# Patient Record
Sex: Female | Born: 1945 | Race: White | Hispanic: No | Smoking: Former smoker
Health system: Southern US, Community
[De-identification: ages and names within clinical notes are randomized; demographics above are authoritative.]

## PROBLEM LIST (undated history)

## (undated) DIAGNOSIS — J45909 Unspecified asthma, uncomplicated: Secondary | ICD-10-CM

## (undated) DIAGNOSIS — I5189 Other ill-defined heart diseases: Secondary | ICD-10-CM

## (undated) DIAGNOSIS — M810 Age-related osteoporosis without current pathological fracture: Secondary | ICD-10-CM

## (undated) DIAGNOSIS — J9601 Acute respiratory failure with hypoxia: Secondary | ICD-10-CM

## (undated) DIAGNOSIS — J449 Chronic obstructive pulmonary disease, unspecified: Secondary | ICD-10-CM

## (undated) HISTORY — PX: VAGINAL HYSTERECTOMY: SUR661

## (undated) HISTORY — DX: Chronic obstructive pulmonary disease, unspecified: J44.9

---

## 2015-06-24 ENCOUNTER — Ambulatory Visit (INDEPENDENT_AMBULATORY_CARE_PROVIDER_SITE_OTHER): Payer: Medicare Other | Admitting: Pulmonary Disease

## 2015-06-24 ENCOUNTER — Encounter: Payer: Self-pay | Admitting: Pulmonary Disease

## 2015-06-24 VITALS — BP 142/70 | HR 90 | Temp 99.5°F | Ht 63.0 in | Wt 140.6 lb

## 2015-06-24 DIAGNOSIS — Z720 Tobacco use: Secondary | ICD-10-CM

## 2015-06-24 DIAGNOSIS — J449 Chronic obstructive pulmonary disease, unspecified: Secondary | ICD-10-CM | POA: Insufficient documentation

## 2015-06-24 DIAGNOSIS — F17210 Nicotine dependence, cigarettes, uncomplicated: Secondary | ICD-10-CM

## 2015-06-24 DIAGNOSIS — R0600 Dyspnea, unspecified: Secondary | ICD-10-CM

## 2015-06-24 MED ORDER — UMECLIDINIUM BROMIDE 62.5 MCG/INH IN AEPB: 1.0000 | INHALATION_SPRAY | Freq: Every day | RESPIRATORY_TRACT | Status: DC

## 2015-06-24 MED ORDER — UMECLIDINIUM BROMIDE 62.5 MCG/INH IN AEPB: 1.0000 | INHALATION_SPRAY | Freq: Every day | RESPIRATORY_TRACT | Status: AC

## 2015-06-24 NOTE — Patient Instructions (Signed)
[name]-- it was great meeting you today...  Today we reviewed the available records, including your recent CXR report from 06/09/15...  We also checked a pulmonary function test and an ambulatory oximetry test...    They show rather severe obstructive lung disease (a combination of chronic bronchitis and emphysema)  As we discussed> clearly THE MOST IMPORTANT THING IS TO QUIT SMOKING COMPLETELY!!!    Try the OTC nicotine replacement options-- gum, lozenges, patches, etc...    Let me know if you need to try the prescription Chantix medication...  Based on your data> I recommend:    Continue the SYMBICORT160- 2 sprays twice daily...    Add the new INCRUSE one inhalation daily...    Continue the DUONEB medication via Nebulizer 2-3 times daily...  For the AM cough & congestion>    I rec that you start OTC MUCINEX  tabs- take 2 tabs twice daily w/ fluids...  Stay as active as possible, the exercise is GOOD!  Call for any questions...  Let's plan a follow up visit in 2-72mo, sooner if needed for acute problems.Marland KitchenMarland Kitchen

## 2015-06-30 ENCOUNTER — Encounter: Payer: Self-pay | Admitting: Pulmonary Disease

## 2015-06-30 NOTE — Progress Notes (Signed)
Subjective:     Patient ID: Leslie Porter, female   DOB: 03/23/1946, 70 y.o.   MRN: [id number]  HPI ~  June 24, 2015:  Initial pulmonary consult by SN>    70 y/o WF [facility] for a pulmonary evaluation due to COPD>      Dennie Bibleat has been a smoker- starting at age 70, smoked for 55+years now, up to 1+ppd, and most recently down to 1/2ppd for the past few yrs (this adds up to a 60+ pack-year smoking history);  She denies prev history of lung problems- no asthma as a child, rare bout of bronchitis requiring antibiotics in adulthood but no pneumonia, no TB or known exposure, and no known toxic exposures occupationally or otherwise (she still does part-time merchandizing/ delivery);  Dennie Bibleat has noticed SOB for several years along w/ a morning cough, beige sputum, and DOE after min activity and some ADLs (eg-stairs);  She was diagnosed w/ COPD and placed on NEBS and inhalers (currently Symbicort & AlbutHFA).Marland Kitchen.Marland Kitchen.      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   EXAM shows Afeb, VSS, O2sat=92% on RA at rest;  HEENT- neg, mallampati2;  Chest- decr BS bilat w/o w/r/r heard;  Heart- RR w/o m/r/g;  Abd- soft, nontender, neg;  Ext- neg w/o c/c/e;  Neuro- intact...   CXR 06/09/15 at [facility]>  Norm heart size, hyperinflation/ clear lungs/ no nodules, adenopathy, etc/ stable anterior wedging of mid-thoracic vertebral bodies...   Spirometry 06/24/15>  FVC=1.90 (70%), FEV1=0.76 (36%), %1sec=40, mid-flows reduced at 18% predicted;  This is shows a severe obstructive ventilatory defect c/w GOLD Stage 3 COPD  Ambulatory Oximetry 06/24/15>  O2sat=96% on RA at rest  w/ pulse=78/min;  She ambulated in office a total of 3 laps w/ lowest O2sat=89% w/ pulse=102/min...  IMP/PLAN >>    COPD-- suspect emphysema> chronic bronchitis, could eval further w/ FullPFTs & check lung volumes and DLCO, consider CT Chest as well (would serve for further eval for centrilobular emphysema as well as screening for high risk smoker);  In the meanwhile we need to add a long acting anticholinergic (INCRUSE one inhalation daily) to her ICS/LABA (Symbicort160-2spBid) and NEB w/ Duoneb Tid...    Cigarette smoker-- Dennie Bibleat understands that the MOST IMPORTANT thing to do is QUIT SMOKING COMPLETELY; I offered Chantix, encouraged Nicotine replacement options, etc...    Osteoporosis w/ wedging of mult Tspine vertebral bodies and kyphosis-- I encouraged her to get a baseline BMD & proceed on w/ treatment for this progressive 7 sometimes very painful, condition...     Other medical issues per DrBadger & his medical team-- she needs to be sure she is up-to-date on all needed vaccinations (including Pneumovax23 & Prevnar-13)...    Past Medical History  Diagnosis Date  . COPD (chronic obstructive pulmonary disease) [facility](HCC)     Past Surgical History  Procedure Laterality Date  . Vaginal hysterectomy      Outpatient Encounter Prescriptions as of 06/24/2015  Medication Sig  . albuterol (PROVENTIL HFA;VENTOLIN HFA) 108 (90 Base) MCG/ACT inhaler Inhale 1-2 puffs into the lungs every 6 (six) hours as needed for wheezing or shortness of breath.  .Marland Kitchen  budesonide-formoterol (SYMBICORT) 160-4.5 MCG/ACT inhaler Inhale 2 puffs into the lungs 2 (two) times daily.  . Calcium Carbonate (CALCIUM 600 PO) Take 1 tablet by mouth daily.  Marland Kitchen ipratropium-albuterol (DUONEB) 0.5-2.5 (3) MG/3ML SOLN Take 3 mLs by nebulization 2 (two) times daily.  . Multiple Vitamin (MULTIVITAMIN) capsule Take 1 capsule by mouth daily.  . vitamin E 400 UNIT capsule Take 400 Units by mouth daily.    ALLERGIES>  No known drug  allergies...   Immunization History  Administered Date(s) Administered  . Influenza,inj,Quad PF,36+ Mos 02/03/2015  . Zoster 02/27/2014    Family History  Problem Relation Age of Onset  . Heart attack Father   . Heart disease Mother     Social History   Social History  . Marital Status: Divorced    Spouse Name: N/A  . Number of Children: N/A  . Years of Education: N/A   Occupational History  . merchandising     [employer]   Social History Main Topics  . Smoking status: Current Every Day Smoker -- 1.00 packs/day for 50 years    Types: Cigarettes  . Smokeless tobacco: Never Used  . Alcohol Use: 0.0 oz/week    0 Standard drinks or equivalent per week     Comment: Occasional  . Drug Use: No  . Sexual Activity: Not on file   Other Topics Concern  . Not on file   Social History Narrative    Current Medications, Allergies, Past Medical History, Past Surgical History, Family History, and Social History were reviewed in [employer] record.   Review of Systems             All symptoms NEG except where BOLDED >>  Constitutional:  F/C/S, fatigue, anorexia, unexpected weight change. HEENT:  HA, visual changes, hearing loss, earache, nasal symptoms, sore throat, mouth sores, hoarseness. Resp:  cough, sputum, hemoptysis; SOB, tightness, wheezing. Cardio:  CP, palpit, DOE, orthopnea, edema. GI:  N/V/D/C, blood in stool; reflux, abd pain, distention, gas. GU:  dysuria, freq, urgency, hematuria, flank pain, voiding difficulty. MS:  joint pain, swelling, tenderness, decr ROM; neck pain, back pain, etc. Neuro:  HA, tremors, seizures, dizziness, syncope, weakness, numbness, gait abn. Skin:  suspicious lesions or skin rash. Heme:  adenopathy, bruising, bleeding. Psyche:  confusion, agitation, sleep disturbance, hallucinations, anxiety, depression suicidal.   Objective:   Physical Exam       Vital Signs:  Reviewed...  General:  WD, WN, 70  y/o WF in NAD; alert & oriented; pleasant & cooperative... HEENT:  [place]/AT; Conjunctiva- pink, Sclera- nonicteric, EOM-wnl, PERRLA, EACs-clear, TMs-wnl; NOSE-clear; THROAT-clear & wnl. Neck:  Supple w/ fair ROM; no JVD; normal carotid impulses w/o bruits; no thyromegaly or nodules palpated; no lymphadenopathy. Chest:  Decr BS bilat & can't augment the BS voluntarily; no wheezing/ rales/ or rhonchi today... Heart:  Regular Rhythm; norm S1 & S2 without murmurs, rubs, or gallops detected. Abdomen:  Soft & nontender- no guarding or rebound; normal bowel sounds; no organomegaly or masses palpated. Ext:  Normal ROM; without deformities, +arthritic changes; no varicose veins, +venous insuffic, no edema;  Pulses intact w/o bruits. Neuro:  CNs II-XII intact; motor testing normal; sensory testing normal; gait normal & balance OK. Derm:  No lesions noted; no rash etc. Lymph:  No cervical, supraclavicular, axillary, or inguinal adenopathy palpated.   Assessment:      IMP/PLAN >>    COPD-- suspect emphysema> chronic bronchitis, could eval further w/ FullPFTs & check lung volumes and  DLCO, consider CT Chest as well (would serve for further eval for centrilobular emphysema as well as screening for high risk smoker);  In the meanwhile we need to add a long acting anticholinergic (INCRUSE one inhalation daily) to her ICS/LABA (Symbicort160-2spBid) and NEB w/ Duoneb Tid...    Cigarette smoker-- [name] understands that the MOST IMPORTANT thing to do is QUIT SMOKING COMPLETELY; I offered Chantix, encouraged Nicotine replacement options, etc...    Osteoporosis w/ wedging of mult Tspine vertebral bodies and kyphosis-- I encouraged her to get a baseline BMD & proceed on w/ treatment for this progressive 7 sometimes very painful, condition...     Other medical issues per DrBadger & his medical team-- she needs to be sure she is up-to-date on all needed vaccinations (including Pneumovax23 & Prevnar-13)...     Plan:      Patient's Medications  New Prescriptions   UMECLIDINIUM BROMIDE (INCRUSE ELLIPTA) 62.5 MCG/INH AEPB    Inhale 1 puff into the lungs daily.  Previous Medications   ALBUTEROL (PROVENTIL HFA;VENTOLIN HFA) 108 (90 BASE) MCG/ACT INHALER    Inhale 1-2 puffs into the lungs every 6 (six) hours as needed for wheezing or shortness of breath.   BUDESONIDE-FORMOTEROL (SYMBICORT) 160-4.5 MCG/ACT INHALER    Inhale 2 puffs into the lungs 2 (two) times daily.   CALCIUM CARBONATE (CALCIUM 600 PO)    Take 1 tablet by mouth daily.   IPRATROPIUM-ALBUTEROL (DUONEB) 0.5-2.5 (3) MG/3ML SOLN    Take 3 mLs by nebulization 2 (two) times daily.   MULTIPLE VITAMIN (MULTIVITAMIN) CAPSULE    Take 1 capsule by mouth daily.   VITAMIN E 400 UNIT CAPSULE    Take 400 Units by mouth daily.  Modified Medications   No medications on file  Discontinued Medications   No medications on file

## 2015-07-23 ENCOUNTER — Telehealth: Payer: Self-pay | Admitting: Pulmonary Disease

## 2015-07-23 MED ORDER — PREDNISONE 20 MG PO TABS: ORAL_TABLET | ORAL | Status: DC

## 2015-07-23 MED ORDER — LEVOFLOXACIN 500 MG PO TABS: 500.0000 mg | ORAL_TABLET | Freq: Every day | ORAL | Status: DC

## 2015-07-23 NOTE — Telephone Encounter (Signed)
Last ov with SN on 06/24/15  Patient Instructions       Dennie Bibleat-- it was great meeting you today...  Today we reviewed the available records, including your recent CXR report from 06/09/15...  We also checked a pulmonary function test and an ambulatory oximetry test...    They show rather severe obstructive lung disease (a combination of chronic bronchitis and emphysema)  As we discussed> clearly THE MOST IMPORTANT THING IS TO QUIT SMOKING COMPLETELY!!!    Try the OTC nicotine replacement options-- gum, lozenges, patches, etc...    Let me know if you need to try the prescription Chantix medication...  Based on your data> I recommend:    Continue the SYMBICORT160- 2 sprays twice daily...    Add the new INCRUSE one inhalation daily...    Continue the DUONEB medication via Nebulizer 2-3 times daily...  For the AM cough & congestion>    I rec that you start OTC MUCINEX 600mg  tabs- take 2 tabs twice daily w/ fluids...  Stay as active as possible, the exercise is GOOD!  Call for any questions...  Let's plan a follow up visit in 2-2456mo, sooner if needed for acute problems...   Called and spoke with pt. Pt states that she saw SN on 06/24/15. Since that ov her symptoms have worsened. She c/o increased SOB, wheezing, chest congestion, sinus drainage, and a prod cough with clear mucus for the last two days. She denies any fever, chest tightness, sinus congestion, nausea or vomiting. Pt is requesting a message be sent to SN for his recommendations. I explained to her the message will be sent and once we receive his recs we will return her call. She voiced understanding and had no further questions.   SN please advise   Current outpatient prescriptions:  .  albuterol (PROVENTIL HFA;VENTOLIN HFA) 108 (90 Base) MCG/ACT inhaler, Inhale 1-2 puffs into the lungs every 6 (six) hours as needed for wheezing or shortness of breath., Disp: , Rfl:  .  budesonide-formoterol (SYMBICORT) 160-4.5 MCG/ACT inhaler,  Inhale 2 puffs into the lungs 2 (two) times daily., Disp: , Rfl:  .  Calcium Carbonate (CALCIUM 600 PO), Take 1 tablet by mouth daily., Disp: , Rfl:  .  ipratropium-albuterol (DUONEB) 0.5-2.5 (3) MG/3ML SOLN, Take 3 mLs by nebulization 2 (two) times daily., Disp: , Rfl:  .  Multiple Vitamin (MULTIVITAMIN) capsule, Take 1 capsule by mouth daily., Disp: , Rfl:  .  umeclidinium bromide (INCRUSE ELLIPTA) 62.5 MCG/INH AEPB, Inhale 1 puff into the lungs daily., Disp: 1 each, Rfl: 11 .  vitamin E 400 UNIT capsule, Take 400 Units by mouth daily., Disp: , Rfl:    Not on File

## 2015-07-23 NOTE — Telephone Encounter (Signed)
Per SN: Levaquin 500mg  #7 QD, prednisone 20mg  #20, 1po BID x4 days, 1po QD x4 days, then 1/2 QD till gone.  How's she doing on the smoking?  Called spoke with patient and discussed SN's recs as stated above.  Pt voiced her understanding and [facility] [place]  Pt stated that she is still working on decreasing smoking and that she is down to 0.25ppd.    Rx's sent to pharmacy SN notified of pt's attempts at smoking cessation Nothing further needed; will sign off.

## 2015-07-25 ENCOUNTER — Emergency Department (HOSPITAL_COMMUNITY): Payer: Medicare Other

## 2015-07-25 ENCOUNTER — Encounter (HOSPITAL_COMMUNITY): Payer: Self-pay | Admitting: Emergency Medicine

## 2015-07-25 ENCOUNTER — Emergency Department (HOSPITAL_COMMUNITY)
Admission: EM | Admit: 2015-07-25 | Discharge: 2015-07-25 | Disposition: A | Discharge status: Home / Self Care | Payer: Medicare Other | Attending: Emergency Medicine | Admitting: Emergency Medicine

## 2015-07-25 DIAGNOSIS — R42 Dizziness and giddiness: Secondary | ICD-10-CM | POA: Diagnosis not present

## 2015-07-25 DIAGNOSIS — X58XXXA Exposure to other specified factors, initial encounter: Secondary | ICD-10-CM | POA: Insufficient documentation

## 2015-07-25 DIAGNOSIS — T50995A Adverse effect of other drugs, medicaments and biological substances, initial encounter: Secondary | ICD-10-CM | POA: Diagnosis not present

## 2015-07-25 DIAGNOSIS — J441 Chronic obstructive pulmonary disease with (acute) exacerbation: Secondary | ICD-10-CM | POA: Diagnosis not present

## 2015-07-25 DIAGNOSIS — R739 Hyperglycemia, unspecified: Secondary | ICD-10-CM | POA: Insufficient documentation

## 2015-07-25 DIAGNOSIS — Y9389 Activity, other specified: Secondary | ICD-10-CM | POA: Diagnosis not present

## 2015-07-25 DIAGNOSIS — Y998 Other external cause status: Secondary | ICD-10-CM | POA: Diagnosis not present

## 2015-07-25 DIAGNOSIS — Z792 Long term (current) use of antibiotics: Secondary | ICD-10-CM | POA: Diagnosis not present

## 2015-07-25 DIAGNOSIS — Z7951 Long term (current) use of inhaled steroids: Secondary | ICD-10-CM | POA: Insufficient documentation

## 2015-07-25 DIAGNOSIS — M810 Age-related osteoporosis without current pathological fracture: Secondary | ICD-10-CM | POA: Insufficient documentation

## 2015-07-25 DIAGNOSIS — Z79899 Other long term (current) drug therapy: Secondary | ICD-10-CM | POA: Insufficient documentation

## 2015-07-25 DIAGNOSIS — Y9289 Other specified places as the place of occurrence of the external cause: Secondary | ICD-10-CM | POA: Insufficient documentation

## 2015-07-25 DIAGNOSIS — F17210 Nicotine dependence, cigarettes, uncomplicated: Secondary | ICD-10-CM | POA: Insufficient documentation

## 2015-07-25 DIAGNOSIS — T7840XA Allergy, unspecified, initial encounter: Secondary | ICD-10-CM

## 2015-07-25 HISTORY — DX: Age-related osteoporosis without current pathological fracture: M81.0

## 2015-07-25 HISTORY — DX: Unspecified asthma, uncomplicated: J45.909

## 2015-07-25 LAB — CBC WITH DIFFERENTIAL/PLATELET
BASOS ABS: 0 10*3/uL (ref 0.0–0.1)
Basophils Relative: 0 %
EOS PCT: 0 %
Eosinophils Absolute: 0 10*3/uL (ref 0.0–0.7)
HCT: 42.7 % (ref 36.0–46.0)
Hemoglobin: 14.6 g/dL (ref 12.0–15.0)
LYMPHS PCT: 5 %
Lymphs Abs: 0.5 10*3/uL — ABNORMAL LOW (ref 0.7–4.0)
MCH: 33.6 pg (ref 26.0–34.0)
MCHC: 34.2 g/dL (ref 30.0–36.0)
MCV: 98.2 fL (ref 78.0–100.0)
MONO ABS: 0.3 10*3/uL (ref 0.1–1.0)
Monocytes Relative: 3 %
NEUTROS ABS: 8.6 10*3/uL — AB (ref 1.7–7.7)
NEUTROS PCT: 92 %
PLATELETS: 328 10*3/uL (ref 150–400)
RBC: 4.35 MIL/uL (ref 3.87–5.11)
RDW: 12.5 % (ref 11.5–15.5)
WBC: 9.4 10*3/uL (ref 4.0–10.5)

## 2015-07-25 LAB — BASIC METABOLIC PANEL
Anion gap: 11 (ref 5–15)
BUN: 18 mg/dL (ref 6–20)
CHLORIDE: 105 mmol/L (ref 101–111)
CO2: 28 mmol/L (ref 22–32)
CREATININE: 0.67 mg/dL (ref 0.44–1.00)
Calcium: 9.3 mg/dL (ref 8.9–10.3)
GFR calc non Af Amer: >60 mL/min (ref >60)
Glucose, Bld: 166 mg/dL — ABNORMAL HIGH (ref 65–99)
Potassium: 3.9 mmol/L (ref 3.5–5.1)
SODIUM: 144 mmol/L (ref 135–145)

## 2015-07-25 LAB — I-STAT TROPONIN, ED: Troponin i, poc: 0 ng/mL (ref 0.00–0.08)

## 2015-07-25 MED ORDER — EPINEPHRINE 0.3 MG/0.3ML IJ SOAJ: 0.3000 mg | Freq: Once | INTRAMUSCULAR | Status: DC

## 2015-07-25 NOTE — ED Provider Notes (Signed)
CSN: [id number]     Arrival date & time 07/25/15  1359 History   First MD Initiated Contact with Patient 07/25/15 1458     Chief Complaint  Patient presents with  . Allergic Reaction     (Consider location/radiation/quality/duration/timing/severity/associated sxs/prior Treatment) HPI Patient developed feeling of throat closing lightheadedness and tingling in both hands and tingling around her mouth at 9 AM today. She had been complaining of shortness of breath for the past 5 days, typical of her COPD was prescribed prednisone and Levaquin 4 days ago. From urgent care EMS was called. She was treated with a DuoNeb, EpiPen and Benadryl prior to coming here today. Presently her throat feels improved she denies any shortness of breath. She is no longer "dizzy" she does have some tingling in both hands though it is improving steadily with time. Past Medical History  Diagnosis Date  . COPD (chronic obstructive pulmonary disease) (HCC)   . Asthma   . Osteoporosis    Past Surgical History  Procedure Laterality Date  . Vaginal hysterectomy     Family History  Problem Relation Age of Onset  . Heart attack Father   . Heart disease Mother    Social History  Substance Use Topics  . Smoking status: Current Every Day Smoker -- 1.00 packs/day for 50 years    Types: Cigarettes  . Smokeless tobacco: Never Used  . Alcohol Use: 0.0 oz/week    0 Standard drinks or equivalent per week     Comment: Occasional   OB History    No data available     Review of Systems  Constitutional: Negative.   HENT: Negative.        Feeling of throat closing  Respiratory: Positive for shortness of breath.   Cardiovascular: Negative.   Gastrointestinal: Negative.   Musculoskeletal: Negative.   Skin: Negative.   Neurological: Positive for light-headedness.       Tingling around mouth and and hands  Psychiatric/Behavioral: Negative.   All other systems reviewed and are negative.     Allergies   Levofloxacin  Home Medications   Prior to Admission medications   Medication Sig Start Date End Date Taking? Authorizing Provider  albuterol (PROVENTIL HFA;VENTOLIN HFA) 108 (90 Base) MCG/ACT inhaler Inhale 1-2 puffs into the lungs every 6 (six) hours as needed for wheezing or shortness of breath.    Historical Provider, MD  budesonide-formoterol (SYMBICORT) 160-4.5 MCG/ACT inhaler Inhale 2 puffs into the lungs 2 (two) times daily.    Historical Provider, MD  Calcium Carbonate (CALCIUM 600 PO) Take 1 tablet by mouth daily.    Historical Provider, MD  ipratropium-albuterol (DUONEB) 0.5-2.5 (3) MG/3ML SOLN Take 3 mLs by nebulization 2 (two) times daily.    Historical Provider, MD  levofloxacin (LEVAQUIN) 500 MG tablet Take 1 tablet (500 mg total) by mouth daily. 07/23/15   Michele McalpineScott M Nadel, MD  Multiple Vitamin (MULTIVITAMIN) capsule Take 1 capsule by mouth daily.    Historical Provider, MD  predniSONE (DELTASONE) 20 MG tablet Take 1 tablet by mouth twice daily x4 days, then 1 tab once daily x4 days, then 1/2 tab daily until gone. 07/23/15   Michele McalpineScott M Nadel, MD  umeclidinium bromide (INCRUSE ELLIPTA) 62.5 MCG/INH AEPB Inhale 1 puff into the lungs daily. 06/24/15   Michele McalpineScott M Nadel, MD  vitamin E 400 UNIT capsule Take 400 Units by mouth daily.    Historical Provider, MD   BP 141/64 mmHg  Pulse 97  Temp(Src) 98 F (36.7 C) (Oral)  Resp 15  Ht  (1.575 m)  Wt 140 lb (63.504 kg)  BMI 25.60 kg/m2  SpO2 93% Physical Exam  Constitutional: She appears well-developed and well-nourished. No distress.  HENT:  Head: Normocephalic and atraumatic.  Mouth/Throat: Oropharynx is clear and moist.  Eyes: Conjunctivae are normal. Pupils are equal, round, and reactive to light.  Neck: Neck supple. No tracheal deviation present. No thyromegaly present.  Cardiovascular: Normal rate and regular rhythm.   No murmur heard. Pulmonary/Chest: Effort normal. No respiratory distress.  Minimal occasional end expiratory  wheeze  Abdominal: Soft. Bowel sounds are normal. She exhibits no distension. There is no tenderness.  Musculoskeletal: Normal range of motion. She exhibits no edema or tenderness.  Neurological: She is alert. Coordination normal.  Skin: Skin is warm and dry. No rash noted.  Psychiatric: She has a normal mood and affect.  Nursing note and vitals reviewed.   ED Course  Procedures (including critical care time) Labs Review Labs Reviewed - No data to display  Imaging Review No results found. I have personally reviewed and evaluated these images and lab results as part of my medical decision-making.   EKG Interpretation   Date/Time:  Saturday July 25 2015 15:33:29 EDT Ventricular Rate:  97 PR Interval:  147 QRS Duration: 85 QT Interval:  357 QTC Calculation: 453 R Axis:   88 Text Interpretation:  Sinus rhythm Borderline right axis deviation No old  tracing to compare Confirmed by Ethelda Chick  MD, Arietta Eisenstein [phone]) on 07/25/2015  4:25:26 PM     Chest x-ray viewed by me Results for orders placed or performed during the hospital encounter of 07/25/15  Basic metabolic panel  Result Value Ref Range   Sodium 144 135 - 145 mmol/L   Potassium 3.9 3.5 - 5.1 mmol/L   Chloride 105 101 - 111 mmol/L   CO2 28 22 - 32 mmol/L   Glucose, Bld 166 (H) 65 - 99 mg/dL   BUN 18 6 - 20 mg/dL   Creatinine, Ser 9.52 0.44 - 1.00 mg/dL   Calcium 9.3 8.9 - 84.1 mg/dL   GFR calc non Af Amer >60 >60 mL/min   GFR calc Af Amer >60 >60 mL/min   Anion gap 11 5 - 15  CBC with Differential/Platelet  Result Value Ref Range   WBC 9.4 4.0 - 10.5 K/uL   RBC 4.35 3.87 - 5.11 MIL/uL   Hemoglobin 14.6 12.0 - 15.0 g/dL   HCT 32.4 40.1 - 02.7 %   MCV 98.2 78.0 - 100.0 fL   MCH 33.6 26.0 - 34.0 pg   MCHC 34.2 30.0 - 36.0 g/dL   RDW 25.3 66.4 - 40.3 %   Platelets 328 150 - 400 K/uL   Neutrophils Relative % 92 %   Neutro Abs 8.6 (H) 1.7 - 7.7 K/uL   Lymphocytes Relative 5 %   Lymphs Abs 0.5 (L) 0.7 - 4.0 K/uL    Monocytes Relative 3 %   Monocytes Absolute 0.3 0.1 - 1.0 K/uL   Eosinophils Relative 0 %   Eosinophils Absolute 0.0 0.0 - 0.7 K/uL   Basophils Relative 0 %   Basophils Absolute 0.0 0.0 - 0.1 K/uL  I-stat troponin, ED  Result Value Ref Range   Troponin i, poc 0.00 0.00 - 0.08 ng/mL   Comment 3           Dg Chest 2 View  07/25/2015  CLINICAL DATA:  70 year old female with acute shortness of breath today. EXAM: CHEST  2 VIEW  COMPARISON:  None. FINDINGS: The cardiomediastinal silhouette is unremarkable. COPD/emphysema identified. There is no evidence of focal airspace disease, pulmonary edema, suspicious pulmonary nodule/mass, pleural effusion, or pneumothorax. 30% anterior wedge compression fractures of T6 and T7 are age indeterminate but probably remote. IMPRESSION: COPD/emphysema without evidence acute cardiopulmonary disease. Anterior wedge compression fractures of T6 and T7-likely remote but correlate with any pain. Electronically Signed   By: Harmon Pier M.D.   On: 07/25/2015 15:30    4:45 PM patient is able to ambulate in the ED without dyspnea or dizziness. She feels much improved and ready for discharge. Her pulse oximetry is 94% on room air.. Counseled patient for 5 minutes on smoking cessation MDM  Strongly doubt acute thoracic compression fracture. Patient has no back pain. Patient with suspected allergic reaction likely candidate is Levaquin which she started today. She does not need an antibiotic normal chest x-ray  continue prednisone. Prescription EpiPen Final diagnoses:  None   Diagnosis #1allergic reaction to medication #2 hyperglycemia #3 tobacco abuse     Doug Sou, MD 07/25/15 1656

## 2015-07-25 NOTE — ED Notes (Signed)
Pt denies SOB, states her breathing feels normal at this time.

## 2015-07-25 NOTE — ED Notes (Signed)
Bed: WA13 Expected date:  Expected time:  Means of arrival:  Comments: Allergic reaction 

## 2015-07-25 NOTE — Discharge Instructions (Signed)
Allergies Stop Levaquin (levofloxicin) and tell all of your doctors he had an allergic reaction to this medication. If your symptoms recur administer the EpiPen and call 911. Your blood sugar today was 166 which is mildly elevated. Elevated blood sugar may be secondary to taking prednisone. Ask your doctor to check a test known as hemoglobin A1c to check you for diabetes .Ask your primary care doctor to help you to stop smoking. An allergy is an abnormal reaction to a substance by the body's defense system (immune system). Allergies can develop at any age. WHAT CAUSES ALLERGIES? An allergic reaction happens when the immune system mistakenly reacts to a normally harmless substance, called an allergen, as if it were harmful. The immune system releases antibodies to fight the substance. Antibodies eventually release a chemical called histamine into the bloodstream. The release of histamine is meant to protect the body from infection, but it also causes discomfort. An allergic reaction can be triggered by:  Eating an allergen.  Inhaling an allergen.  Touching an allergen. WHAT TYPES OF ALLERGIES ARE THERE? There are many types of allergies. Common types include:  Seasonal allergies. People with this type of allergy are usually allergic to substances that are only present during certain seasons, such as molds and pollens.  Food allergies.  Drug allergies.  Insect allergies.  Animal dander allergies. WHAT ARE SYMPTOMS OF ALLERGIES? Possible allergy symptoms include:  Swelling of the lips, face, tongue, mouth, or throat.  Sneezing, coughing, or wheezing.  Nasal congestion.  Tingling in the mouth.  Rash.  Itching.  Itchy, red, swollen areas of skin (hives).  Watery eyes.  Vomiting.  Diarrhea.  Dizziness.  Lightheadedness.  Fainting.  Trouble breathing or swallowing.  Chest tightness.  Rapid heartbeat. HOW ARE ALLERGIES DIAGNOSED? Allergies are diagnosed with a  medical and family history and one or more of the following:  Skin tests.  Blood tests.  A food diary. A food diary is a record of all the foods and drinks you have in a day and of all the symptoms you experience.  The results of an elimination diet. An elimination diet involves eliminating foods from your diet and then adding them back in one by one to find out if a certain food causes an allergic reaction. HOW ARE ALLERGIES TREATED? There is no cure for allergies, but allergic reactions can be treated with medicine. Severe reactions usually need to be treated at a hospital. HOW CAN REACTIONS BE PREVENTED? The best way to prevent an allergic reaction is by avoiding the substance you are allergic to. Allergy shots and medicines can also help prevent reactions in some cases. People with severe allergic reactions may be able to prevent a life-threatening reaction called anaphylaxis with a medicine given right after exposure to the allergen.   This information is not intended to replace advice given to you by your health care provider. Make sure you discuss any questions you have with your health care provider.   Document Released: 07/05/2002 Document Revised: 05/02/2014 Document Reviewed: 01/21/2014 Elsevier Interactive Patient Education [employer]2016 [employer] Inc.

## 2015-07-25 NOTE — ED Notes (Signed)
Pt in no obvious distress, resting comfortably.

## 2015-07-25 NOTE — ED Notes (Signed)
Presented to her dr office c/o dizziness and feeling like her throat was closing up. Pt started predisone on Thursday and started taking levofloxacin today for respiratory issues. Hx of COPD. Per EMS pt received epi pen, duoneb, and 25mg  benadryl prior to their arrival. EMS vital 144/82 HR 102 RR 18 95% on o2 2lpm. Pt does not wear O2 at home. Pt o2 sats noted to be 85% on room air.

## 2015-07-25 NOTE — ED Notes (Signed)
She ambulates without difficulty and tells Dr. Shela CommonsJ that she is not having any particular difficulty breathing and essentially feels her norm.

## 2015-08-27 ENCOUNTER — Ambulatory Visit: Payer: Medicare Other | Admitting: Pulmonary Disease

## 2015-12-31 ENCOUNTER — Emergency Department (HOSPITAL_COMMUNITY): Payer: Medicare Other

## 2015-12-31 ENCOUNTER — Inpatient Hospital Stay (HOSPITAL_COMMUNITY)
Admission: EM | Admit: 2015-12-31 | Discharge: 2016-01-15 | DRG: 207 | Disposition: A | Discharge status: Home / Self Care | Payer: Medicare Other | Attending: Internal Medicine | Admitting: Internal Medicine

## 2015-12-31 ENCOUNTER — Encounter (HOSPITAL_COMMUNITY): Payer: Self-pay | Admitting: Emergency Medicine

## 2015-12-31 DIAGNOSIS — Z0189 Encounter for other specified special examinations: Secondary | ICD-10-CM

## 2015-12-31 DIAGNOSIS — J440 Chronic obstructive pulmonary disease with acute lower respiratory infection: Secondary | ICD-10-CM | POA: Diagnosis present

## 2015-12-31 DIAGNOSIS — J449 Chronic obstructive pulmonary disease, unspecified: Secondary | ICD-10-CM | POA: Diagnosis not present

## 2015-12-31 DIAGNOSIS — F419 Anxiety disorder, unspecified: Secondary | ICD-10-CM | POA: Diagnosis present

## 2015-12-31 DIAGNOSIS — R739 Hyperglycemia, unspecified: Secondary | ICD-10-CM | POA: Diagnosis not present

## 2015-12-31 DIAGNOSIS — E876 Hypokalemia: Secondary | ICD-10-CM | POA: Diagnosis not present

## 2015-12-31 DIAGNOSIS — R1312 Dysphagia, oropharyngeal phase: Secondary | ICD-10-CM

## 2015-12-31 DIAGNOSIS — J9601 Acute respiratory failure with hypoxia: Secondary | ICD-10-CM | POA: Diagnosis not present

## 2015-12-31 DIAGNOSIS — Z888 Allergy status to other drugs, medicaments and biological substances status: Secondary | ICD-10-CM

## 2015-12-31 DIAGNOSIS — T884XXA Failed or difficult intubation, initial encounter: Secondary | ICD-10-CM

## 2015-12-31 DIAGNOSIS — R000 Tachycardia, unspecified: Secondary | ICD-10-CM | POA: Diagnosis present

## 2015-12-31 DIAGNOSIS — Z515 Encounter for palliative care: Secondary | ICD-10-CM | POA: Diagnosis present

## 2015-12-31 DIAGNOSIS — F17210 Nicotine dependence, cigarettes, uncomplicated: Secondary | ICD-10-CM | POA: Diagnosis present

## 2015-12-31 DIAGNOSIS — I10 Essential (primary) hypertension: Secondary | ICD-10-CM | POA: Diagnosis not present

## 2015-12-31 DIAGNOSIS — J9622 Acute and chronic respiratory failure with hypercapnia: Principal | ICD-10-CM | POA: Diagnosis present

## 2015-12-31 DIAGNOSIS — G9341 Metabolic encephalopathy: Secondary | ICD-10-CM | POA: Diagnosis present

## 2015-12-31 DIAGNOSIS — R609 Edema, unspecified: Secondary | ICD-10-CM | POA: Diagnosis not present

## 2015-12-31 DIAGNOSIS — E878 Other disorders of electrolyte and fluid balance, not elsewhere classified: Secondary | ICD-10-CM | POA: Diagnosis present

## 2015-12-31 DIAGNOSIS — J9621 Acute and chronic respiratory failure with hypoxia: Secondary | ICD-10-CM | POA: Diagnosis present

## 2015-12-31 DIAGNOSIS — D7589 Other specified diseases of blood and blood-forming organs: Secondary | ICD-10-CM | POA: Diagnosis not present

## 2015-12-31 DIAGNOSIS — Z720 Tobacco use: Secondary | ICD-10-CM | POA: Diagnosis not present

## 2015-12-31 DIAGNOSIS — R451 Restlessness and agitation: Secondary | ICD-10-CM | POA: Diagnosis not present

## 2015-12-31 DIAGNOSIS — Y92239 Unspecified place in hospital as the place of occurrence of the external cause: Secondary | ICD-10-CM | POA: Diagnosis present

## 2015-12-31 DIAGNOSIS — M810 Age-related osteoporosis without current pathological fracture: Secondary | ICD-10-CM | POA: Diagnosis present

## 2015-12-31 DIAGNOSIS — J189 Pneumonia, unspecified organism: Secondary | ICD-10-CM | POA: Diagnosis present

## 2015-12-31 DIAGNOSIS — J441 Chronic obstructive pulmonary disease with (acute) exacerbation: Secondary | ICD-10-CM | POA: Diagnosis not present

## 2015-12-31 DIAGNOSIS — Z7189 Other specified counseling: Secondary | ICD-10-CM

## 2015-12-31 DIAGNOSIS — E871 Hypo-osmolality and hyponatremia: Secondary | ICD-10-CM | POA: Diagnosis present

## 2015-12-31 DIAGNOSIS — T8089XA Other complications following infusion, transfusion and therapeutic injection, initial encounter: Secondary | ICD-10-CM | POA: Diagnosis not present

## 2015-12-31 DIAGNOSIS — I959 Hypotension, unspecified: Secondary | ICD-10-CM | POA: Diagnosis not present

## 2015-12-31 DIAGNOSIS — Z7952 Long term (current) use of systemic steroids: Secondary | ICD-10-CM

## 2015-12-31 DIAGNOSIS — J9602 Acute respiratory failure with hypercapnia: Secondary | ICD-10-CM | POA: Diagnosis not present

## 2015-12-31 DIAGNOSIS — E860 Dehydration: Secondary | ICD-10-CM | POA: Diagnosis present

## 2015-12-31 DIAGNOSIS — Z7951 Long term (current) use of inhaled steroids: Secondary | ICD-10-CM

## 2015-12-31 DIAGNOSIS — E872 Acidosis: Secondary | ICD-10-CM | POA: Diagnosis present

## 2015-12-31 DIAGNOSIS — J9600 Acute respiratory failure, unspecified whether with hypoxia or hypercapnia: Secondary | ICD-10-CM

## 2015-12-31 DIAGNOSIS — Z66 Do not resuscitate: Secondary | ICD-10-CM | POA: Diagnosis present

## 2015-12-31 DIAGNOSIS — J9690 Respiratory failure, unspecified, unspecified whether with hypoxia or hypercapnia: Secondary | ICD-10-CM

## 2015-12-31 DIAGNOSIS — Z9981 Dependence on supplemental oxygen: Secondary | ICD-10-CM | POA: Diagnosis not present

## 2015-12-31 DIAGNOSIS — Y848 Other medical procedures as the cause of abnormal reaction of the patient, or of later complication, without mention of misadventure at the time of the procedure: Secondary | ICD-10-CM | POA: Diagnosis present

## 2015-12-31 DIAGNOSIS — J9691 Respiratory failure, unspecified with hypoxia: Secondary | ICD-10-CM

## 2015-12-31 DIAGNOSIS — Z79899 Other long term (current) drug therapy: Secondary | ICD-10-CM

## 2015-12-31 DIAGNOSIS — Z4659 Encounter for fitting and adjustment of other gastrointestinal appliance and device: Secondary | ICD-10-CM

## 2015-12-31 DIAGNOSIS — R0602 Shortness of breath: Secondary | ICD-10-CM | POA: Diagnosis present

## 2015-12-31 DIAGNOSIS — Z7983 Long term (current) use of bisphosphonates: Secondary | ICD-10-CM

## 2015-12-31 DIAGNOSIS — Z978 Presence of other specified devices: Secondary | ICD-10-CM

## 2015-12-31 DIAGNOSIS — Z8249 Family history of ischemic heart disease and other diseases of the circulatory system: Secondary | ICD-10-CM

## 2015-12-31 HISTORY — DX: Acute respiratory failure with hypoxia: J96.01

## 2015-12-31 LAB — BLOOD GAS, ARTERIAL
Acid-Base Excess: 4.3 mmol/L — ABNORMAL HIGH (ref 0.0–2.0)
BICARBONATE: 26.8 mmol/L (ref 20.0–28.0)
Drawn by: 23534
O2 CONTENT: 10 L/min
O2 SAT: 85.3 %
PO2 ART: 55.3 mmHg — AB (ref 83.0–108.0)
pCO2 arterial: 53.8 mmHg — ABNORMAL HIGH (ref 32.0–48.0)
pH, Arterial: 7.357 (ref 7.350–7.450)

## 2015-12-31 LAB — CBC WITH DIFFERENTIAL/PLATELET
BASOS PCT: 0 %
Basophils Absolute: 0 10*3/uL (ref 0.0–0.1)
EOS ABS: 0 10*3/uL (ref 0.0–0.7)
EOS PCT: 0 %
HCT: 44.7 % (ref 36.0–46.0)
Hemoglobin: 15 g/dL (ref 12.0–15.0)
LYMPHS ABS: 1.2 10*3/uL (ref 0.7–4.0)
Lymphocytes Relative: 7 %
MCH: 33.7 pg (ref 26.0–34.0)
MCHC: 33.6 g/dL (ref 30.0–36.0)
MCV: 100.4 fL — ABNORMAL HIGH (ref 78.0–100.0)
Monocytes Absolute: 1.2 10*3/uL — ABNORMAL HIGH (ref 0.1–1.0)
Monocytes Relative: 6 %
Neutro Abs: 15.9 10*3/uL — ABNORMAL HIGH (ref 1.7–7.7)
Neutrophils Relative %: 87 %
PLATELETS: 310 10*3/uL (ref 150–400)
RBC: 4.45 MIL/uL (ref 3.87–5.11)
RDW: 12.4 % (ref 11.5–15.5)
WBC: 18.3 10*3/uL — AB (ref 4.0–10.5)

## 2015-12-31 LAB — BASIC METABOLIC PANEL
Anion gap: 10 (ref 5–15)
BUN: 12 mg/dL (ref 6–20)
CALCIUM: 8.7 mg/dL — AB (ref 8.9–10.3)
CHLORIDE: 97 mmol/L — AB (ref 101–111)
CO2: 27 mmol/L (ref 22–32)
CREATININE: 0.46 mg/dL (ref 0.44–1.00)
GFR calc Af Amer: >60 mL/min (ref >60)
Glucose, Bld: 133 mg/dL — ABNORMAL HIGH (ref 65–99)
Potassium: 4 mmol/L (ref 3.5–5.1)
SODIUM: 134 mmol/L — AB (ref 135–145)

## 2015-12-31 MED ORDER — ADULT MULTIVITAMIN W/MINERALS CH
1.0000 | ORAL_TABLET | Freq: Every day | ORAL | Status: DC
  Administered 2016-01-02: 1 via ORAL
  Filled 2015-12-31: qty 1

## 2015-12-31 MED ORDER — IOPAMIDOL (ISOVUE-370) INJECTION 76%
100.0000 mL | Freq: Once | INTRAVENOUS | Status: AC | PRN
  Administered 2015-12-31: 100 mL via INTRAVENOUS

## 2015-12-31 MED ORDER — ONDANSETRON HCL 4 MG/2ML IJ SOLN: 4.0000 mg | Freq: Four times a day (QID) | INTRAMUSCULAR | Status: DC | PRN

## 2015-12-31 MED ORDER — UMECLIDINIUM BROMIDE 62.5 MCG/INH IN AEPB
1.0000 | INHALATION_SPRAY | Freq: Every day | RESPIRATORY_TRACT | Status: DC
  Filled 2015-12-31: qty 7

## 2015-12-31 MED ORDER — ACETAMINOPHEN 325 MG PO TABS
650.0000 mg | ORAL_TABLET | Freq: Four times a day (QID) | ORAL | Status: DC | PRN
  Filled 2015-12-31: qty 2

## 2015-12-31 MED ORDER — DEXTROSE 5 % IV SOLN
1.0000 g | INTRAVENOUS | Status: AC
  Administered 2015-12-31 – 2016-01-06 (×7): 1 g via INTRAVENOUS
  Filled 2015-12-31 (×9): qty 10

## 2015-12-31 MED ORDER — AZITHROMYCIN 500 MG IV SOLR
INTRAVENOUS | Status: AC
  Filled 2015-12-31: qty 500

## 2015-12-31 MED ORDER — LORAZEPAM 2 MG/ML IJ SOLN
1.0000 mg | INTRAMUSCULAR | Status: DC | PRN
  Administered 2015-12-31 – 2016-01-01 (×3): 1 mg via INTRAVENOUS
  Filled 2015-12-31 (×2): qty 1

## 2015-12-31 MED ORDER — SODIUM CHLORIDE 0.9 % IV SOLN
INTRAVENOUS | Status: DC
  Administered 2015-12-31: 22:00:00 via INTRAVENOUS

## 2015-12-31 MED ORDER — LORAZEPAM 2 MG/ML IJ SOLN
INTRAMUSCULAR | Status: AC
  Filled 2015-12-31: qty 1

## 2015-12-31 MED ORDER — GUAIFENESIN ER 600 MG PO TB12: 600.0000 mg | ORAL_TABLET | Freq: Two times a day (BID) | ORAL | Status: DC | PRN

## 2015-12-31 MED ORDER — IPRATROPIUM-ALBUTEROL 0.5-2.5 (3) MG/3ML IN SOLN
3.0000 mL | RESPIRATORY_TRACT | Status: DC | PRN
  Administered 2016-01-01 – 2016-01-03 (×2): 3 mL via RESPIRATORY_TRACT
  Filled 2015-12-31 (×2): qty 3

## 2015-12-31 MED ORDER — AZITHROMYCIN 500 MG IV SOLR
500.0000 mg | INTRAVENOUS | Status: AC
  Administered 2015-12-31 – 2016-01-07 (×7): 500 mg via INTRAVENOUS
  Filled 2015-12-31 (×10): qty 500

## 2015-12-31 MED ORDER — NICOTINE 21 MG/24HR TD PT24
21.0000 mg | MEDICATED_PATCH | Freq: Every day | TRANSDERMAL | Status: DC
  Administered 2015-12-31 – 2016-01-15 (×16): 21 mg via TRANSDERMAL
  Filled 2015-12-31 (×16): qty 1

## 2015-12-31 MED ORDER — HYDROCODONE-ACETAMINOPHEN 5-325 MG PO TABS: 1.0000 | ORAL_TABLET | ORAL | Status: DC | PRN

## 2015-12-31 MED ORDER — IPRATROPIUM-ALBUTEROL 0.5-2.5 (3) MG/3ML IN SOLN
3.0000 mL | Freq: Once | RESPIRATORY_TRACT | Status: AC
  Administered 2015-12-31: 3 mL via RESPIRATORY_TRACT
  Filled 2015-12-31: qty 3

## 2015-12-31 MED ORDER — MOMETASONE FURO-FORMOTEROL FUM 200-5 MCG/ACT IN AERO
2.0000 | INHALATION_SPRAY | Freq: Two times a day (BID) | RESPIRATORY_TRACT | Status: DC
  Filled 2015-12-31: qty 8.8

## 2015-12-31 MED ORDER — DEXTROSE 5 % IV SOLN
INTRAVENOUS | Status: AC
  Filled 2015-12-31: qty 10

## 2015-12-31 MED ORDER — ENOXAPARIN SODIUM 40 MG/0.4ML ~~LOC~~ SOLN
40.0000 mg | SUBCUTANEOUS | Status: DC
  Administered 2015-12-31 – 2016-01-14 (×15): 40 mg via SUBCUTANEOUS
  Filled 2015-12-31 (×15): qty 0.4

## 2015-12-31 MED ORDER — CALCIUM CARBONATE 1250 (500 CA) MG PO TABS
1250.0000 mg | ORAL_TABLET | Freq: Every day | ORAL | Status: DC
  Administered 2016-01-02: 1250 mg via ORAL
  Filled 2015-12-31 (×3): qty 1

## 2015-12-31 MED ORDER — METHYLPREDNISOLONE SODIUM SUCC 125 MG IJ SOLR
60.0000 mg | Freq: Four times a day (QID) | INTRAMUSCULAR | Status: DC
  Administered 2015-12-31 – 2016-01-01 (×2): 60 mg via INTRAVENOUS
  Filled 2015-12-31 (×2): qty 2

## 2015-12-31 NOTE — H&P (Signed)
History and Physical    Leslie Porter WGN:[id number] DOB: 19-Dec-1945 DOA: 12/31/2015  PCP: [name], MD   Patient coming from: Home  Chief Complaint: Respiratory distress   HPI: Leslie Porter is a 70 y.o. female with medical history significant for COPD, tobacco abuse, and osteoporosis who presents to the emergency department for evaluation of worsening respiratory distress. Patient reports pain in her usual state of health until she developed increased dyspnea with cough approximately one week ago. Her symptoms continued to worsen despite her increasingly frequent use of home nebulizer. She saw her PCP on 12/30/2015 for evaluation of these complaints and was given an IM dose of Rocephin, started on a prednisone taper, and started on 3 L per minute of supplemental oxygen. EMS was called out yesterday but she refused transport to the ED at that time. She saw her PCP again today and received a second IM dose of Rocephin. EMS was again called about this evening for worsening respiratory distress and found the patient to be tripoding on their arrival. She was treated with 125 mg of Solu-Medrol and a DuoNeb en route to the ED. She denies recent fevers or chills, but notes worsening cough, and describes a sensation of needing to cough up some phlegm, but being unable to. She denies any chest pain, palpitations, lower extremity edema, or orthopnea.  ED Course: Upon arrival to the ED, patient is found to be afebrile, saturating 87% on 7 L to lose supplemental oxygen, tachypneic in the high 20s, tachycardic in the mid 30s, and hypertensive to 170s/70s. EKG revealed a sinus tachycardia with rate 138, right axis deviation, and RSR'in V1. Chest x-ray was notable for a increased prominence at the right hilum concerning for possible mass or lymphadenopathy. Chemistry panel featured a mild hypochloremia and hyponatremia. CBC is most notable for a leukocytosis to 18,300 and ABG features a pH 7.36, pCO2 54,  and pO2 55. CTA PE study was performed and negative for PE, but notable for consolidation involving the right middle lobe with no definite obstructing mass identified. CT findings are consistent with underlying emphysema. Patient was treated with DuoNeb in the emergency department and placed on BiPAP. She remained hemodynamically stable but continues to be in significant respiratory distress and will be admitted to the stepdown unit for ongoing evaluation and management of COPD with acute exacerbation, acute respiratory failure, and possible CAP.  Review of Systems:  All other systems reviewed and apart from HPI, are negative.  Past Medical History:  Diagnosis Date  . Asthma   . COPD (chronic obstructive pulmonary disease) (HCC)   . Osteoporosis     Past Surgical History:  Procedure Laterality Date  . VAGINAL HYSTERECTOMY       reports that she has been smoking Cigarettes.  She has a 50.00 pack-year smoking history. She has never used smokeless tobacco. She reports that she drinks alcohol. She reports that she does not use drugs.  Allergies  Allergen Reactions  . Levofloxacin Shortness Of Breath    Family History  Problem Relation Age of Onset  . Heart attack Father   . Heart disease Mother      Prior to Admission medications   Medication Sig Start Date End Date Taking? Authorizing Provider  albuterol (PROVENTIL HFA;VENTOLIN HFA) 108 (90 Base) MCG/ACT inhaler Inhale 1-2 puffs into the lungs every 6 (six) hours as needed for wheezing or shortness of breath.   Yes Historical Provider, MD  alendronate (FOSAMAX) 70 MG tablet Take 70 mg by mouth  every Sunday.  12/29/15  Yes Historical Provider, MD  budesonide-formoterol (SYMBICORT) 160-4.5 MCG/ACT inhaler Inhale 2 puffs into the lungs 2 (two) times daily.   Yes Historical Provider, MD  Calcium Carbonate (CALCIUM 600 PO) Take 1 tablet by mouth daily.   Yes Historical Provider, MD  cefTRIAXone (ROCEPHIN) 10 g injection Inject into the  muscle once.  12/31/15 12/31/15 Yes Historical Provider, MD  ipratropium-albuterol (DUONEB) 0.5-2.5 (3) MG/3ML SOLN Take 3 mLs by nebulization 2 (two) times daily.   Yes Historical Provider, MD  Multiple Vitamin (MULTIVITAMIN) capsule Take 1 capsule by mouth daily.   Yes Historical Provider, MD  predniSONE (DELTASONE) 20 MG tablet Take 1 tablet by mouth twice daily x4 days, then 1 tab once daily x4 days, then 1/2 tab daily until gone. Patient taking differently: Take 40 mg by mouth daily. 5 day course starting on 12/30/2015 07/23/15  Yes Michele Mcalpine, MD  umeclidinium bromide (INCRUSE ELLIPTA) 62.5 MCG/INH AEPB Inhale 1 puff into the lungs daily. 06/24/15  Yes Michele Mcalpine, MD  vitamin E 400 UNIT capsule Take 400 Units by mouth daily.   Yes Historical Provider, MD  EPINEPHrine (EPIPEN 2-PAK) 0.3 mg/0.3 mL IJ SOAJ injection Inject 0.3 mLs (0.3 mg total) into the muscle once. 07/25/15   Doug Sou, MD    Physical Exam: Vitals:   12/31/15 2008 12/31/15 2017 12/31/15 2030 12/31/15 2100  BP: 122/67  119/64 133/63  Pulse: (!) 137  (!) 123 (!) 128  Resp: (!) 28  (!) 29 26  Temp:      TempSrc:      SpO2: 94%  98% 98%  Weight:  65.8 kg (145 lb)    Height:  5\' 3"  (1.6 m)        Constitutional: In acute respiratory distress with tachypnea and accessory muscle recruitment; on BiPAP Eyes: PERTLA, lids and conjunctivae normal ENMT: Mucous membranes are moist. Posterior pharynx clear of any exudate or lesions.   Neck: normal, supple, no masses, no thyromegaly Respiratory: Diminished bilaterally with b/l expiratory wheezes and central rhonchi. Increased WOB. No pallor or cyanosis.   Cardiovascular: Rate ~120 and regular. No extremity edema. No carotid bruits.   Abdomen: No distension, no tenderness, no masses palpated. Bowel sounds normal.  Musculoskeletal: no clubbing / cyanosis. No joint deformity upper and lower extremities. Normal muscle tone.  Skin: no significant rashes, lesions, ulcers. Warm, dry,  well-perfused. Neurologic: CN 2-12 grossly intact. Sensation intact, DTR normal. Strength 5/5 in all 4 limbs.  Psychiatric: Normal judgment and insight. Alert and oriented x 3. Normal mood and affect.     Labs on Admission: I have personally reviewed following labs and imaging studies  CBC:  Recent Labs Lab 12/31/15 1711  WBC 18.3*  NEUTROABS 15.9*  HGB 15.0  HCT 44.7  MCV 100.4*  PLT 310   Basic Metabolic Panel:  Recent Labs Lab 12/31/15 1711  NA 134*  K 4.0  CL 97*  CO2 27  GLUCOSE 133*  BUN 12  CREATININE 0.46  CALCIUM 8.7*   GFR: Estimated Creatinine Clearance: 58.9 mL/min (by C-G formula based on SCr of 0.8 mg/dL). Liver Function Tests: No results for input(s): AST, ALT, ALKPHOS, BILITOT, PROT, ALBUMIN in the last 168 hours. No results for input(s): LIPASE, AMYLASE in the last 168 hours. No results for input(s): AMMONIA in the last 168 hours. Coagulation Profile: No results for input(s): INR, PROTIME in the last 168 hours. Cardiac Enzymes: No results for input(s): CKTOTAL, CKMB, CKMBINDEX, TROPONINI in  the last 168 hours. BNP (last 3 results) No results for input(s): PROBNP in the last 8760 hours. HbA1C: No results for input(s): HGBA1C in the last 72 hours. CBG: No results for input(s): GLUCAP in the last 168 hours. Lipid Profile: No results for input(s): CHOL, HDL, LDLCALC, TRIG, CHOLHDL, LDLDIRECT in the last 72 hours. Thyroid Function Tests: No results for input(s): TSH, T4TOTAL, FREET4, T3FREE, THYROIDAB in the last 72 hours. Anemia Panel: No results for input(s): VITAMINB12, FOLATE, FERRITIN, TIBC, IRON, RETICCTPCT in the last 72 hours. Urine analysis: No results found for: COLORURINE, APPEARANCEUR, LABSPEC, PHURINE, GLUCOSEU, HGBUR, BILIRUBINUR, KETONESUR, PROTEINUR, UROBILINOGEN, NITRITE, LEUKOCYTESUR Sepsis Labs: @LABRCNTIP (procalcitonin:4,lacticidven:4) )No results found for this or any previous visit (from the past 240 hour(s)).    Radiological Exams on Admission: Ct Angio Chest Pe W Or Wo Contrast  Result Date: 12/31/2015 CLINICAL DATA:  70 year old female with dyspnea and tachycardia. Mass seen on chest radiograph. EXAM: CT ANGIOGRAPHY CHEST WITH CONTRAST TECHNIQUE: Multidetector CT imaging of the chest was performed using the standard protocol during bolus administration of intravenous contrast. Multiplanar CT image reconstructions and MIPs were obtained to evaluate the vascular anatomy. CONTRAST:  100 cc Isovue 370 COMPARISON:  Chest radiograph dated 12/31/2015 FINDINGS: Evaluation of this exam is limited due to respiratory motion artifact. There is emphysematous changes of the lungs. There is near complete consolidative changes of the medial segment of the right middle lobe, likely atelectasis versus infiltrate. The remainder of the lungs are clear. There is no pleural effusion or pneumothorax. The central airways are patent. There is atherosclerotic calcification of the thoracic aorta. There is no aneurysmal dilatation or evidence of dissection. The origins of the great vessels of the aortic arch appear patent. Evaluation of the pulmonary arteries is limited due to respiratory motion artifact and suboptimal opacification of the peripheral branches. No definite central pulmonary artery embolus identified. There is no cardiomegaly or pericardial effusion. There is coronary vascular calcification. A 10 mm hypodense structure in the right hilar region (series 6, image 71) may represent hilar tissue or a top-normal lymph node. Mildly enlarged lymph node noted anterior to the carina which demonstrates fatty hilum. The esophagus is grossly unremarkable. No thyroid nodule identified. There is no axillary adenopathy. The chest wall soft tissues appear unremarkable. The there is osteopenia with degenerative changes of the spine. Old-appearing T6 an T7 compression deformity with anterior wedging resulting in thoracic kyphosis. 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 No acute fracture identified. The visualized upper abdomen is unremarkable. Review of the MIP images confirms the above findings. IMPRESSION: No CT evidence of central pulmonary artery embolus. Segmental consolidative changes of the right middle lobe. No definite obstructing mass identified. Clinical correlation and follow-up recommended. Emphysema. Electronically Signed   By: Elgie CollardArash  Radparvar M.D.   On: 12/31/2015 20:11   Dg Chest Portable 1 View  Result Date: 12/31/2015 CLINICAL DATA:  Severe SOB today. Hx of asthma, COPD. Everyday smoker x1ppd x50 years. EXAM: PORTABLE CHEST 1 VIEW COMPARISON:  07/25/2015 FINDINGS: Atherosclerotic calcification of the aortic arch. Abnormal fullness of the right hilum. Suspected underlying emphysema. Interstitial accentuation in the lung bases. Mild biapical pleuroparenchymal scarring. Vague nodularity at the left lung base may relate to the adjacent rib the hand. Airway thickening is present, suggesting bronchitis or reactive airways disease. IMPRESSION: 1. Abnormal and increased prominence the right hilum. Hilar mass or adenopathy not excluded. CT chest recommended, with contrast if feasible. 2. Suspected emphysema. 3. Airway thickening is present, suggesting bronchitis or reactive airways disease.  Interstitial accentuation at the lung bases. Electronically Signed   By: Gaylyn Rong M.D.   On: 12/31/2015 18:40    EKG: Independently reviewed. Sinus tachycardia (rate 138), RAD, RSR' in V1  Assessment/Plan  1. Acute respiratory failure with hypoxia and hypercarbia  - Saturating only 87% initially despite 7 Lpm of supplemental oxygen  - CTA negative for PE, but notable for possible RML PNA vs atelectasis  - Treating acute COPD exacerbation and possible CAP as below    2. COPD with acute exacerbation  - Hx of emphysema and >60 pack-yr smoking hx  - Had not required supplemental O2 at home until the current illness and was started on 3 Lpm on 12/29/15    - Presents with severe dyspnea at rest, increased cough, but no production to cough (though pt feels she needs to cough something up) - Currently requiring BiPAP, will admit to stepdown unit and wean from BiPAP as able  - Continue systemic steroid with Solu-Medrol 60 mg IV q6h  - Continue DuoNeb q2h prn  - Continue her home inhalers, Symbicort and Incruse Ellipta  - Abx as below  - Smoking-cessation as below  - Continuous pulse oximetry with titration of FiO2 to maintain sat 89-95%   3. ?CAP  - PNA versus atelectasis noted in RML on CTA  - There is no fever; WBC elevated, though she has been on prednisone  - Given her critical illness, will cover with Rocephin and azithromycin  - Check sputum gram stain and culture; check urine for strep pneumo or legionella antigens   4. Current smoker  - Counseled toward cessation  - Nicotine patch provided - RN asked to provide smoking-cessation information prior to discharge   5. Hyponatremia, hypochloremia  - Mild, likely d/t dehydration in setting of acute respiratory failure and possible CAP - Providing a gentle IV hydration with NS overnight - Repeat chem panel in am     DVT prophylaxis: sq Lovenox Code Status: Full  Family Communication: Discussed with patient Disposition Plan: Admit to stepdown Consults called: None  Admission status: Inpatient    Briscoe Deutscher, MD [facility] Pager [phone]  If 7PM-7AM, please contact night-coverage [email or web address] Password [email or web address]  12/31/2015, 9:25 PM

## 2015-12-31 NOTE — Progress Notes (Signed)
Respiratory  Care Note: Started the patient on a breathing treatment per MD order. Prior to treatment the patient had very distant breath sounds (BS). BS improved post treatment and wheezes was then noted to indicate some increase of air flow. The patient also stated that " I feel like I have a little more air and that if I could just cough some stuff up ." Sat at the patient's bedside to monitor and to ensure that we do our very best to help her feel better and to help her through this. I also talked to her about the possible plans of treatment such as Continuous Aerosol Treat, ABG and/or the use of BIPAP and explain it's purpose and how it works. I will continue to monitor her closely.

## 2015-12-31 NOTE — ED Triage Notes (Signed)
Per EMS: Pt having difficulty breathing, 5 mg albuterol en route, solumedrol, pt 93% on NRB.  Pt was in tripod position prior to transport in respiratory distress. Pt called 911 yesterday for same but refused transfer to facility.  Pt speaking in full complete sentences at this time.  Pt has copd, she saw doctor today and received steroid injection.  Pt 94% on 3L 02 at this time.

## 2015-12-31 NOTE — ED Provider Notes (Signed)
[facility]Y DEPT Provider Note   CSN: [id number] Arrival date & time: 12/31/15  1656     History   Chief Complaint Chief Complaint  Patient presents with  . Shortness of Breath  Level V caveat, acuity of situation dyspnea  HPI Khiya Friese is a 70 y.o. female. Patient has become progressively more short of breath over the past 4-5 days. She feels this is exacerbation of her COPD. She's been seen by her primary care physician twice received 2 different injections of antibiotics and also started on prednisone. She is also been started on home oxygen 3 L nasal cannula over the past few days Breathing became worse this afternoon. She was treated by EMS with Solu-Medrol 125 mg IV and   nebulized treatment pain albuterol 5 mg and Atrovent 2.5 mg, and Place patient on nonrebreather mask with improvement of breathing. She denies chest pain denies fever denies cough no other associated symptoms HPI  Past Medical History:  Diagnosis Date  . Asthma   . COPD (chronic obstructive pulmonary disease) (HCC)   . Osteoporosis     Patient Active Problem List   Diagnosis Date Noted  . COPD mixed type (HCC) 06/24/2015  . Cigarette smoker 06/24/2015  . Dyspnea 06/24/2015    Past Surgical History:  Procedure Laterality Date  . VAGINAL HYSTERECTOMY      OB History    No data available       Home Medications    Prior to Admission medications   Medication Sig Start Date End Date Taking? Authorizing Provider  albuterol (PROVENTIL HFA;VENTOLIN HFA) 108 (90 Base) MCG/ACT inhaler Inhale 1-2 puffs into the lungs every 6 (six) hours as needed for wheezing or shortness of breath.    Historical Provider, MD  budesonide-formoterol (SYMBICORT) 160-4.5 MCG/ACT inhaler Inhale 2 puffs into the lungs 2 (two) times daily.    Historical Provider, MD  Calcium Carbonate (CALCIUM 600 PO) Take 1 tablet by mouth daily.    Historical Provider, MD  EPINEPHrine (EPIPEN 2-PAK) 0.3 mg/0.3 mL IJ SOAJ injection  Inject 0.3 mLs (0.3 mg total) into the muscle once. 07/25/15   Doug Sou, MD  ipratropium-albuterol (DUONEB) 0.5-2.5 (3) MG/3ML SOLN Take 3 mLs by nebulization 2 (two) times daily.    Historical Provider, MD  Multiple Vitamin (MULTIVITAMIN) capsule Take 1 capsule by mouth daily.    Historical Provider, MD  predniSONE (DELTASONE) 20 MG tablet Take 1 tablet by mouth twice daily x4 days, then 1 tab once daily x4 days, then 1/2 tab daily until gone. 07/23/15   Michele Mcalpine, MD  umeclidinium bromide (INCRUSE ELLIPTA) 62.5 MCG/INH AEPB Inhale 1 puff into the lungs daily. 06/24/15   Michele Mcalpine, MD  vitamin E 400 UNIT capsule Take 400 Units by mouth daily.    Historical Provider, MD    Family History Family History  Problem Relation Age of Onset  . Heart attack Father   . Heart disease Mother     Social History Social History  Substance Use Topics  . Smoking status: Current Every Day Smoker    Packs/day: 1.00    Years: 50.00    Types: Cigarettes  . Smokeless tobacco: Never Used  . Alcohol use 0.0 oz/week     Comment: Occasional     Allergies   Levofloxacin   Review of Systems Review of Systems  Unable to perform ROS: Acuity of condition  Respiratory: Positive for shortness of breath.   All other systems reviewed and are negative.  Physical Exam Updated Vital Signs BP 174/71 (BP Location: Right Arm)   Pulse (!) 135   Temp 99.6 F (37.6 C) (Rectal)   Resp (!) 27   SpO2 (!) 87%   Physical Exam  Constitutional: She appears well-developed and well-nourished. She appears distressed.  Moderate respiratory distress speaks inshort sentences  HENT:  Head: Normocephalic and atraumatic.  Eyes: Conjunctivae are normal. Pupils are equal, round, and reactive to light.  Neck: Neck supple. No tracheal deviation present. No thyromegaly present.  Cardiovascular: Normal rate and regular rhythm.   No murmur heard. Pulmonary/Chest: She is in respiratory distress.  diminished breath  sounds diffusely  Abdominal: Soft. Bowel sounds are normal. She exhibits no distension. There is no tenderness.  Musculoskeletal: Normal range of motion. She exhibits no edema or tenderness.  Neurological: She is alert. Coordination normal.  Skin: Skin is warm and dry. No rash noted.  Psychiatric: She has a normal mood and affect.  Nursing note and vitals reviewed.    ED Treatments / Results  Labs (all labs ordered are listed, but only abnormal results are displayed) Labs Reviewed  BASIC METABOLIC PANEL  CBC WITH DIFFERENTIAL/PLATELET    EKG  EKG Interpretation  Date/Time:  Thursday December 31 2015 17:03:46 EDT Ventricular Rate:  138 PR Interval:    QRS Duration: 88 QT Interval:  299 QTC Calculation: 453 R Axis:   95 Text Interpretation:  Sinus tachycardia Right axis deviation RSR' in V1 or V2, probably normal variant Artifact in lead(s) I II III aVR aVL aVF V1 V2 V3 V4 V5 V6 SINCE LAST TRACING HEART RATE HAS INCREASED Confirmed by Ethelda ChickJACUBOWITZ  MD, Latora Quarry [phone]([id number]) on 12/31/2015 5:21:14 PM     Chest x-ray viewed by me Results for orders placed or performed during the hospital encounter of 12/31/15  Basic metabolic panel  Result Value Ref Range   Sodium 134 (L) 135 - 145 mmol/L   Potassium 4.0 3.5 - 5.1 mmol/L   Chloride 97 (L) 101 - 111 mmol/L   CO2 27 22 - 32 mmol/L   Glucose, Bld 133 (H) 65 - 99 mg/dL   BUN 12 6 - 20 mg/dL   Creatinine, Ser 1.910.46 0.44 - 1.00 mg/dL   Calcium 8.7 (L) 8.9 - 10.3 mg/dL   GFR calc non Af Amer >60 >60 mL/min   GFR calc Af Amer >60 >60 mL/min   Anion gap 10 5 - 15  CBC with Differential/Platelet  Result Value Ref Range   WBC 18.3 (H) 4.0 - 10.5 K/uL   RBC 4.45 3.87 - 5.11 MIL/uL   Hemoglobin 15.0 12.0 - 15.0 g/dL   HCT 47.844.7 29.536.0 - 62.146.0 %   MCV 100.4 (H) 78.0 - 100.0 fL   MCH 33.7 26.0 - 34.0 pg   MCHC 33.6 30.0 - 36.0 g/dL   RDW 30.812.4 65.711.5 - 84.615.5 %   Platelets 310 150 - 400 K/uL   Neutrophils Relative % 87 %   Neutro Abs 15.9 (H) 1.7 -  7.7 K/uL   Lymphocytes Relative 7 %   Lymphs Abs 1.2 0.7 - 4.0 K/uL   Monocytes Relative 6 %   Monocytes Absolute 1.2 (H) 0.1 - 1.0 K/uL   Eosinophils Relative 0 %   Eosinophils Absolute 0.0 0.0 - 0.7 K/uL   Basophils Relative 0 %   Basophils Absolute 0.0 0.0 - 0.1 K/uL  Blood gas, arterial  Result Value Ref Range   O2 Content 10.0 L/min   Delivery systems NASAL CANNULA  pH, Arterial 7.357 7.350 - 7.450   pCO2 arterial 53.8 (H) 32.0 - 48.0 mmHg   pO2, Arterial 55.3 (L) 83.0 - 108.0 mmHg   Bicarbonate 26.8 20.0 - 28.0 mmol/L   Acid-Base Excess 4.3 (H) 0.0 - 2.0 mmol/L   O2 Saturation 85.3 %   Collection site RIGHT RADIAL    Drawn by [phone]    Sample type ARTERIAL    Allens test (pass/fail) PASS PASS   Ct Angio Chest Pe W Or Wo Contrast  Result Date: 12/31/2015 CLINICAL DATA:  70 year old female with dyspnea and tachycardia. Mass seen on chest radiograph. EXAM: CT ANGIOGRAPHY CHEST WITH CONTRAST TECHNIQUE: Multidetector CT imaging of the chest was performed using the standard protocol during bolus administration of intravenous contrast. Multiplanar CT image reconstructions and MIPs were obtained to evaluate the vascular anatomy. CONTRAST:  100 cc Isovue 370 COMPARISON:  Chest radiograph dated 12/31/2015 FINDINGS: Evaluation of this exam is limited due to respiratory motion artifact. There is emphysematous changes of the lungs. There is near complete consolidative changes of the medial segment of the right middle lobe, likely atelectasis versus infiltrate. The remainder of the lungs are clear. There is no pleural effusion or pneumothorax. The central airways are patent. There is atherosclerotic calcification of the thoracic aorta. There is no aneurysmal dilatation or evidence of dissection. The origins of the great vessels of the aortic arch appear patent. Evaluation of the pulmonary arteries is limited due to respiratory motion artifact and suboptimal opacification of the peripheral branches.  No definite central pulmonary artery embolus identified. There is no cardiomegaly or pericardial effusion. There is coronary vascular calcification. A 10 mm hypodense structure in the right hilar region (series 6, image 71) may represent hilar tissue or a top-normal lymph node. Mildly enlarged lymph node noted anterior to the carina which demonstrates fatty hilum. The esophagus is grossly unremarkable. No thyroid nodule identified. There is no axillary adenopathy. The chest wall soft tissues appear unremarkable. The there is osteopenia with degenerative changes of the spine. Old-appearing T6 an T7 compression deformity with anterior wedging resulting in thoracic kyphosis. 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 No acute fracture identified. The visualized upper abdomen is unremarkable. Review of the MIP images confirms the above findings. IMPRESSION: No CT evidence of central pulmonary artery embolus. Segmental consolidative changes of the right middle lobe. No definite obstructing mass identified. Clinical correlation and follow-up recommended. Emphysema. Electronically Signed   By: Elgie Collard M.D.   On: 12/31/2015 20:11   Dg Chest Portable 1 View  Result Date: 12/31/2015 CLINICAL DATA:  Severe SOB today. Hx of asthma, COPD. Everyday smoker x1ppd x50 years. EXAM: PORTABLE CHEST 1 VIEW COMPARISON:  07/25/2015 FINDINGS: Atherosclerotic calcification of the aortic arch. Abnormal fullness of the right hilum. Suspected underlying emphysema. Interstitial accentuation in the lung bases. Mild biapical pleuroparenchymal scarring. Vague nodularity at the left lung base may relate to the adjacent rib the hand. Airway thickening is present, suggesting bronchitis or reactive airways disease. IMPRESSION: 1. Abnormal and increased prominence the right hilum. Hilar mass or adenopathy not excluded. CT chest recommended, with contrast if feasible. 2. Suspected emphysema. 3. Airway thickening is present, suggesting bronchitis or reactive  airways disease. Interstitial accentuation at the lung bases. Electronically Signed   By: Gaylyn Rong M.D.   On: 12/31/2015 18:40    Radiology No results found.  Procedures Procedures (including critical care time)  Medications Ordered in ED Medications  ipratropium-albuterol (DUONEB) 0.5-2.5 (3) MG/3ML nebulizer solution 3 mL (not administered)  Initial Impression / Assessment and Plan / ED Course  I have reviewed the triage vital signs and the nursing notes.  Pertinent labs & imaging results that were available during my care of the patient were reviewed by me and considered in my medical decision making (see chart for details).  Clinical Course    8 PM patient continues to be dyspneic and after nebulized treatment here, though she states she feels to be better. She appears to be uncomfortable, noted to be tachypneic and tachycardic. BiPAP ordered. 9 PM she is resting much more comfortably after treatment with BiPAP.  Dr.Opyd consulted and will see patient in hospital. Plan admit step down unit, BiPAP, oxygen therapy,  Final Clinical Impressions(s) / ED Diagnoses  Diagnosis #1acute hypoxic respiratory failure #2 COPD exacerbation Final diagnoses:  None  CRITICAL CARE Performed by: Doug Sou Total critical care time: 45 minutes Critical care time was exclusive of separately billable procedures and treating other patients. Critical care was necessary to treat or prevent imminent or life-threatening deterioration. Critical care was time spent personally by me on the following activities: development of treatment plan with patient and/or surrogate as well as nursing, discussions with consultants, evaluation of patient's response to treatment, examination of patient, obtaining history from patient or surrogate, ordering and performing treatments and interventions, ordering and review of laboratory studies, ordering and review of radiographic studies, pulse oximetry and  re-evaluation of patient's condition.  New Prescriptions New Prescriptions   No medications on file     Doug Sou, MD 12/31/15 2107

## 2015-12-31 NOTE — ED Notes (Signed)
MD at bedside. 

## 2016-01-01 ENCOUNTER — Inpatient Hospital Stay (HOSPITAL_COMMUNITY): Payer: Medicare Other

## 2016-01-01 DIAGNOSIS — J189 Pneumonia, unspecified organism: Secondary | ICD-10-CM

## 2016-01-01 DIAGNOSIS — J9601 Acute respiratory failure with hypoxia: Secondary | ICD-10-CM

## 2016-01-01 LAB — CBC WITH DIFFERENTIAL/PLATELET
BASOS PCT: 0 %
Basophils Absolute: 0 10*3/uL (ref 0.0–0.1)
EOS ABS: 0 10*3/uL (ref 0.0–0.7)
EOS PCT: 0 %
HCT: 45.4 % (ref 36.0–46.0)
Hemoglobin: 14.5 g/dL (ref 12.0–15.0)
Lymphocytes Relative: 6 %
Lymphs Abs: 0.6 10*3/uL — ABNORMAL LOW (ref 0.7–4.0)
MCH: 33.2 pg (ref 26.0–34.0)
MCHC: 31.9 g/dL (ref 30.0–36.0)
MCV: 103.9 fL — AB (ref 78.0–100.0)
MONO ABS: 0.3 10*3/uL (ref 0.1–1.0)
MONOS PCT: 3 %
Neutro Abs: 9.4 10*3/uL — ABNORMAL HIGH (ref 1.7–7.7)
Neutrophils Relative %: 91 %
PLATELETS: 275 10*3/uL (ref 150–400)
RBC: 4.37 MIL/uL (ref 3.87–5.11)
RDW: 12.2 % (ref 11.5–15.5)
WBC: 10.3 10*3/uL (ref 4.0–10.5)

## 2016-01-01 LAB — BLOOD GAS, ARTERIAL
ACID-BASE EXCESS: 7.5 mmol/L — AB (ref 0.0–2.0)
Acid-Base Excess: 5.3 mmol/L — ABNORMAL HIGH (ref 0.0–2.0)
Acid-Base Excess: 5.1 mmol/L — ABNORMAL HIGH (ref 0.0–2.0)
Acid-Base Excess: 3 mmol/L — ABNORMAL HIGH (ref 0.0–2.0)
BICARBONATE: 24.9 mmol/L (ref 20.0–28.0)
BICARBONATE: 27.8 mmol/L (ref 20.0–28.0)
BICARBONATE: 29.9 mmol/L — AB (ref 20.0–28.0)
Bicarbonate: 27.6 mmol/L (ref 20.0–28.0)
Delivery systems: POSITIVE
Drawn by: 422461
FIO2: 45
FIO2: 50
FIO2: 40
FIO2: 45
LHR: 12 {breaths}/min
LHR: 26 {breaths}/min
MECHVT: 400 mL
MECHVT: 500 mL
MECHVT: 420 mL
O2 SAT: 95.7 %
O2 SAT: 94.7 %
O2 Saturation: 94.6 %
O2 Saturation: 96.3 %
PATIENT TEMPERATURE: 98.6
PCO2 ART: 92.8 mmHg — AB (ref 32.0–48.0)
PCO2 ART: 108 mmHg — AB (ref 32.0–48.0)
PCO2 ART: 58.2 mmHg — AB (ref 32.0–48.0)
PCO2 ART: 58.4 mmHg — AB (ref 32.0–48.0)
PEEP/CPAP: 5 cmH2O
PEEP/CPAP: 5 cmH2O
PEEP: 5 cmH2O
PH ART: 7.34 — AB (ref 7.350–7.450)
PH ART: 7.329 — AB (ref 7.350–7.450)
PO2 ART: 95.2 mmHg (ref 83.0–108.0)
PO2 ART: 94.3 mmHg (ref 83.0–108.0)
PO2 ART: 77 mmHg — AB (ref 83.0–108.0)
RATE: 18 resp/min
pH, Arterial: 7.204 — ABNORMAL LOW (ref 7.350–7.450)
pH, Arterial: 7.127 — CL (ref 7.350–7.450)
pO2, Arterial: 87.6 mmHg (ref 83.0–108.0)

## 2016-01-01 LAB — BASIC METABOLIC PANEL
Anion gap: 7 (ref 5–15)
BUN: 12 mg/dL (ref 6–20)
CALCIUM: 8.1 mg/dL — AB (ref 8.9–10.3)
CO2: 28 mmol/L (ref 22–32)
CREATININE: 0.41 mg/dL — AB (ref 0.44–1.00)
Chloride: 99 mmol/L — ABNORMAL LOW (ref 101–111)
GFR calc Af Amer: >60 mL/min (ref >60)
GFR calc non Af Amer: >60 mL/min (ref >60)
Glucose, Bld: 144 mg/dL — ABNORMAL HIGH (ref 65–99)
Potassium: 5.1 mmol/L (ref 3.5–5.1)
Sodium: 134 mmol/L — ABNORMAL LOW (ref 135–145)

## 2016-01-01 LAB — TRIGLYCERIDES: Triglycerides: 64 mg/dL (ref <150)

## 2016-01-01 LAB — MRSA PCR SCREENING: MRSA by PCR: NEGATIVE

## 2016-01-01 MED ORDER — FAMOTIDINE IN NACL 20-0.9 MG/50ML-% IV SOLN
20.0000 mg | Freq: Two times a day (BID) | INTRAVENOUS | Status: DC
  Administered 2016-01-01 – 2016-01-08 (×15): 20 mg via INTRAVENOUS
  Filled 2016-01-01 (×15): qty 50

## 2016-01-01 MED ORDER — VITAL HIGH PROTEIN PO LIQD
1000.0000 mL | ORAL | Status: AC
  Administered 2016-01-02: 08:00:00
  Administered 2016-01-02 – 2016-01-03 (×2): 1000 mL
  Filled 2016-01-01 (×2): qty 1000

## 2016-01-01 MED ORDER — ORAL CARE MOUTH RINSE
15.0000 mL | Freq: Four times a day (QID) | OROMUCOSAL | Status: DC
Start: 2016-01-01 — End: 2016-01-08
  Administered 2016-01-01 – 2016-01-08 (×28): 15 mL via OROMUCOSAL

## 2016-01-01 MED ORDER — FUROSEMIDE 10 MG/ML IJ SOLN
40.0000 mg | Freq: Two times a day (BID) | INTRAMUSCULAR | Status: DC
  Administered 2016-01-02 – 2016-01-03 (×5): 40 mg via INTRAVENOUS
  Filled 2016-01-01 (×5): qty 4

## 2016-01-01 MED ORDER — SODIUM CHLORIDE 0.9 % IV SOLN
INTRAVENOUS | Status: DC
  Administered 2016-01-01: 1000 mL via INTRAVENOUS

## 2016-01-01 MED ORDER — METHYLPREDNISOLONE SODIUM SUCC 125 MG IJ SOLR
80.0000 mg | Freq: Four times a day (QID) | INTRAMUSCULAR | Status: DC
  Administered 2016-01-01 – 2016-01-02 (×4): 80 mg via INTRAVENOUS
  Filled 2016-01-01 (×4): qty 2

## 2016-01-01 MED ORDER — DOCUSATE SODIUM 50 MG/5ML PO LIQD
100.0000 mg | Freq: Two times a day (BID) | ORAL | Status: DC | PRN
  Administered 2016-01-10 – 2016-01-11 (×3): 100 mg
  Filled 2016-01-01 (×4): qty 10

## 2016-01-01 MED ORDER — FENTANYL CITRATE (PF) 100 MCG/2ML IJ SOLN
100.0000 ug | INTRAMUSCULAR | Status: AC | PRN
  Administered 2016-01-03 (×3): 100 ug via INTRAVENOUS
  Filled 2016-01-01 (×6): qty 2

## 2016-01-01 MED ORDER — SODIUM CHLORIDE 0.9 % IV SOLN
INTRAVENOUS | Status: DC
  Administered 2016-01-01 – 2016-01-07 (×3): via INTRAVENOUS

## 2016-01-01 MED ORDER — ALBUTEROL (5 MG/ML) CONTINUOUS INHALATION SOLN
10.0000 mg/h | INHALATION_SOLUTION | RESPIRATORY_TRACT | Status: DC
  Administered 2016-01-02: 10 mg/h via RESPIRATORY_TRACT
  Filled 2016-01-01: qty 20

## 2016-01-01 MED ORDER — SODIUM CHLORIDE 0.9 % IV SOLN
10.0000 ug/h | INTRAVENOUS | Status: DC
  Administered 2016-01-01: 10 ug/h via INTRAVENOUS
  Administered 2016-01-01: 200 ug/h via INTRAVENOUS
  Filled 2016-01-01: qty 50

## 2016-01-01 MED ORDER — FENTANYL CITRATE (PF) 100 MCG/2ML IJ SOLN
100.0000 ug | INTRAMUSCULAR | Status: DC | PRN
  Administered 2016-01-02 – 2016-01-04 (×8): 100 ug via INTRAVENOUS
  Filled 2016-01-01 (×5): qty 2

## 2016-01-01 MED ORDER — IPRATROPIUM-ALBUTEROL 0.5-2.5 (3) MG/3ML IN SOLN
3.0000 mL | Freq: Four times a day (QID) | RESPIRATORY_TRACT | Status: DC
  Administered 2016-01-01 – 2016-01-15 (×56): 3 mL via RESPIRATORY_TRACT
  Filled 2016-01-01 (×55): qty 3

## 2016-01-01 MED ORDER — SODIUM CHLORIDE 0.9 % IV SOLN
1.0000 mg/h | INTRAVENOUS | Status: DC
  Administered 2016-01-01: 1 mg/h via INTRAVENOUS
  Filled 2016-01-01: qty 10

## 2016-01-01 MED ORDER — MIDAZOLAM HCL 2 MG/2ML IJ SOLN
2.0000 mg | INTRAMUSCULAR | Status: AC | PRN
  Administered 2016-01-02 – 2016-01-03 (×3): 2 mg via INTRAVENOUS
  Filled 2016-01-01 (×3): qty 2

## 2016-01-01 MED ORDER — CHLORHEXIDINE GLUCONATE 0.12 % MT SOLN
15.0000 mL | Freq: Two times a day (BID) | OROMUCOSAL | Status: DC
  Administered 2016-01-01 – 2016-01-08 (×14): 15 mL via OROMUCOSAL
  Filled 2016-01-01 (×4): qty 15

## 2016-01-01 MED ORDER — PROPOFOL 1000 MG/100ML IV EMUL
INTRAVENOUS | Status: AC
  Filled 2016-01-01: qty 100

## 2016-01-01 MED ORDER — MIDAZOLAM HCL 2 MG/2ML IJ SOLN
2.0000 mg | INTRAMUSCULAR | Status: DC | PRN
  Administered 2016-01-01 – 2016-01-08 (×13): 2 mg via INTRAVENOUS
  Filled 2016-01-01 (×13): qty 2

## 2016-01-01 MED ORDER — PROPOFOL 1000 MG/100ML IV EMUL
5.0000 ug/kg/min | INTRAVENOUS | Status: DC
  Administered 2016-01-01: 35.154 ug/kg/min via INTRAVENOUS
  Administered 2016-01-01: 20 ug/kg/min via INTRAVENOUS
  Administered 2016-01-01: 39.96 ug/kg/min via INTRAVENOUS
  Administered 2016-01-02: 80 ug/kg/min via INTRAVENOUS
  Administered 2016-01-02: 50 ug/kg/min via INTRAVENOUS
  Administered 2016-01-02: 80 ug/kg/min via INTRAVENOUS
  Administered 2016-01-02 (×2): 60 ug/kg/min via INTRAVENOUS
  Administered 2016-01-02: 70 ug/kg/min via INTRAVENOUS
  Administered 2016-01-03: 60.192 ug/kg/min via INTRAVENOUS
  Administered 2016-01-03: 75 ug/kg/min via INTRAVENOUS
  Administered 2016-01-03: 70 ug/kg/min via INTRAVENOUS
  Administered 2016-01-03 (×3): 60 ug/kg/min via INTRAVENOUS
  Administered 2016-01-03: 75 ug/kg/min via INTRAVENOUS
  Administered 2016-01-04: 50 ug/kg/min via INTRAVENOUS
  Administered 2016-01-04: 65 ug/kg/min via INTRAVENOUS
  Filled 2016-01-01 (×18): qty 100

## 2016-01-01 NOTE — Progress Notes (Signed)
ANTIBIOTIC CONSULT NOTE  Pharmacy Consult for Renal Adjustment of ABX if needed ABX:  Rocephin and Zithromax  Allergies  Allergen Reactions  . Levofloxacin Shortness Of Breath   Patient Measurements: Height: 5\' 3"  (160 cm) Weight: 145 lb 4.5 oz (65.9 kg) IBW/kg (Calculated) : 52.4  Vital Signs: Temp: 96.8 F (36 C) (09/08 0513) Temp Source: Axillary (09/08 0513) BP: 113/54 (09/08 1100) Pulse Rate: 106 (09/08 1100)  Labs:  Recent Labs  12/31/15 1711 01/01/16 0426 01/01/16 0529  WBC 18.3*  --  10.3  HGB 15.0  --  14.5  PLT 310  --  275  CREATININE 0.46 0.41*  --    No results for input(s): VANCOTROUGH, VANCOPEAK, VANCORANDOM, GENTTROUGH, GENTPEAK, GENTRANDOM, TOBRATROUGH, TOBRAPEAK, TOBRARND, AMIKACINPEAK, AMIKACINTROU, AMIKACIN in the last 72 hours.   Medical History: Past Medical History:  Diagnosis Date  . Asthma   . COPD (chronic obstructive pulmonary disease) (HCC)   . Osteoporosis    Assessment: 69 started on Rocephin and Zithromax.  No renal adjustment needed.  Estimated Creatinine Clearance: 58.9 mL/min (by C-G formula based on SCr of 0.8 mg/dL).  Plan: Continue current Rx F/U to switch Zithromax to PO  Wayland DenisHall, Acie Custis A, [facility] PartnershipRPH 01/01/2016

## 2016-01-01 NOTE — Progress Notes (Signed)
PT Cancellation Note  Patient Details Name: Leslie Porter MRN: [id number] DOB: 09/24/1945   Cancelled Treatment:    Reason Eval/Treat Not Completed: Patient not medically ready (Pt is now intubated, [facility]  PT will sign off at this time. )   Beth Seairra Otani, PT, DPT X: [phone]

## 2016-01-01 NOTE — Progress Notes (Signed)
OT Cancellation Note  Patient Details Name: Leslie Porter MRN: [id number] DOB: 12/21/1945   Cancelled Treatment:     Reason evaluation not completed: Pt medical status/level of consciousness. Per chart review pt continues to require BiPAP. Pulmonology note at 8:54 this am reports pt is unable to be awakened and has potential to require mechanical ventilation. Will hold OT evaluation until pt is medically stable and appropriate.    Ezra SitesLeslie Troxler, OTR/L  [phone] 01/01/2016, 9:07 AM

## 2016-01-01 NOTE — Consult Note (Signed)
Consult requested by: Dr. Ardyth HarpsHernandez Consult requested for respiratory failure/pneumonia:  HPI: This is a 70 year old who has a long known history of COPD and continued tobacco abuse. She has been sick for about a week. She's been to her primary care physician was given intramuscular Rocephin started on oral antibiotics but continued having difficulty. She eventually had worsening respiratory distress and EMS was called and she was brought to the emergency department. She was started on BiPAP. She had chest x-ray and chest CT and appears to have right middle lobe pneumonia. She has white blood count of 18,300 and her blood gas showed that her pH was 7.36 PCO2 of 54 PO2 of 55. This morning she will not wake up. She is still on BiPAP. She did receive some Ativan earlier.  Past Medical History:  Diagnosis Date  . Asthma   . COPD (chronic obstructive pulmonary disease) (HCC)   . Osteoporosis      Family History  Problem Relation Age of Onset  . Heart attack Father   . Heart disease Mother      Social History   Social History  . Marital status: Divorced    Spouse name: N/A  . Number of children: N/A  . Years of education: N/A   Occupational History  . merchandising     [employer]   Social History Main Topics  . Smoking status: Current Every Day Smoker    Packs/day: 0.50    Years: 50.00    Types: Cigarettes  . Smokeless tobacco: Never Used  . Alcohol use 0.0 oz/week     Comment: Occasional  . Drug use: No  . Sexual activity: Not Asked   Other Topics Concern  . None   Social History Narrative  . None     ROS: Not obtainable    Objective: Vital signs in last 24 hours: Temp:  [96.8 F (36 C)-99.6 F (37.6 C)] 96.8 F (36 C) (09/08 0513) Pulse Rate:  [112-137] 112 (09/08 0513) Resp:  [21-34] 26 (09/08 0513) BP: (119-174)/(63-121) 138/74 (09/07 2130) SpO2:  [85 %-98 %] 96 % (09/08 0719) FiO2 (%):  [45 %] 45 % (09/08 0719) Weight:  [65.8 kg (145 lb)-65.9  kg (145 lb 4.5 oz)] 65.9 kg (145 lb 4.5 oz) (09/07 2220) Weight change:     Intake/Output from previous day: No intake/output data recorded.  PHYSICAL EXAM She is on BiPAP. She still has significant tachypnea. Her pupils react to some throat obscured by the BiPAP. Her neck is supple. Her chest shows rhonchi and wheezing bilaterally. Her heart is regular. Abdomen soft with no masses. Extremities showed no edema. Central nervous system exam not able to be evaluated  Lab Results: Basic Metabolic Panel:  Recent Labs  16/01/9608/07/17 1711 01/01/16 0426  NA 134* 134*  K 4.0 5.1  CL 97* 99*  CO2 27 28  GLUCOSE 133* 144*  BUN 12 12  CREATININE 0.46 0.41*  CALCIUM 8.7* 8.1*   Liver Function Tests: No results for input(s): AST, ALT, ALKPHOS, BILITOT, PROT, ALBUMIN in the last 72 hours. No results for input(s): LIPASE, AMYLASE in the last 72 hours. No results for input(s): AMMONIA in the last 72 hours. CBC:  Recent Labs  12/31/15 1711 01/01/16 0529  WBC 18.3* 10.3  NEUTROABS 15.9* 9.4*  HGB 15.0 14.5  HCT 44.7 45.4  MCV 100.4* 103.9*  PLT 310 275   Cardiac Enzymes: No results for input(s): CKTOTAL, CKMB, CKMBINDEX, TROPONINI in the last 72 hours. BNP: No results for  input(s): PROBNP in the last 72 hours. D-Dimer: No results for input(s): DDIMER in the last 72 hours. CBG: No results for input(s): GLUCAP in the last 72 hours. Hemoglobin A1C: No results for input(s): HGBA1C in the last 72 hours. Fasting Lipid Panel: No results for input(s): CHOL, HDL, LDLCALC, TRIG, CHOLHDL, LDLDIRECT in the last 72 hours. Thyroid Function Tests: No results for input(s): TSH, T4TOTAL, FREET4, T3FREE, THYROIDAB in the last 72 hours. Anemia Panel: No results for input(s): VITAMINB12, FOLATE, FERRITIN, TIBC, IRON, RETICCTPCT in the last 72 hours. Coagulation: No results for input(s): LABPROT, INR in the last 72 hours. Urine Drug Screen: Drugs of Abuse  No results found for: LABOPIA,  COCAINSCRNUR, LABBENZ, AMPHETMU, THCU, LABBARB  Alcohol Level: No results for input(s): ETH in the last 72 hours. Urinalysis: No results for input(s): COLORURINE, LABSPEC, PHURINE, GLUCOSEU, HGBUR, BILIRUBINUR, KETONESUR, PROTEINUR, UROBILINOGEN, NITRITE, LEUKOCYTESUR in the last 72 hours.  Invalid input(s): APPERANCEUR Misc. Labs:   ABGS:  Recent Labs  12/31/15 1810  PHART 7.357  PO2ART 55.3*  HCO3 26.8     MICROBIOLOGY: No results found for this or any previous visit (from the past 240 hour(s)).  Studies/Results: Ct Angio Chest Pe W Or Wo Contrast  Result Date: 12/31/2015 CLINICAL DATA:  70 year old female with dyspnea and tachycardia. Mass seen on chest radiograph. EXAM: CT ANGIOGRAPHY CHEST WITH CONTRAST TECHNIQUE: Multidetector CT imaging of the chest was performed using the standard protocol during bolus administration of intravenous contrast. Multiplanar CT image reconstructions and MIPs were obtained to evaluate the vascular anatomy. CONTRAST:  100 cc Isovue 370 COMPARISON:  Chest radiograph dated 12/31/2015 FINDINGS: Evaluation of this exam is limited due to respiratory motion artifact. There is emphysematous changes of the lungs. There is near complete consolidative changes of the medial segment of the right middle lobe, likely atelectasis versus infiltrate. The remainder of the lungs are clear. There is no pleural effusion or pneumothorax. The central airways are patent. There is atherosclerotic calcification of the thoracic aorta. There is no aneurysmal dilatation or evidence of dissection. The origins of the great vessels of the aortic arch appear patent. Evaluation of the pulmonary arteries is limited due to respiratory motion artifact and suboptimal opacification of the peripheral branches. No definite central pulmonary artery embolus identified. There is no cardiomegaly or pericardial effusion. There is coronary vascular calcification. A 10 mm hypodense structure in the  right hilar region (series 6, image 71) may represent hilar tissue or a top-normal lymph node. Mildly enlarged lymph node noted anterior to the carina which demonstrates fatty hilum. The esophagus is grossly unremarkable. No thyroid nodule identified. There is no axillary adenopathy. The chest wall soft tissues appear unremarkable. The there is osteopenia with degenerative changes of the spine. Old-appearing T6 an T7 compression deformity with anterior wedging resulting in thoracic kyphosis. 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 No acute fracture identified. The visualized upper abdomen is unremarkable. Review of the MIP images confirms the above findings. IMPRESSION: No CT evidence of central pulmonary artery embolus. Segmental consolidative changes of the right middle lobe. No definite obstructing mass identified. Clinical correlation and follow-up recommended. Emphysema. Electronically Signed   By: Elgie Collard M.D.   On: 12/31/2015 20:11   Dg Chest Portable 1 View  Result Date: 12/31/2015 CLINICAL DATA:  Severe SOB today. Hx of asthma, COPD. Everyday smoker x1ppd x50 years. EXAM: PORTABLE CHEST 1 VIEW COMPARISON:  07/25/2015 FINDINGS: Atherosclerotic calcification of the aortic arch. Abnormal fullness of the right hilum. Suspected underlying emphysema. Interstitial accentuation  in the lung bases. Mild biapical pleuroparenchymal scarring. Vague nodularity at the left lung base may relate to the adjacent rib the hand. Airway thickening is present, suggesting bronchitis or reactive airways disease. IMPRESSION: 1. Abnormal and increased prominence the right hilum. Hilar mass or adenopathy not excluded. CT chest recommended, with contrast if feasible. 2. Suspected emphysema. 3. Airway thickening is present, suggesting bronchitis or reactive airways disease. Interstitial accentuation at the lung bases. Electronically Signed   By: Gaylyn Rong M.D.   On: 12/31/2015 18:40    Medications:  Prior to Admission:   Prescriptions Prior to Admission  Medication Sig Dispense Refill Last Dose  . albuterol (PROVENTIL HFA;VENTOLIN HFA) 108 (90 Base) MCG/ACT inhaler Inhale 1-2 puffs into the lungs every 6 (six) hours as needed for wheezing or shortness of breath.   12/31/2015 at Unknown time  . alendronate (FOSAMAX) 70 MG tablet Take 70 mg by mouth every Sunday.   11 12/27/2015 at Unknown time  . budesonide-formoterol (SYMBICORT) 160-4.5 MCG/ACT inhaler Inhale 2 puffs into the lungs 2 (two) times daily.   12/31/2015 at Unknown time  . Calcium Carbonate (CALCIUM 600 PO) Take 1 tablet by mouth daily.   12/30/2015 at Unknown time  . [EXPIRED] cefTRIAXone (ROCEPHIN) 10 g injection Inject into the muscle once.    12/30/2015 at Unknown time  . ipratropium-albuterol (DUONEB) 0.5-2.5 (3) MG/3ML SOLN Take 3 mLs by nebulization 2 (two) times daily.   12/31/2015 at Unknown time  . Multiple Vitamin (MULTIVITAMIN) capsule Take 1 capsule by mouth daily.   12/30/2015 at Unknown time  . predniSONE (DELTASONE) 20 MG tablet Take 1 tablet by mouth twice daily x4 days, then 1 tab once daily x4 days, then 1/2 tab daily until gone. (Patient taking differently: Take 40 mg by mouth daily. 5 day course starting on 12/30/2015) 20 tablet 0 12/31/2015 at Unknown time  . umeclidinium bromide (INCRUSE ELLIPTA) 62.5 MCG/INH AEPB Inhale 1 puff into the lungs daily. 1 each 11 12/31/2015 at Unknown time  . vitamin E 400 UNIT capsule Take 400 Units by mouth daily.   12/30/2015 at Unknown time  . EPINEPHrine (EPIPEN 2-PAK) 0.3 mg/0.3 mL IJ SOAJ injection Inject 0.3 mLs (0.3 mg total) into the muscle once. 1 Device 0 unknown   Scheduled: . azithromycin  500 mg Intravenous Q24H  . calcium carbonate  1,250 mg Oral Q breakfast  . cefTRIAXone (ROCEPHIN)  IV  1 g Intravenous Q24H  . enoxaparin (LOVENOX) injection  40 mg Subcutaneous Q24H  . ipratropium-albuterol  3 mL Nebulization Q6H  . methylPREDNISolone (SOLU-MEDROL) injection  60 mg Intravenous Q6H  .  mometasone-formoterol  2 puff Inhalation BID  . multivitamin with minerals  1 tablet Oral Daily  . nicotine  21 mg Transdermal Daily   Continuous:  ZOX:[id number], guaiFENesin, HYDROcodone-acetaminophen, ipratropium-albuterol, LORazepam, ondansetron  Assesment: She is admitted with COPD exacerbation/community-acquired pneumonia/acute hypoxic and hypercapnic respiratory failure. She's not responsive this morning so it may be a drug effect but it may be that her CO2 has gone up. Principal Problem:   COPD with acute exacerbation (HCC) Active Problems:   Cigarette smoker   Acute respiratory failure with hypoxia and hypercapnia (HCC)   CAP (community acquired pneumonia)   Respiratory failure (HCC)    Plan: Check blood gas and chest x-ray. Depending on results of that she may need intubation and mechanical ventilation. Discontinue Incuse while she is on DuoNeb. I will not be here after about noon today until Monday morning 01/04/2016  LOS: 1 day   Cynai Skeens L 01/01/2016, 8:50 AM

## 2016-01-01 NOTE — Progress Notes (Signed)
eLink Physician-Brief Progress Note Patient Name: Leslie Porter DOB: 09/28/1945 MRN: [id number]   Date of Service  01/01/2016  HPI/Events of Note  [facility] Intubated and ventilated. Already on sedation.  eICU Interventions  Will order: 1. Ventilator settings: 45%/PRVC 26/TV 420/P 5. 2. ABG at 9:30 PM.     Intervention Category Major Interventions: Respiratory failure - evaluation and management  Sommer,Steven Eugene 01/01/2016, 8:51 PM

## 2016-01-01 NOTE — Progress Notes (Signed)
Initial Nutrition Assessment  DOCUMENTATION CODES:  Not applicable  INTERVENTION:  If unable to extubate in <48 hrs and medically able, recommend TF.   Initiate TF via OGT with Vital AF 1.2 at goal rate of 40 ml/h (960 ml per day) and Prostat 30 ml BID to provide 1352 kcals, 102 gm protein, 779 ml free water daily.  *TF recs will likely need optimized once [facility].   NUTRITION DIAGNOSIS:  Inadequate oral intake related to inability to eat as evidenced by NPO status.  GOAL:  Patient will meet greater than or equal to 90% of their needs  MONITOR:  Diet advancement, Vent status, Labs, I & O's, TF tolerance  REASON FOR ASSESSMENT:  Consult COPD Protocol  ASSESSMENT:  70 y/o female PMHx COPD, tobacco abuse, osteoporosis. Presents to ED w/ worsening shortness of breath x 1 week. Admitted for eval and management of COPD exacerbation, acute resp failure and possible CAP (WBC:18).   Pt was restless anxious and restless last night. Decision was made just a few minutes ago to intubate. [facility].    Long term weight history appears stable.   Patient is currently intubated on ventilator support MV: 5.4 L/min. Patient was just newly intubated and still agitated and restless, MVe likely change once settles.  Temp (24hrs), Avg:97.8 F (36.6 C), Min:96.8 F (36 C), Max:99.6 F (37.6 C)  Propofol: None at this time, though [facility] need [facility]  NFPE: unable to assess as being readied for transfer  Medications: IV abx, famotidine, solumedrol IV, MVI w/ min   Labs: WBC: 10.3, K 5.1  Recent Labs Lab 12/31/15 1711 01/01/16 0426  NA 134* 134*  K 4.0 5.1  CL 97* 99*  CO2 27 28  BUN 12 12  CREATININE 0.46 0.41*  CALCIUM 8.7* 8.1*  GLUCOSE 133* 144*    Diet Order:  Diet NPO time specified  Skin: Ecchymotic Arm   Last BM:  Unknown  Height:  Ht Readings from Last 1 Encounters:   12/31/15 5\' 3"  (1.6 m)   Weight:  Wt Readings from Last 1 Encounters:  12/31/15 145 lb 4.5 oz (65.9 kg)   Wt Readings from Last 10 Encounters:  12/31/15 145 lb 4.5 oz (65.9 kg)  07/25/15 140 lb (63.5 kg)  06/24/15 140 lb 9.6 oz (63.8 kg)   More weight hx obtained via Care Everywhere and OSH documentation:  142 lbs (64.4 kg) on 07/28/14 142 lb 6.4 oz (64.6 kg) on 02/19/2015 137 lbs 8 oz (62.4 kg) on 10/08/2013  Ideal Body Weight:  52.27 kg  BMI:  Body mass index is 25.74 kg/m.  Estimated Nutritional Needs:  Kcal:  1337 kcals Protein:  90-103 g (1.4-1.6 g/kg bw) Fluid:  >2 liters (30 ml/kg)  EDUCATION NEEDS:  No education needs identified at this time  Christophe LouisNathan Ronetta Molla RD, LDN, CNSC Clinical Nutrition Pager: [phone] 01/01/2016 10:53 AM

## 2016-01-01 NOTE — Plan of Care (Signed)
Problem: ICU Phase Progression Outcomes Goal: O2 sats trending toward baseline Outcome: Progressing Pt is on bipap

## 2016-01-01 NOTE — Progress Notes (Signed)
PROGRESS NOTE    Leslie Porter  ZOX:[id number] DOB: [birth year] DOA: 12/31/2015 PCP: [name], MD     Brief Narrative:  70 y/o woman admitted from home on 9/7 with SOB. Has a h/o COPD and tobacco abuse. SOB has progressively worsened over the past 1 week. She was given rocephin by her PCP and a prednisone taper. She was started on BIpap in the ED. She has spent the night on NIPPV, however despite this shows signs of increasing resp distress this am with anxiety, sats in the mid 80s and tachycardia. ABG shows worsening respiratory acidosis. Decision has been made to intubate her. Dr. Juanetta Gosling not available this weekend. Have discussed case with CCM at [facility], Dr. Craige Cotta, who has agreed to accept patient in transfer for continued vent management.   Assessment & Plan:   Principal Problem:   COPD with acute exacerbation (HCC) Active Problems:   Cigarette smoker   Acute respiratory failure with hypoxia and hypercapnia (HCC)   CAP (community acquired pneumonia)   Respiratory failure (HCC)   Acute on Chronic Hypoxemic and Hypercarbic Respiratory Failure -Due to COPD with acute exacerbation and CAP. -Will initiate intubation this am. Given lack of pulmonology coverage at [facility] this weekend, will transfer to [facility] for continued vent management. Discussed with Dr. Craige Cotta. -Most recent ABG: 7.20/93/95/28; worsened from 7.36/54/55/26.  COPD with Acute Exacerbation -Acute worsening this am requiring intubation. -Airways seem very "tight" with little air movement. -Will increase steroid dose, continue nebs, ventilator support for now.  CAP -Continue rocephin/azithro. -Check blood/sputum cx. -Legionella/strp pneumo urine antigens.  Tobacco Abuse -Nicotine patch.   DVT prophylaxis: Lovenox Code Status: Full Code Family Communication: No family available, intubation performed without consent given emergency situation. Disposition Plan: Transfer to ICU at [facility].  Consultants:    CCM  Procedures:   Intubation 9/8  Antimicrobials:   Rocephin  Azithro    Subjective: In bed, visibly agitated and flushed, not able to answer questions appropriately.  Objective: Vitals:   01/01/16 0513 01/01/16 0719 01/01/16 0800 01/01/16 0900  BP:   (!) 115/55 (!) 120/57  Pulse: (!) 112  (!) 102 (!) 104  Resp: (!) 26  (!) 27 (!) 26  Temp: (!) 96.8 F (36 C)     TempSrc: Axillary     SpO2: 96% 96% 97% 98%  Weight:      Height:       No intake or output data in the 24 hours ending 01/01/16 1003 Filed Weights   12/31/15 2017 12/31/15 2220  Weight: 65.8 kg (145 lb) 65.9 kg (145 lb 4.5 oz)    Examination:  General exam: confused Respiratory system: Poor air movement with expiratory wheezes Cardiovascular system: tachycardic. No murmurs, rubs, gallops. Gastrointestinal system: Abdomen is nondistended, soft and nontender. No organomegaly or masses felt. Normal bowel sounds heard. Central nervous system: Confused, restless Extremities: No C/C/E, +pedal pulses Psychiatry: Unable to assess given current mental state.    Data Reviewed: I have personally reviewed following labs and imaging studies  CBC:  Recent Labs Lab 12/31/15 1711 01/01/16 0529  WBC 18.3* 10.3  NEUTROABS 15.9* 9.4*  HGB 15.0 14.5  HCT 44.7 45.4  MCV 100.4* 103.9*  PLT 310 275   Basic Metabolic Panel:  Recent Labs Lab 12/31/15 1711 01/01/16 0426  NA 134* 134*  K 4.0 5.1  CL 97* 99*  CO2 27 28  GLUCOSE 133* 144*  BUN 12 12  CREATININE 0.46 0.41*  CALCIUM 8.7* 8.1*   GFR: Estimated Creatinine  Clearance: 58.9 mL/min (by C-G formula based on SCr of 0.8 mg/dL). Liver Function Tests: No results for input(s): AST, ALT, ALKPHOS, BILITOT, PROT, ALBUMIN in the last 168 hours. No results for input(s): LIPASE, AMYLASE in the last 168 hours. No results for input(s): AMMONIA in the last 168 hours. Coagulation Profile: No results for input(s): INR, PROTIME in the last 168  hours. Cardiac Enzymes: No results for input(s): CKTOTAL, CKMB, CKMBINDEX, TROPONINI in the last 168 hours. BNP (last 3 results) No results for input(s): PROBNP in the last 8760 hours. HbA1C: No results for input(s): HGBA1C in the last 72 hours. CBG: No results for input(s): GLUCAP in the last 168 hours. Lipid Profile: No results for input(s): CHOL, HDL, LDLCALC, TRIG, CHOLHDL, LDLDIRECT in the last 72 hours. Thyroid Function Tests: No results for input(s): TSH, T4TOTAL, FREET4, T3FREE, THYROIDAB in the last 72 hours. Anemia Panel: No results for input(s): VITAMINB12, FOLATE, FERRITIN, TIBC, IRON, RETICCTPCT in the last 72 hours. Urine analysis: No results found for: COLORURINE, APPEARANCEUR, LABSPEC, PHURINE, GLUCOSEU, HGBUR, BILIRUBINUR, KETONESUR, PROTEINUR, UROBILINOGEN, NITRITE, LEUKOCYTESUR Sepsis Labs: @LABRCNTIP (procalcitonin:4,lacticidven:4)  )No results found for this or any previous visit (from the past 240 hour(s)).       Radiology Studies: Ct Angio Chest Pe W Or Wo Contrast  Result Date: 12/31/2015 CLINICAL DATA:  70 year old female with dyspnea and tachycardia. Mass seen on chest radiograph. EXAM: CT ANGIOGRAPHY CHEST WITH CONTRAST TECHNIQUE: Multidetector CT imaging of the chest was performed using the standard protocol during bolus administration of intravenous contrast. Multiplanar CT image reconstructions and MIPs were obtained to evaluate the vascular anatomy. CONTRAST:  100 cc Isovue 370 COMPARISON:  Chest radiograph dated 12/31/2015 FINDINGS: Evaluation of this exam is limited due to respiratory motion artifact. There is emphysematous changes of the lungs. There is near complete consolidative changes of the medial segment of the right middle lobe, likely atelectasis versus infiltrate. The remainder of the lungs are clear. There is no pleural effusion or pneumothorax. The central airways are patent. There is atherosclerotic calcification of the thoracic aorta. There is  no aneurysmal dilatation or evidence of dissection. The origins of the great vessels of the aortic arch appear patent. Evaluation of the pulmonary arteries is limited due to respiratory motion artifact and suboptimal opacification of the peripheral branches. No definite central pulmonary artery embolus identified. There is no cardiomegaly or pericardial effusion. There is coronary vascular calcification. A 10 mm hypodense structure in the right hilar region (series 6, image 71) may represent hilar tissue or a top-normal lymph node. Mildly enlarged lymph node noted anterior to the carina which demonstrates fatty hilum. The esophagus is grossly unremarkable. No thyroid nodule identified. There is no axillary adenopathy. The chest wall soft tissues appear unremarkable. The there is osteopenia with degenerative changes of the spine. Old-appearing T6 an T7 compression deformity with anterior wedging resulting in thoracic kyphosis. 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 No acute fracture identified. The visualized upper abdomen is unremarkable. Review of the MIP images confirms the above findings. IMPRESSION: No CT evidence of central pulmonary artery embolus. Segmental consolidative changes of the right middle lobe. No definite obstructing mass identified. Clinical correlation and follow-up recommended. Emphysema. Electronically Signed   By: Elgie CollardArash  Radparvar M.D.   On: 12/31/2015 20:11   Dg Chest Portable 1 View  Result Date: 12/31/2015 CLINICAL DATA:  Severe SOB today. Hx of asthma, COPD. Everyday smoker x1ppd x50 years. EXAM: PORTABLE CHEST 1 VIEW COMPARISON:  07/25/2015 FINDINGS: Atherosclerotic calcification of the aortic arch. Abnormal fullness  of the right hilum. Suspected underlying emphysema. Interstitial accentuation in the lung bases. Mild biapical pleuroparenchymal scarring. Vague nodularity at the left lung base may relate to the adjacent rib the hand. Airway thickening is present, suggesting bronchitis or reactive  airways disease. IMPRESSION: 1. Abnormal and increased prominence the right hilum. Hilar mass or adenopathy not excluded. CT chest recommended, with contrast if feasible. 2. Suspected emphysema. 3. Airway thickening is present, suggesting bronchitis or reactive airways disease. Interstitial accentuation at the lung bases. Electronically Signed   By: Gaylyn Rong M.D.   On: 12/31/2015 18:40        Scheduled Meds: . azithromycin  500 mg Intravenous Q24H  . calcium carbonate  1,250 mg Oral Q breakfast  . cefTRIAXone (ROCEPHIN)  IV  1 g Intravenous Q24H  . chlorhexidine  15 mL Mouth Rinse BID  . enoxaparin (LOVENOX) injection  40 mg Subcutaneous Q24H  . famotidine (PEPCID) IV  20 mg Intravenous Q12H  . ipratropium-albuterol  3 mL Nebulization Q6H  . mouth rinse  15 mL Mouth Rinse QID  . methylPREDNISolone (SOLU-MEDROL) injection  60 mg Intravenous Q6H  . mometasone-formoterol  2 puff Inhalation BID  . multivitamin with minerals  1 tablet Oral Daily  . nicotine  21 mg Transdermal Daily   Continuous Infusions:    LOS: 1 day    Critical care time spent: 45 minutes. Greater than 50% of this time was spent in direct contact with the patient coordinating care.     Chaya Jan, MD [facility] Pager [phone]  If 7PM-7AM, please contact night-coverage [email or web address] Password [email or web address] 01/01/2016, 10:03 AM

## 2016-01-01 NOTE — Care Management Important Message (Signed)
Important Message  Patient Details  Name: Grace Bushyatricia Nosbisch MRN: [id number] Date of Birth: 06/16/1945   Medicare Important Message Given:  Yes    Malcolm MetroChildress, Shaw Dobek Demske, RN 01/01/2016, 12:19 PM

## 2016-01-01 NOTE — Progress Notes (Signed)
Patient left via stretcher with care link. Tried to call report to [facility] but they were in report and stated they would call back. The Patients family was called prior to transfer. No questions or concerns from [facility] regarding transfer. Patients vital signs were stable and she remained intubated on 40%.      Leslie Reznik RN

## 2016-01-01 NOTE — Clinical Social Work Note (Signed)
CSW consulted for COPD gold protocol. Pt does not meet criteria due to less than 3 admissions in past 6 months. CSW will sign off, but can be reconsulted if needed.  Derenda FennelKara Dawnielle Christiana, LCSW [phone]

## 2016-01-01 NOTE — Progress Notes (Addendum)
Her PCO2 was very high with low pH and she started struggling to breathe despite BiPAP. She was intubated semi-electively. She was a difficult intubation. I had initially held succinylcholine because her potassium was mildly elevated at 5.1 but gave her succinylcholine eventually and was able to intubate her. Endotracheal tube confirms in the right position. Discussed with Dr. Ardyth HarpsHernandez. [facility]  critical care time 1 hour  abg not much improved but she is fighting the ventilator. Start propoful and adjust ventilator settings

## 2016-01-01 NOTE — Progress Notes (Signed)
Pt is very restless and anxious. Pt is tossing and turning and kicking around in the bed. She keeps pulling at Bipap mask. I have explained that she has to calm down if she wants to avoid being intubated and given her her ativan a little early. Will continue to monitor

## 2016-01-01 NOTE — H&P (Signed)
PULMONARY / CRITICAL CARE MEDICINE   Name: Leslie Porter MRN: [id number] DOB: [birth year]    ADMISSION DATE:  12/31/2015 CONSULTATION DATE:  01/01/16  REFERRING MD:  Dr. Ardyth Harps / [facility]  PCP: Dr. Antony Haste   CHIEF COMPLAINT:   SOB  HISTORY OF PRESENT ILLNESS:   70 y/o F, current smoker, with PMH osteoporosis, asthma and COPD who presented to [facility] on 9/7 with a 4-5 day history of shortness of breath.  Prior to presentation, she had seen her PCP on 9/6 for similar symptoms.  She was treated with antibiotics (IM rocephin) and prednisone for possible COPD exacerbation.  She was also started on home oxygen (3L).  Later in the afternoon on 9/6, she called EMS but refused transport.  She saw her PCP again on 9/7 and received a second dose of IM rocephin.  The patient called EMS on the evening of 9/7 with reports of worsening SOB.  She was found tripoding on arrival.  EMS treated the patient with IV steroids, nebulized bronchodilators and oxygen.   Initial ER labs - Na 134, K 4.0, Cl 97, CO2 27, Sr Cr 0.46, WBC 18.3, Hgb 15, and platelets 310.  Initial ABG 7.357 / 53 / 55 / 26. CXR was concerning for increased prominence in the right hilum.  CTA of the chest was evaluted which was negative for PE but demonstrated consolidation in the RML with no obstructing mass identified, underlying emphysema.  After ER therapy she remained dyspneic and tachycardic.  She was placed on BiPAP and admitted per [facility] for further evaluation.     Chart review shows the patient was restless / anxious.  She was treated with ativan for anxiety.  Am of 9/8, she was noted to be lethargic on BiPAP.  Decision was made to intubate the patient for respiratory failure.  She was subsequently transferred to [facility] 9/8 for further care.    Patient seen and examined. She is currently managed on fentanly and propofol drips. She opens eyes to voice. Peak airway pressures are 38-40. TV reduced to 6mg /kg with reduction in peak airway pressure.  Continuous nebulizer and lasix ordered.   PAST MEDICAL HISTORY :  She  has a past medical history of Asthma; COPD (chronic obstructive pulmonary disease) (HCC); and Osteoporosis.  PAST SURGICAL HISTORY: She  has a past surgical history that includes Vaginal hysterectomy.  Allergies  Allergen Reactions  . Levofloxacin Shortness Of Breath    No current facility-administered medications on file prior to encounter.    Current Outpatient Prescriptions on File Prior to Encounter  Medication Sig  . albuterol (PROVENTIL HFA;VENTOLIN HFA) 108 (90 Base) MCG/ACT inhaler Inhale 1-2 puffs into the lungs every 6 (six) hours as needed for wheezing or shortness of breath.  . budesonide-formoterol (SYMBICORT) 160-4.5 MCG/ACT inhaler Inhale 2 puffs into the lungs 2 (two) times daily.  . Calcium Carbonate (CALCIUM 600 PO) Take 1 tablet by mouth daily.  Marland Kitchen ipratropium-albuterol (DUONEB) 0.5-2.5 (3) MG/3ML SOLN Take 3 mLs by nebulization 2 (two) times daily.  . Multiple Vitamin (MULTIVITAMIN) capsule Take 1 capsule by mouth daily.  . predniSONE (DELTASONE) 20 MG tablet Take 1 tablet by mouth twice daily x4 days, then 1 tab once daily x4 days, then 1/2 tab daily until gone. (Patient taking differently: Take 40 mg by mouth daily. 5 day course starting on 12/30/2015)  . umeclidinium bromide (INCRUSE ELLIPTA) 62.5 MCG/INH AEPB Inhale 1 puff into the lungs daily.  . vitamin E 400 UNIT capsule Take 400 Units  by mouth daily.  Marland Kitchen EPINEPHrine (EPIPEN 2-PAK) 0.3 mg/0.3 mL IJ SOAJ injection Inject 0.3 mLs (0.3 mg total) into the muscle once.    FAMILY HISTORY:  Her indicated that the status of her mother is unknown. She indicated that the status of her father is unknown.    SOCIAL HISTORY: She  reports that she has been smoking Cigarettes.  She has a 25.00 pack-year smoking history. She has never used smokeless tobacco. She reports that she drinks alcohol. She reports that she does not use drugs.  REVIEW OF SYSTEMS:   Unable to complete with patient as she is intubated on mechanical ventilation  SUBJECTIVE:   VITAL SIGNS: BP (!) 120/57   Pulse (!) 104   Temp (!) 96.8 F (36 C) (Axillary)   Resp (!) 26   Ht 5\' 3"  (1.6 m)   Wt 145 lb 4.5 oz (65.9 kg)   SpO2 98%   BMI 25.74 kg/m   HEMODYNAMICS:    VENTILATOR SETTINGS: FiO2 (%):  [45 %] 45 %  INTAKE / OUTPUT: No intake/output data recorded.  Physical Exam: Temp:  [96.8 F (36 C)] 96.8 F (36 C) (09/08 0513) Pulse Rate:  [79-129] 110 (09/08 2033) Resp:  [15-34] 26 (09/08 2033) BP: (63-179)/(42-86) 97/51 (09/08 1800) SpO2:  [84 %-100 %] 97 % (09/08 2033) FiO2 (%):  [40 %-50 %] 45 % (09/08 2033)  General Well nourished, well developed, no apparent distress  HEENT No gross abnormalities. Oropharynx clear.   Pulmonary Decreased breath sounds throughout, very tight sounding, minimal wheezing  Cardiovascular Normal rate, regular rhythm. S1, s2. No m/r/g. Distal pulses palpable.  Abdomen Soft, non-tender, non-distended, positive bowel sounds, no palpable organomegaly or masses. Normoresonant to percussion.  Musculoskeletal Patient is sedated and does not participated with MSK examination  Lymphatics No cervical, supraclavicular or axillary adenopathy.   Neurologic Intubated and sedated. Opens eyes to voice, puils small but equally reactive. No focal deficits.   Skin/Integuement No rash, no cyanosis, no clubbing.     LABS:  BMET  Recent Labs Lab 12/31/15 1711 01/01/16 0426  NA 134* 134*  K 4.0 5.1  CL 97* 99*  CO2 27 28  BUN 12 12  CREATININE 0.46 0.41*  GLUCOSE 133* 144*    Electrolytes  Recent Labs Lab 12/31/15 1711 01/01/16 0426  CALCIUM 8.7* 8.1*    CBC  Recent Labs Lab 12/31/15 1711 01/01/16 0529  WBC 18.3* 10.3  HGB 15.0 14.5  HCT 44.7 45.4  PLT 310 275    Coag's No results for input(s): APTT, INR in the last 168 hours.  Sepsis Markers No results for input(s): LATICACIDVEN, PROCALCITON, O2SATVEN in  the last 168 hours.  ABG  Recent Labs Lab 12/31/15 1810 01/01/16 0930  PHART 7.357 7.204*  PCO2ART 53.8* 92.8*  PO2ART 55.3* 95.2    Liver Enzymes No results for input(s): AST, ALT, ALKPHOS, BILITOT, ALBUMIN in the last 168 hours.  Cardiac Enzymes No results for input(s): TROPONINI, PROBNP in the last 168 hours.  Glucose No results for input(s): GLUCAP in the last 168 hours.  Imaging Ct Angio Chest Pe W Or Wo Contrast  Result Date: 12/31/2015 CLINICAL DATA:  70 year old female with dyspnea and tachycardia. Mass seen on chest radiograph. EXAM: CT ANGIOGRAPHY CHEST WITH CONTRAST TECHNIQUE: Multidetector CT imaging of the chest was performed using the standard protocol during bolus administration of intravenous contrast. Multiplanar CT image reconstructions and MIPs were obtained to evaluate the vascular anatomy. CONTRAST:  100 cc Isovue 370 COMPARISON:  Chest radiograph dated 12/31/2015 FINDINGS: Evaluation of this exam is limited due to respiratory motion artifact. There is emphysematous changes of the lungs. There is near complete consolidative changes of the medial segment of the right middle lobe, likely atelectasis versus infiltrate. The remainder of the lungs are clear. There is no pleural effusion or pneumothorax. The central airways are patent. There is atherosclerotic calcification of the thoracic aorta. There is no aneurysmal dilatation or evidence of dissection. The origins of the great vessels of the aortic arch appear patent. Evaluation of the pulmonary arteries is limited due to respiratory motion artifact and suboptimal opacification of the peripheral branches. No definite central pulmonary artery embolus identified. There is no cardiomegaly or pericardial effusion. There is coronary vascular calcification. A 10 mm hypodense structure in the right hilar region (series 6, image 71) may represent hilar tissue or a top-normal lymph node. Mildly enlarged lymph node noted anterior to  the carina which demonstrates fatty hilum. The esophagus is grossly unremarkable. No thyroid nodule identified. There is no axillary adenopathy. The chest wall soft tissues appear unremarkable. The there is osteopenia with degenerative changes of the spine. Old-appearing T6 an T7 compression deformity with anterior wedging resulting in thoracic kyphosis. 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 No acute fracture identified. The visualized upper abdomen is unremarkable. Review of the MIP images confirms the above findings. IMPRESSION: No CT evidence of central pulmonary artery embolus. Segmental consolidative changes of the right middle lobe. No definite obstructing mass identified. Clinical correlation and follow-up recommended. Emphysema. Electronically Signed   By: Elgie Collard M.D.   On: 12/31/2015 20:11   Dg Chest Portable 1 View  Result Date: 12/31/2015 CLINICAL DATA:  Severe SOB today. Hx of asthma, COPD. Everyday smoker x1ppd x50 years. EXAM: PORTABLE CHEST 1 VIEW COMPARISON:  07/25/2015 FINDINGS: Atherosclerotic calcification of the aortic arch. Abnormal fullness of the right hilum. Suspected underlying emphysema. Interstitial accentuation in the lung bases. Mild biapical pleuroparenchymal scarring. Vague nodularity at the left lung base may relate to the adjacent rib the hand. Airway thickening is present, suggesting bronchitis or reactive airways disease. IMPRESSION: 1. Abnormal and increased prominence the right hilum. Hilar mass or adenopathy not excluded. CT chest recommended, with contrast if feasible. 2. Suspected emphysema. 3. Airway thickening is present, suggesting bronchitis or reactive airways disease. Interstitial accentuation at the lung bases. Electronically Signed   By: Gaylyn Rong M.D.   On: 12/31/2015 18:40   STUDIES:  CTA Chest 9/7 >> neg for PE, segmental consolidative changes in the RML, no definite obstructing mass, emphysema  CULTURES: Sputum 9/7 >>  Strep Antigen 9/7 >>  U.  Legionella 9/7 >>  BCx2 9/7 >>   ANTIBIOTICS: Azithro 9/7 >>  Rocephin 9/7 >>   SIGNIFICANT EVENTS: 9/07  Admit with respiratory distress, RML consolidation   LINES/TUBES: ETT 9/7 >>   DISCUSSION: 70 y/o F, smoker, with PMH of COPD admitted to [facility] on 9/7 with complaints of SOB.  CTA neg for PE, found to have RML consolidation +/- COPD exacerbation.   ASSESSMENT / PLAN:  PULMONARY A: Acute Hypercarbic Respiratory Failure - in setting of RML consolidation  RML CAP  CT negative for PE COPD with suspected exacerbation - spirometry 06/24/15 with severe airway obstruction & low vital capacity  Tobacco Abuse  P:   PRVC 6 cc/kg Wean PEEP / FiO2 for sats 88-92% Solumedrol 80 mg IV Q6, consider reduction am 9/9 Change dulera to nebulized pulmicort / brovana  Add continuous nebulizer tonight for one hour  Chest PT with bed percussion Nicotine Patch See ID  CARDIOVASCULAR A: Hemodynamically stable, RRR P: Will add ECHO to look for occult heart failure in the setting of COPD exacerbation Lasix tonight  RENAL A:  No evidence of acute kidney injury, urine output is decreased at this time P:   Will add diuresis to optimize pulmonary status and increase urine output Monitor BMP daily to assess renal status  GASTROINTESTINAL A:  OG tube present P:   Initiate tube feeds with osmolite @ 20cc/hr, evaluate for residuals in am  HEMATOLOGIC A:  Hemoglobin stable.  P:  Transfuse to maintain Hgb > 7  INFECTIOUS A:  RML CAP  P:   ABX and cultures as above  Trend fever curve / WBC   ENDOCRINE A: Patient does not demonstrate endocrinopathy  P:   Daily BMP for glucose monitoring  NEUROLOGIC A:  Intubated, sedated, opens eyes to voice Acute Metabolic Encephalopathy - in the setting of hypercarbia   RASS goal: 0 On propofol gtt, added triglycerides as baseline measure Discontinue fentanyl gtt at this time  FAMILY  - Updates:   - Inter-disciplinary family meet or Palliative  Care meeting due by: day 7  Greta DoomAlexis C China Deitrick DO Pulmonary and Critical Care 01/01/16 11:33 PM

## 2016-01-02 ENCOUNTER — Inpatient Hospital Stay (HOSPITAL_COMMUNITY): Payer: Medicare Other

## 2016-01-02 DIAGNOSIS — J441 Chronic obstructive pulmonary disease with (acute) exacerbation: Secondary | ICD-10-CM

## 2016-01-02 DIAGNOSIS — J449 Chronic obstructive pulmonary disease, unspecified: Secondary | ICD-10-CM

## 2016-01-02 DIAGNOSIS — Z720 Tobacco use: Secondary | ICD-10-CM

## 2016-01-02 DIAGNOSIS — J9602 Acute respiratory failure with hypercapnia: Secondary | ICD-10-CM

## 2016-01-02 DIAGNOSIS — J189 Pneumonia, unspecified organism: Secondary | ICD-10-CM

## 2016-01-02 DIAGNOSIS — J9601 Acute respiratory failure with hypoxia: Secondary | ICD-10-CM

## 2016-01-02 LAB — ECHOCARDIOGRAM COMPLETE
HEIGHTINCHES: 63 in
WEIGHTICAEL: 2366.86 [oz_av]

## 2016-01-02 LAB — BLOOD GAS, ARTERIAL
ACID-BASE EXCESS: 2 mmol/L (ref 0.0–2.0)
BICARBONATE: 29.9 mmol/L — AB (ref 20.0–28.0)
DRAWN BY: 422461
FIO2: 40
LHR: 26 {breaths}/min
MECHVT: 320 mL
O2 Saturation: 96.2 %
PEEP/CPAP: 5 cmH2O
PO2 ART: 89.6 mmHg (ref 83.0–108.0)
Patient temperature: 98.2
pCO2 arterial: 64 mmHg — ABNORMAL HIGH (ref 32.0–48.0)
pH, Arterial: 7.289 — ABNORMAL LOW (ref 7.350–7.450)

## 2016-01-02 LAB — GLUCOSE, CAPILLARY
GLUCOSE-CAPILLARY: 121 mg/dL — AB (ref 65–99)
GLUCOSE-CAPILLARY: 143 mg/dL — AB (ref 65–99)
GLUCOSE-CAPILLARY: 195 mg/dL — AB (ref 65–99)
GLUCOSE-CAPILLARY: 195 mg/dL — AB (ref 65–99)
Glucose-Capillary: 174 mg/dL — ABNORMAL HIGH (ref 65–99)
Glucose-Capillary: 172 mg/dL — ABNORMAL HIGH (ref 65–99)

## 2016-01-02 LAB — HIV ANTIBODY (ROUTINE TESTING W REFLEX): HIV SCREEN 4TH GENERATION: NONREACTIVE

## 2016-01-02 LAB — STREP PNEUMONIAE URINARY ANTIGEN: Strep Pneumo Urinary Antigen: NEGATIVE

## 2016-01-02 MED ORDER — PERFLUTREN LIPID MICROSPHERE
1.0000 mL | INTRAVENOUS | Status: AC | PRN
  Administered 2016-01-02: 2 mL via INTRAVENOUS
  Filled 2016-01-02: qty 10

## 2016-01-02 MED ORDER — METHYLPREDNISOLONE SODIUM SUCC 125 MG IJ SOLR
80.0000 mg | INTRAMUSCULAR | Status: DC
  Administered 2016-01-03: 80 mg via INTRAVENOUS
  Filled 2016-01-02: qty 2

## 2016-01-02 NOTE — Progress Notes (Signed)
160 ml of fentanyl drip wasted in the sink witnessed by Volanda NapoleonStephanie Ladarrion Telfair, RN

## 2016-01-02 NOTE — Progress Notes (Signed)
PULMONARY / CRITICAL CARE MEDICINE   Name: Leslie Porter MRN: [id number] DOB: 09/23/1945    ADMISSION DATE:  12/31/2015 CONSULTATION DATE:  01/01/16  REFERRING MD:  Dr. Ardyth HarpsHernandez / [facility]  PCP: Dr. Antony HasteMichael Badger   CHIEF COMPLAINT:   SOB  BRIEF: 70 y/o female with advanced COPD still smoking who was admitted for acute respiratory failure with hypoxemia due to severe CAP and a COPD exacerbation. Required intubation 9/9.   SUBJECTIVE:  Remains sedated on vent  VITAL SIGNS: BP (!) 118/59   Pulse (!) 121   Temp 98.4 F (36.9 C) (Oral)   Resp (!) 29   Ht 5\' 3"  (1.6 m)   Wt 147 lb 14.9 oz (67.1 kg)   SpO2 93%   BMI 26.20 kg/m   HEMODYNAMICS:    VENTILATOR SETTINGS: Vent Mode: PRVC FiO2 (%):  [40 %-45 %] 40 % Set Rate:  [18 bmp-30 bmp] 30 bmp Vt Set:  [320 mL-500 mL] 320 mL PEEP:  [5 cmH20] 5 cmH20 Plateau Pressure:  [18 cmH20-25 cmH20] 24 cmH20  INTAKE / OUTPUT: I/O last 3 completed shifts: In: 810 [I.V.:290; NG/GT:120; IV Piggyback:400] Out: 1200 [Urine:1200]  Physical Exam: Temp:  [98.2 F (36.8 C)-99.3 F (37.4 C)] 98.4 F (36.9 C) (09/09 0800) Pulse Rate:  [79-130] 121 (09/09 0815) Resp:  [15-31] 29 (09/09 0815) BP: (63-157)/(42-118) 118/59 (09/09 0800) SpO2:  [90 %-100 %] 93 % (09/09 0815) FiO2 (%):  [40 %-45 %] 40 % (09/09 0803) Weight:  [147 lb 14.9 oz (67.1 kg)] 147 lb 14.9 oz (67.1 kg) (09/09 0500)  Gen: sedated on vent HENT: NCAT ETT in place PULM: Poor air movement, wheeze on left, vent supported breaths CV: RRR, no mgr GI: BS+, soft, nontender MSK: normal bulk and tone Neuro: sedated on vent  LABS:  BMET  Recent Labs Lab 12/31/15 1711 01/01/16 0426  NA 134* 134*  K 4.0 5.1  CL 97* 99*  CO2 27 28  BUN 12 12  CREATININE 0.46 0.41*  GLUCOSE 133* 144*    Electrolytes  Recent Labs Lab 12/31/15 1711 01/01/16 0426  CALCIUM 8.7* 8.1*    CBC  Recent Labs Lab 12/31/15 1711 01/01/16 0529  WBC 18.3* 10.3  HGB 15.0 14.5  HCT  44.7 45.4  PLT 310 275    Coag's No results for input(s): APTT, INR in the last 168 hours.  Sepsis Markers No results for input(s): LATICACIDVEN, PROCALCITON, O2SATVEN in the last 168 hours.  ABG  Recent Labs Lab 01/01/16 1556 01/01/16 2139 01/02/16 0013  PHART 7.340* 7.329* 7.289*  PCO2ART 58.2* 58.4* 64.0*  PO2ART 87.6 77.0* 89.6    Liver Enzymes No results for input(s): AST, ALT, ALKPHOS, BILITOT, ALBUMIN in the last 168 hours.  Cardiac Enzymes No results for input(s): TROPONINI, PROBNP in the last 168 hours.  Glucose  Recent Labs Lab 01/02/16 0046 01/02/16 0403 01/02/16 0814  GLUCAP 195* 174* 172*    Imaging No results found. STUDIES:  CTA Chest 9/7 >> neg for PE, segmental consolidative changes in the RML, no definite obstructing mass, emphysema  CULTURES: Sputum 9/7 >>  Strep Antigen 9/7 >>  U. Legionella 9/7 >>  BCx2 9/7 >>   ANTIBIOTICS: Azithro 9/7 >>  Rocephin 9/7 >>   SIGNIFICANT EVENTS: 9/07  Admit with respiratory distress, RML consolidation   LINES/TUBES: ETT 9/7 >>   DISCUSSION: 70 y/o F, smoker, with PMH of COPD [facility] with complaints of SOB.  CTA neg for PE, found to  have RML consolidation causing severe CAP, complicated by possible COPD exacerbation.   ASSESSMENT / PLAN:  PULMONARY A: Acute Hypercarbic Respiratory Failure - in setting of CAP RML CAP  CT negative for PE COPD with exacerbation - spirometry 06/24/15 with severe airway obstruction & low vital capacity  Tobacco Abuse  P:   PRVC 6 cc/kg Wean PEEP / FiO2 for sats 88-92% Solumedrol 80 mg IV daily Nebulized pulmicort / brovana  Chest PT with bed percussion Nicotine Patch See ID  CARDIOVASCULAR A: No acute issues P: Will f/u ECHO to look for occult heart failure in the setting of COPD exacerbation Hold lasix  RENAL A:  No acute issues P:   Monitor BMET and UOP Replace electrolytes as needed   GASTROINTESTINAL A:  OG tube present P:    Continue tube feedings Continue famotidine for stress ulcer prophylaxis  HEMATOLOGIC A:  Hemoglobin stable.  P:  Transfuse to maintain Hgb > 7  INFECTIOUS A:  RML CAP  P:   ABX and cultures as above  Trend fever curve / WBC   ENDOCRINE A: No acute issues  P:   Daily BMP for glucose monitoring  NEUROLOGIC A:  No acute issues Acute Metabolic Encephalopathy - in the setting of hypercarbia   RASS goal: -2 On propofol gtt, added triglycerides as baseline measure Fentanyl prn  FAMILY  - Updates: family updated bedside at length by me on 9/9  - Inter-disciplinary family meet or Palliative Care meeting due by: day 7  Cc time 40 minutes  Heber Aguas Buenas, MD [place] [facility] Pager: [phone] Cell: [phone] After 3pm or if no response, call [phone]  01/02/16 11:16 AM

## 2016-01-02 NOTE — Progress Notes (Signed)
Echocardiogram 2D Echocardiogram has been performed with definity.  Leslie Porter 01/02/2016, 11:28 AM

## 2016-01-02 NOTE — Progress Notes (Signed)
OT Cancellation Note and Discharge  Patient Details Name: Leslie Porter MRN: [id number] DOB: 11/06/1945   Cancelled Treatment:    Reason Eval/Treat Not Completed: Medical issues which prohibited therapy. Pt intubated and sedated. Will continue to follow for appropriateness at first of week.  Leslie Porter, Leslie Porter [phone] 01/02/2016, 7:32 AM

## 2016-01-03 ENCOUNTER — Inpatient Hospital Stay (HOSPITAL_COMMUNITY): Payer: Medicare Other

## 2016-01-03 LAB — GLUCOSE, CAPILLARY
GLUCOSE-CAPILLARY: 123 mg/dL — AB (ref 65–99)
GLUCOSE-CAPILLARY: 124 mg/dL — AB (ref 65–99)
GLUCOSE-CAPILLARY: 130 mg/dL — AB (ref 65–99)
GLUCOSE-CAPILLARY: 146 mg/dL — AB (ref 65–99)
GLUCOSE-CAPILLARY: 188 mg/dL — AB (ref 65–99)
Glucose-Capillary: 158 mg/dL — ABNORMAL HIGH (ref 65–99)
Glucose-Capillary: 167 mg/dL — ABNORMAL HIGH (ref 65–99)

## 2016-01-03 LAB — CBC WITH DIFFERENTIAL/PLATELET
Basophils Absolute: 0 10*3/uL (ref 0.0–0.1)
Basophils Relative: 0 %
Eosinophils Absolute: 0 10*3/uL (ref 0.0–0.7)
Eosinophils Relative: 0 %
HEMATOCRIT: 42.7 % (ref 36.0–46.0)
Hemoglobin: 13.8 g/dL (ref 12.0–15.0)
LYMPHS PCT: 9 %
Lymphs Abs: 1.3 10*3/uL (ref 0.7–4.0)
MCH: 33.7 pg (ref 26.0–34.0)
MCHC: 32.3 g/dL (ref 30.0–36.0)
MCV: 104.4 fL — AB (ref 78.0–100.0)
MONO ABS: 1.7 10*3/uL — AB (ref 0.1–1.0)
MONOS PCT: 12 %
NEUTROS ABS: 11 10*3/uL — AB (ref 1.7–7.7)
Neutrophils Relative %: 79 %
Platelets: 218 10*3/uL (ref 150–400)
RBC: 4.09 MIL/uL (ref 3.87–5.11)
RDW: 13.3 % (ref 11.5–15.5)
WBC: 14 10*3/uL — ABNORMAL HIGH (ref 4.0–10.5)

## 2016-01-03 LAB — BASIC METABOLIC PANEL
ANION GAP: 7 (ref 5–15)
BUN: 27 mg/dL — ABNORMAL HIGH (ref 6–20)
CALCIUM: 7.8 mg/dL — AB (ref 8.9–10.3)
CO2: 35 mmol/L — AB (ref 22–32)
Chloride: 98 mmol/L — ABNORMAL LOW (ref 101–111)
Creatinine, Ser: 0.68 mg/dL (ref 0.44–1.00)
GFR calc Af Amer: >60 mL/min (ref >60)
GFR calc non Af Amer: >60 mL/min (ref >60)
GLUCOSE: 127 mg/dL — AB (ref 65–99)
POTASSIUM: 4.4 mmol/L (ref 3.5–5.1)
Sodium: 140 mmol/L (ref 135–145)

## 2016-01-03 MED ORDER — PRO-STAT SUGAR FREE PO LIQD
30.0000 mL | Freq: Every day | ORAL | Status: DC
  Administered 2016-01-04: 30 mL
  Filled 2016-01-03: qty 30

## 2016-01-03 MED ORDER — ALBUTEROL SULFATE (2.5 MG/3ML) 0.083% IN NEBU: 2.5000 mg | INHALATION_SOLUTION | RESPIRATORY_TRACT | Status: DC | PRN

## 2016-01-03 MED ORDER — SODIUM CHLORIDE 0.9 % IV BOLUS (SEPSIS)
1000.0000 mL | Freq: Once | INTRAVENOUS | Status: AC
  Administered 2016-01-03: 1000 mL via INTRAVENOUS

## 2016-01-03 MED ORDER — PRO-STAT SUGAR FREE PO LIQD
60.0000 mL | Freq: Two times a day (BID) | ORAL | Status: DC
  Administered 2016-01-03 (×2): 60 mL
  Filled 2016-01-03 (×2): qty 60

## 2016-01-03 MED ORDER — METHYLPREDNISOLONE SODIUM SUCC 125 MG IJ SOLR
80.0000 mg | Freq: Two times a day (BID) | INTRAMUSCULAR | Status: DC
  Administered 2016-01-03 – 2016-01-05 (×5): 80 mg via INTRAVENOUS
  Filled 2016-01-03 (×5): qty 2

## 2016-01-03 MED ORDER — ADULT MULTIVITAMIN LIQUID CH
15.0000 mL | Freq: Every day | ORAL | Status: DC
  Administered 2016-01-03 – 2016-01-08 (×6): 15 mL
  Filled 2016-01-03 (×6): qty 15

## 2016-01-03 MED ORDER — INSULIN ASPART 100 UNIT/ML ~~LOC~~ SOLN
0.0000 [IU] | SUBCUTANEOUS | Status: DC
  Administered 2016-01-03 – 2016-01-04 (×3): 2 [IU] via SUBCUTANEOUS
  Administered 2016-01-04 (×2): 1 [IU] via SUBCUTANEOUS
  Administered 2016-01-04 (×2): 2 [IU] via SUBCUTANEOUS
  Administered 2016-01-05: 1 [IU] via SUBCUTANEOUS
  Administered 2016-01-05: 3 [IU] via SUBCUTANEOUS
  Administered 2016-01-05 (×2): 2 [IU] via SUBCUTANEOUS
  Administered 2016-01-05: 1 [IU] via SUBCUTANEOUS
  Administered 2016-01-05: 2 [IU] via SUBCUTANEOUS
  Administered 2016-01-06: 3 [IU] via SUBCUTANEOUS
  Administered 2016-01-06 (×2): 1 [IU] via SUBCUTANEOUS
  Administered 2016-01-06: 2 [IU] via SUBCUTANEOUS
  Administered 2016-01-06: 3 [IU] via SUBCUTANEOUS
  Administered 2016-01-07: 1 [IU] via SUBCUTANEOUS
  Administered 2016-01-07: 2 [IU] via SUBCUTANEOUS
  Administered 2016-01-07: 1 [IU] via SUBCUTANEOUS
  Administered 2016-01-07: 2 [IU] via SUBCUTANEOUS
  Administered 2016-01-07 – 2016-01-08 (×3): 1 [IU] via SUBCUTANEOUS
  Administered 2016-01-08: 2 [IU] via SUBCUTANEOUS
  Administered 2016-01-08 – 2016-01-09 (×2): 1 [IU] via SUBCUTANEOUS
  Administered 2016-01-09: 2 [IU] via SUBCUTANEOUS
  Administered 2016-01-10: 3 [IU] via SUBCUTANEOUS
  Administered 2016-01-10: 2 [IU] via SUBCUTANEOUS
  Administered 2016-01-10 (×2): 4 [IU] via SUBCUTANEOUS
  Administered 2016-01-10 – 2016-01-11 (×3): 2 [IU] via SUBCUTANEOUS
  Administered 2016-01-11 – 2016-01-12 (×5): 1 [IU] via SUBCUTANEOUS
  Administered 2016-01-12: 2 [IU] via SUBCUTANEOUS
  Administered 2016-01-13 – 2016-01-14 (×2): 1 [IU] via SUBCUTANEOUS
  Administered 2016-01-14: 2 [IU] via SUBCUTANEOUS
  Administered 2016-01-15: 1 [IU] via SUBCUTANEOUS

## 2016-01-03 MED ORDER — VITAL AF 1.2 CAL PO LIQD
1000.0000 mL | ORAL | Status: DC
  Administered 2016-01-03: 1000 mL
  Filled 2016-01-03 (×2): qty 1000

## 2016-01-03 MED ORDER — CALCIUM CARBONATE 1250 MG/5ML PO SUSP
500.0000 mg | Freq: Every day | ORAL | Status: DC
  Administered 2016-01-03 – 2016-01-14 (×10): 500 mg
  Filled 2016-01-03 (×13): qty 5

## 2016-01-03 NOTE — Progress Notes (Signed)
Nutrition Follow-up  INTERVENTION:   -D/c Vital HP -Initiate Vital AF 1.2 via OGT at 15 ml/h and Prostat 60 ml BID + 30 ml once daily  -This tube feeding regimen + current Propofol infusion to provide 1560 kcals, 102 gm protein, 292 ml free water daily.  RD to continue to monitor  NUTRITION DIAGNOSIS:   Inadequate oral intake related to inability to eat as evidenced by NPO status.  Ongoing.  GOAL:   Patient will meet greater than or equal to 90% of their needs  Progressing.  MONITOR:   Diet advancement, Vent status, Labs, I & O's, TF tolerance  REASON FOR ASSESSMENT:   Consult Enteral/tube feeding initiation and management  ASSESSMENT:   70 y/o female with advanced COPD still smoking who was admitted for acute respiratory failure with hypoxemia due to severe CAP and a COPD exacerbation. Required intubation 9/9.  Patient in room with family and RN at bedside. Per RN, pt is tolerating Vital HP @ 20 ml/hr with no issues. RN to complete infusion of Vital HP bottle then transition to Vital AF 1.2 @ 15 ml/hr. Will advance once Propofol infusion decreased.   Patient is currently intubated on ventilator support MV: 9.3 L/min Temp (24hrs), Avg:98.8 F (37.1 C), Min:98.4 F (36.9 C), Max:99.3 F (37.4 C)  Propofol: 23.8 ml/hr -provides 628 kcal  Medications: IV Lasix BID, Liquid multivitamin daily Labs reviewed: CBGs: 124-130  Diet Order:  Diet NPO time specified  Skin:  Reviewed, no issues  Last BM:  Unknown  Height:   Ht Readings from Last 1 Encounters:  01/01/16 5\' 3"  (1.6 m)    Weight:   Wt Readings from Last 1 Encounters:  01/03/16 146 lb 2.6 oz (66.3 kg)    Ideal Body Weight:  52.27 kg  BMI:  Body mass index is 25.89 kg/m.  Estimated Nutritional Needs:   Kcal:  1461  Protein:  95-105g  Fluid:  1.6L/day  EDUCATION NEEDS:   No education needs identified at this time  Tilda FrancoLindsey Owais Pruett, MS, RD, LDN Pager: [phone] After Hours Pager:  [phone]

## 2016-01-03 NOTE — Progress Notes (Signed)
PULMONARY / CRITICAL CARE MEDICINE   Name: Leslie Porter MRN: [id number] DOB: 01-Oct-1945    ADMISSION DATE:  12/31/2015 CONSULTATION DATE:  01/01/16  REFERRING MD:  Dr. Ardyth Harps / [facility]  PCP: Dr. Antony Haste   CHIEF COMPLAINT:   SOB  BRIEF: 70 y/o female with advanced COPD still smoking who was admitted for acute respiratory failure with hypoxemia due to severe CAP and a COPD exacerbation. Required intubation 9/9.   SUBJECTIVE:  Remains sedated on vent Wheezing today  VITAL SIGNS: BP (!) 161/57   Pulse (!) 106   Temp 99.3 F (37.4 C) (Axillary)   Resp (!) 30   Ht 5\' 3"  (1.6 m)   Wt 146 lb 2.6 oz (66.3 kg)   SpO2 95%   BMI 25.89 kg/m   HEMODYNAMICS:    VENTILATOR SETTINGS: Vent Mode: PRVC FiO2 (%):  [40 %] 40 % Set Rate:  [30 bmp] 30 bmp Vt Set:  [320 mL] 320 mL PEEP:  [5 cmH20] 5 cmH20 Plateau Pressure:  [12 cmH20-20 cmH20] 20 cmH20  INTAKE / OUTPUT: I/O last 3 completed shifts: In: 2095.8 [I.V.:965.8; NG/GT:380; IV Piggyback:750] Out: 4000 [Urine:4000]  Physical Exam: Temp:  [98.4 F (36.9 C)-99.3 F (37.4 C)] 99.3 F (37.4 C) (09/10 0400) Pulse Rate:  [87-120] 106 (09/10 1200) Resp:  [25-30] 30 (09/10 1200) BP: (90-167)/(44-83) 161/57 (09/10 1200) SpO2:  [90 %-99 %] 95 % (09/10 1200) FiO2 (%):  [40 %] 40 % (09/10 0855) Weight:  [146 lb 2.6 oz (66.3 kg)] 146 lb 2.6 oz (66.3 kg) (09/10 0348)  Gen: sedated on vent HENT: NCAT ETT in place PULM: Very poor air movement, notable wheezing, vent supported breaths CV: RRR, no mgr GI: BS+, soft, nontender MSK: normal bulk and tone Neuro: sedated on vent  LABS:  BMET  Recent Labs Lab 12/31/15 1711 01/01/16 0426 01/03/16 0344  NA 134* 134* 140  K 4.0 5.1 4.4  CL 97* 99* 98*  CO2 27 28 35*  BUN 12 12 27*  CREATININE 0.46 0.41* 0.68  GLUCOSE 133* 144* 127*    Electrolytes  Recent Labs Lab 12/31/15 1711 01/01/16 0426 01/03/16 0344  CALCIUM 8.7* 8.1* 7.8*    CBC  Recent Labs Lab  12/31/15 1711 01/01/16 0529 01/03/16 0344  WBC 18.3* 10.3 14.0*  HGB 15.0 14.5 13.8  HCT 44.7 45.4 42.7  PLT 310 275 218    Coag's No results for input(s): APTT, INR in the last 168 hours.  Sepsis Markers No results for input(s): LATICACIDVEN, PROCALCITON, O2SATVEN in the last 168 hours.  ABG  Recent Labs Lab 01/01/16 1556 01/01/16 2139 01/02/16 0013  PHART 7.340* 7.329* 7.289*  PCO2ART 58.2* 58.4* 64.0*  PO2ART 87.6 77.0* 89.6    Liver Enzymes No results for input(s): AST, ALT, ALKPHOS, BILITOT, ALBUMIN in the last 168 hours.  Cardiac Enzymes No results for input(s): TROPONINI, PROBNP in the last 168 hours.  Glucose  Recent Labs Lab 01/02/16 1702 01/02/16 2019 01/03/16 0012 01/03/16 0351 01/03/16 0806 01/03/16 1225  GLUCAP 143* 121* 123* 124* 130* 158*    Imaging Dg Chest Port 1 View  Result Date: 01/03/2016 CLINICAL DATA:  Acute respiratory failure with hypoxemia. EXAM: PORTABLE CHEST 1 VIEW COMPARISON:  Radiographs of January 01, 2016. FINDINGS: The heart size and mediastinal contours are within normal limits. Both lungs are clear. Endotracheal tube is in grossly good position. Atherosclerosis of thoracic aorta is noted. Distal tip of nasogastric tube is seen in proximal stomach. The visualized skeletal structures are  unremarkable. IMPRESSION: Aortic atherosclerosis. Distal tip of nasogastric tube seen in proximal stomach. Endotracheal tube in grossly good position. No acute cardiopulmonary abnormality seen. Electronically Signed   By: Lupita RaiderJames  Green Jr, M.D.   On: 01/03/2016 07:13   STUDIES:  CTA Chest 9/7 >> neg for PE, segmental consolidative changes in the RML, no definite obstructing mass, emphysema Echo 9/7 > LVEF 65-70%,  CULTURES: Sputum 9/7 >>  Strep Antigen 9/7 >>  U. Legionella 9/7 >>  BCx2 9/7 >>   ANTIBIOTICS: Azithro 9/7 >>  Rocephin 9/7 >>   SIGNIFICANT EVENTS: 9/07  Admit with respiratory distress, RML consolidation    LINES/TUBES: ETT 9/7 >>   DISCUSSION: 70 y/o F, smoker, with PMH of COPD [facility] of [facility].  CTA neg for PE, found to have RML consolidation causing severe CAP, complicated by possible COPD exacerbation.   ASSESSMENT / PLAN:  PULMONARY A: Acute Hypercarbic Respiratory Failure - in setting of CAP RML CAP  COPD with exacerbation - spirometry 06/24/15 with severe airway obstruction & low vital capacity  Tobacco Abuse  P:   Continue full vent support Wean PEEP / FiO2 for sats 88-92% Solumedrol 80 mg IV q12h (increase today) Nebulized pulmicort / brovana  Chest PT with bed percussion Nicotine Patch See ID  CARDIOVASCULAR A: No acute issues P: Tele Hold lasix  RENAL A:  No acute issues P:   Monitor BMET and UOP Replace electrolytes as needed  GASTROINTESTINAL A:  OG tube present P:   Continue tube feedings Continue famotidine for stress ulcer prophylaxis   HEMATOLOGIC A:  Hemoglobin stable.  P:  Transfuse to maintain Hgb > 7  INFECTIOUS A:  RML CAP  P:   ABX and cultures as above  Trend fever curve / WBC   ENDOCRINE A: No acute issues  P:   Daily BMP for glucose monitoring  NEUROLOGIC A:  No acute issues Acute Metabolic Encephalopathy - in the setting of hypercarbia   RASS goal: -2 On propofol gtt Fentanyl prn  FAMILY  - Updates: family updated 9/10 by phone  - Inter-disciplinary family meet or Palliative Care meeting due by: day 7  Cc time 38 minutes  Heber CarolinaBrent Beryle Bagsby, MD [place] [facility] Pager: [phone] Cell: [phone] After 3pm or if no response, call [phone]  01/03/16 12:52 PM

## 2016-01-03 NOTE — Progress Notes (Signed)
eLink Physician-Brief Progress Note Patient Name: Leslie Porter DOB: 11/07/1945 MRN: [id number]   Date of Service  01/03/2016  HPI/Events of Note  Multiple issues: 1. Blood glucose = 167 and 2. HR - 146 - Sinus Tachycardia. Last LVEF = 65% to 70%. Patient is being diuresed. Afebrile.  eICU Interventions  Will order: 1. Q 4 hour sensitive Novolog SSI. 2. D/C Lasix. 3. Bolus with 0.9 NaCl IV over 1 hour now.     Intervention Category Major Interventions: Hyperglycemia - active titration of insulin therapy  [name],Steven Eugene 01/03/2016, 6:27 PM

## 2016-01-03 NOTE — Progress Notes (Signed)
RN entered room and found patient to be grimacing and moving in bed. Heart rate was elevated into the 130's and oxygen saturations showed dampened wave form on the monitor. When removing the oxygen saturation probe from left forefinger, the patients hand was noted to be very edematous and pale with a purple discoloration on the anterior side.  Capillary refill was found to be < 3 seconds in all fingers and thumb and the radial pulse was 3+. The IV, infusing Azithromycin, was immediately stopped and removed. The patients hand was elevated and warm compresses applied to the anterior and posterior sides. After removal of the IV, clear fluid was leaking from the infiltrated site and a milky white fluid was found to be leaking from a presumed former IV site laterally. Pharmacy was consulted for the infiltration and interventions were deemed appropriate. Will continue to monitor.

## 2016-01-04 ENCOUNTER — Inpatient Hospital Stay (HOSPITAL_COMMUNITY): Payer: Medicare Other

## 2016-01-04 LAB — BASIC METABOLIC PANEL
ANION GAP: 8 (ref 5–15)
BUN: 34 mg/dL — ABNORMAL HIGH (ref 6–20)
CO2: 39 mmol/L — ABNORMAL HIGH (ref 22–32)
Calcium: 7.6 mg/dL — ABNORMAL LOW (ref 8.9–10.3)
Chloride: 93 mmol/L — ABNORMAL LOW (ref 101–111)
Creatinine, Ser: 0.67 mg/dL (ref 0.44–1.00)
GFR calc Af Amer: >60 mL/min (ref >60)
Glucose, Bld: 163 mg/dL — ABNORMAL HIGH (ref 65–99)
POTASSIUM: 4.3 mmol/L (ref 3.5–5.1)
SODIUM: 140 mmol/L (ref 135–145)

## 2016-01-04 LAB — CBC WITH DIFFERENTIAL/PLATELET
BASOS ABS: 0 10*3/uL (ref 0.0–0.1)
BASOS PCT: 0 %
Eosinophils Absolute: 0 10*3/uL (ref 0.0–0.7)
Eosinophils Relative: 0 %
HEMATOCRIT: 43.5 % (ref 36.0–46.0)
HEMOGLOBIN: 13.4 g/dL (ref 12.0–15.0)
LYMPHS PCT: 5 %
Lymphs Abs: 0.4 10*3/uL — ABNORMAL LOW (ref 0.7–4.0)
MCH: 32.9 pg (ref 26.0–34.0)
MCHC: 30.8 g/dL (ref 30.0–36.0)
MCV: 106.9 fL — AB (ref 78.0–100.0)
MONO ABS: 0.4 10*3/uL (ref 0.1–1.0)
Monocytes Relative: 5 %
NEUTROS ABS: 8.1 10*3/uL — AB (ref 1.7–7.7)
NEUTROS PCT: 90 %
Platelets: 263 10*3/uL (ref 150–400)
RBC: 4.07 MIL/uL (ref 3.87–5.11)
RDW: 13.2 % (ref 11.5–15.5)
WBC: 9 10*3/uL (ref 4.0–10.5)

## 2016-01-04 LAB — GLUCOSE, CAPILLARY
GLUCOSE-CAPILLARY: 171 mg/dL — AB (ref 65–99)
GLUCOSE-CAPILLARY: 161 mg/dL — AB (ref 65–99)
GLUCOSE-CAPILLARY: 157 mg/dL — AB (ref 65–99)
GLUCOSE-CAPILLARY: 134 mg/dL — AB (ref 65–99)
Glucose-Capillary: 147 mg/dL — ABNORMAL HIGH (ref 65–99)
Glucose-Capillary: 152 mg/dL — ABNORMAL HIGH (ref 65–99)

## 2016-01-04 LAB — TRIGLYCERIDES: TRIGLYCERIDES: 144 mg/dL (ref <150)

## 2016-01-04 LAB — LEGIONELLA PNEUMOPHILA SEROGP 1 UR AG: L. pneumophila Serogp 1 Ur Ag: NEGATIVE

## 2016-01-04 MED ORDER — SODIUM CHLORIDE 0.9 % IV SOLN
25.0000 ug/h | INTRAVENOUS | Status: DC
  Administered 2016-01-04: 25 ug/h via INTRAVENOUS
  Administered 2016-01-04: 50 ug/h via INTRAVENOUS
  Administered 2016-01-05: 300 ug/h via INTRAVENOUS
  Administered 2016-01-05: 25 ug/h via INTRAVENOUS
  Administered 2016-01-05: 200 ug/h via INTRAVENOUS
  Administered 2016-01-06: 50 ug/h via INTRAVENOUS
  Administered 2016-01-06: 175 ug/h via INTRAVENOUS
  Administered 2016-01-06: 150 ug/h via INTRAVENOUS
  Administered 2016-01-07: 225 ug/h via INTRAVENOUS
  Administered 2016-01-07: 275 ug/h via INTRAVENOUS
  Filled 2016-01-04 (×8): qty 50

## 2016-01-04 MED ORDER — FENTANYL BOLUS VIA INFUSION
25.0000 ug | INTRAVENOUS | Status: DC | PRN
  Administered 2016-01-04 – 2016-01-08 (×13): 25 ug via INTRAVENOUS
  Filled 2016-01-04: qty 25

## 2016-01-04 MED ORDER — VITAL AF 1.2 CAL PO LIQD
1000.0000 mL | ORAL | Status: DC
  Administered 2016-01-04 – 2016-01-06 (×4): 1000 mL
  Filled 2016-01-04 (×5): qty 1000

## 2016-01-04 MED ORDER — FENTANYL CITRATE (PF) 100 MCG/2ML IJ SOLN
50.0000 ug | Freq: Once | INTRAMUSCULAR | Status: AC
  Administered 2016-01-04: 50 ug via INTRAVENOUS
  Filled 2016-01-04: qty 2

## 2016-01-04 MED ORDER — FREE WATER
30.0000 mL | Freq: Three times a day (TID) | Status: DC
  Administered 2016-01-04 – 2016-01-07 (×8): 30 mL

## 2016-01-04 MED ORDER — METOPROLOL TARTRATE 5 MG/5ML IV SOLN
2.5000 mg | INTRAVENOUS | Status: DC | PRN
  Administered 2016-01-04: 5 mg via INTRAVENOUS
  Administered 2016-01-05: 2.5 mg via INTRAVENOUS
  Administered 2016-01-06 – 2016-01-07 (×4): 5 mg via INTRAVENOUS
  Administered 2016-01-07: 2.5 mg via INTRAVENOUS
  Administered 2016-01-08 (×2): 5 mg via INTRAVENOUS
  Administered 2016-01-08: 2.5 mg via INTRAVENOUS
  Administered 2016-01-08 – 2016-01-09 (×4): 5 mg via INTRAVENOUS
  Filled 2016-01-04 (×14): qty 5

## 2016-01-04 MED FILL — Medication: Qty: 1 | Status: AC

## 2016-01-04 NOTE — Progress Notes (Signed)
Date:  January 04, 2016 Chart reviewed for concurrent status and case management needs. Will continue to follow the patient for status change:  Remains on full vent support and iv sedation. Discharge Planning: following for needs Expected discharge date: [id number] Marcelle SmilingRhonda Davis, BSN, [place]3, [place]   [phone]

## 2016-01-04 NOTE — Progress Notes (Signed)
PULMONARY / CRITICAL CARE MEDICINE   Name: Leslie Porter MRN: [id number] DOB: 08/12/1945    ADMISSION DATE:  12/31/2015 CONSULTATION DATE:  01/01/16  REFERRING MD:  Dr. Ardyth Harps / [facility]  PCP: Dr. Antony Haste   CHIEF COMPLAINT:   SOB  BRIEF: 70 y/o female with advanced COPD still smoking who was admitted for acute respiratory failure with hypoxemia due to severe CAP and a COPD exacerbation. Required intubation 9/9.   SUBJECTIVE:   RN reports no acute events overnight.  Weaning on PSV 5/5.  Propofol just turned off - pt tachycardic / hypertensive.    VITAL SIGNS: BP (!) 183/54   Pulse (!) 123   Temp 97.7 F (36.5 C) (Axillary)   Resp 20   Ht 5\' 3"  (1.6 m)   Wt 145 lb 4.5 oz (65.9 kg)   SpO2 100%   BMI 25.74 kg/m   HEMODYNAMICS:    VENTILATOR SETTINGS: Vent Mode: PSV;CPAP FiO2 (%):  [40 %] 40 % Set Rate:  [15 bmp-30 bmp] 15 bmp Vt Set:  [320 mL] 320 mL PEEP:  [5 cmH20] 5 cmH20 Pressure Support:  [5 cmH20] 5 cmH20 Plateau Pressure:  [8 cmH20-20 cmH20] 8 cmH20  INTAKE / OUTPUT: I/O last 3 completed shifts: In: 2385 [I.V.:1018.9; NG/GT:616.2; IV Piggyback:750] Out: 3975 [Urine:3975]  Physical Exam: Temp:  [97.5 F (36.4 C)-99.3 F (37.4 C)] 97.7 F (36.5 C) (09/11 0731) Pulse Rate:  [87-129] 123 (09/11 0758) Resp:  [15-30] 20 (09/11 0758) BP: (102-185)/(34-82) 183/54 (09/11 0758) SpO2:  [88 %-100 %] 100 % (09/11 0700) FiO2 (%):  [40 %] 40 % (09/11 0800) Weight:  [145 lb 4.5 oz (65.9 kg)] 145 lb 4.5 oz (65.9 kg) (09/11 0400)  Gen: sedate on vent HENT: NCAT ETT in place PULM: improved air movement, wheezing bilaterally CV: RRR, no mgr GI: BS+, soft, nontender MSK: normal bulk and tone Extremities:  Mild edema in LUE from prior IV infiltration (propofol), no erythema or warmth Neuro: sedate, opens eyes to name  LABS:  BMET  Recent Labs Lab 01/01/16 0426 01/03/16 0344 01/04/16 0515  NA 134* 140 140  K 5.1 4.4 4.3  CL 99* 98* 93*  CO2 28 35* 39*   BUN 12 27* 34*  CREATININE 0.41* 0.68 0.67  GLUCOSE 144* 127* 163*    Electrolytes  Recent Labs Lab 01/01/16 0426 01/03/16 0344 01/04/16 0515  CALCIUM 8.1* 7.8* 7.6*    CBC  Recent Labs Lab 01/01/16 0529 01/03/16 0344 01/04/16 0515  WBC 10.3 14.0* 9.0  HGB 14.5 13.8 13.4  HCT 45.4 42.7 43.5  PLT 275 218 263    Coag's No results for input(s): APTT, INR in the last 168 hours.  Sepsis Markers No results for input(s): LATICACIDVEN, PROCALCITON, O2SATVEN in the last 168 hours.  ABG  Recent Labs Lab 01/01/16 1556 01/01/16 2139 01/02/16 0013  PHART 7.340* 7.329* 7.289*  PCO2ART 58.2* 58.4* 64.0*  PO2ART 87.6 77.0* 89.6    Liver Enzymes No results for input(s): AST, ALT, ALKPHOS, BILITOT, ALBUMIN in the last 168 hours.  Cardiac Enzymes No results for input(s): TROPONINI, PROBNP in the last 168 hours.  Glucose  Recent Labs Lab 01/03/16 1225 01/03/16 1628 01/03/16 1946 01/03/16 2339 01/04/16 0410 01/04/16 0728  GLUCAP 158* 167* 188* 146* 171* 147*    Imaging Dg Chest Port 1 View  Result Date: 01/04/2016 CLINICAL DATA:  Respiratory failure with hypoxemia. Ventilator support. EXAM: PORTABLE CHEST 1 VIEW COMPARISON:  01/03/2016 FINDINGS: Endotracheal tube has its tip 3 cm  above the carina. Nasogastric tube enters the abdomen. The lungs appear well aerated and clear. Aortic atherosclerosis noted. No bone abnormality. IMPRESSION: Endotracheal tube and nasogastric tube well positioned. Lungs clear. Electronically Signed   By: Paulina FusiMark  Shogry M.D.   On: 01/04/2016 07:26   STUDIES:  CTA Chest 9/7 >> neg for PE, segmental consolidative changes in the RML, no definite obstructing mass, emphysema ECHO 9/9 > LVEF 65-70%, grade 1 diastolic dysfunction   CULTURES: Sputum 9/7 >>  Strep Antigen 9/7 >> neg U. Legionella 9/7 >>  BCx2 9/7 >>  HIV 9/8 >> neg   ANTIBIOTICS: Azithro 9/7 >>  Rocephin 9/7 >>   SIGNIFICANT EVENTS: 9/07  Admit with respiratory  distress, RML consolidation   LINES/TUBES: ETT 9/7 >>   DISCUSSION: 70 y/o F, smoker, with PMH of COPD [facility] of [facility].  CTA neg for PE, found to have RML consolidation causing severe CAP, complicated by possible COPD exacerbation.   ASSESSMENT / PLAN:  PULMONARY A: Acute Hypercarbic Respiratory Failure - in setting of CAP RML CAP  COPD with exacerbation - spirometry 06/24/15 with severe airway obstruction & low vital capacity  Tobacco Abuse  P:   PRVC 8 cc/kg Wean PEEP / FiO2 for sats 88-92% Solumedrol 80 mg IV q12h  Nebulized pulmicort / brovana  Chest PT with bed percussion Nicotine Patch See ID  CARDIOVASCULAR A:  Tachycardia - likely reactive Hypertension - suspect related to agitation, no Hx P: Tele Hold lasix Add PRN metoprolol for HTN / tachycardia  RENAL A:   No acute issues P:   Monitor BMET and UOP Replace electrolytes as needed  GASTROINTESTINAL A:   OG tube present P:   Continue tube feedings Continue famotidine for stress ulcer prophylaxis   HEMATOLOGIC A:   Hemoglobin stable.  P:  Transfuse to maintain Hgb > 7  INFECTIOUS A:   RML CAP  LUE IV Infiltration - mild swelling, 9/10 P:   ABX and cultures as above  Trend fever curve / WBC  Elevate LUE, warm compress  ENDOCRINE A:  Hyperglycemia  P:   Daily BMP for glucose monitoring  NEUROLOGIC A:   Acute Metabolic Encephalopathy - in the setting of hypercarbia   Anxiety  P: RASS goal: 0 to -1 D/C propofol with rising triglycerides Fentanyl gtt for pain / sedation  PRN versed  FAMILY  - Updates: family updated 9/11 at bedside.  Sister / brother only family  - Inter-disciplinary family meet or Palliative Care meeting due by: day 7   Canary BrimBrandi Ollis, NP-C [place] [facility] Pgr: [phone] or if no answer [phone] 01/04/2016, 8:58 AM  Attending note: I have seen and examined the patient with nurse practitioner/resident and agree with  the note. History, labs and imaging reviewed.  70 Y/O with COPD, active smoker admitted with resp failure, RML CAP, AECOPD She is still on vent. Wheezing better today but did not do well on weaning trials.  - Continue ceftriaxone, azitho, steroids, nebs, chest PT - Daily weaning trials.  - Off propofol due to increasing TGs. Fentanyl gtt and versed PRN for sedation.  Rest of plan as above.  Critical care time - 40 mins. This represents my time independent of the NPs time taking care of the pt.  Chilton GreathousePraveen Anicia Leuthold MD [place] [facility] Pager [phone] If no answer or after 3pm call: [phone] 01/04/2016, 10:48 AM

## 2016-01-04 NOTE — Progress Notes (Signed)
Nutrition Follow-up  DOCUMENTATION CODES:   Not applicable  INTERVENTION:  - Will change TF regimen: Vital 1.2 @ 45 mL/hr with 30 mL free water TID. This regimen will provides 1296 kcal, 81 grams of protein, and 966 mL free water. - Will initiate PEPuP protocol. - RD will follow-up 9/12.  NUTRITION DIAGNOSIS:   Inadequate oral intake related to inability to eat as evidenced by NPO status. -ongoing  GOAL:   Patient will meet greater than or equal to 90% of their needs -unmet at this time.   MONITOR:   Vent status, TF tolerance, Weight trends, Labs, I & O's  ASSESSMENT:   70 y/o female with advanced COPD still smoking who was admitted for acute respiratory failure with hypoxemia due to severe CAP and a COPD exacerbation. Required intubation 9/9.  9/11 Pt with OGT and currently receiving Vital 1.2 @ 15 mL/hr with 5 packets of Prostat/day (60 mL BID + 30 mL once/day). This regimen provides 932 kcal, 102 grams of protein, and 487 mL free water.   RN reports pt tolerating TF without any noted issues. He states that Propofol is now off with no plan to restart d/t rising triglycerides. Will adjust TF as outlined above based on this event. Estimated kcal needs updated this AM. CBW consistent with admission weight.   Patient is currently intubated on ventilator support MV: 5 L/min Temp (24hrs), Avg:98.3 F (36.8 C), Min:97.5 F (36.4 C), Max:99.3 F (37.4 C) Propofol: OFF   Medications reviewed; sliding scale Novolog, 80 mg IV Solu-medrol BID, 15 mL daily multivitamin via OGT, PRN IV Zofran.  Labs reviewed; CBGs: 147 and 171 mg/dL this AM, Cl: 93 mmol/L, BUN: 34 mg/dL, Ca: 7.6 mg/dL, triglycerides this AM: 144 mg/dL.    9/10 - Per RN, pt is tolerating Vital HP @ 20 ml/hr with no issues.  - RN to complete infusion of Vital HP bottle then transition to Vital AF 1.2 @ 15 ml/hr.  - Will advance once Propofol infusion decreased.   Patient is currently intubated on ventilator  support MV: 9.3 L/min Temp (24hrs), Avg:98.8 F (37.1 C), Max:99.3 F (37.4 C) Propofol: 23.8 ml/hr -provides 628 kcal    Diet Order:  Diet NPO time specified  Skin:  Reviewed, no issues  Last BM:  PTA  Height:   Ht Readings from Last 1 Encounters:  01/01/16 5\' 3"  (1.6 m)    Weight:   Wt Readings from Last 1 Encounters:  01/04/16 145 lb 4.5 oz (65.9 kg)    Ideal Body Weight:  52.27 kg  BMI:  Body mass index is 25.74 kg/m.  Estimated Nutritional Needs:   Kcal:  1291  Protein:  79-99 grams (1.2-1.5 grams/kg)  Fluid:  >/= 1.5 L/day  EDUCATION NEEDS:   No education needs identified at this time    Trenton GammonJessica Laticha Ferrucci, MS, RD, LDN Inpatient Clinical Dietitian Pager # [phone] After hours/weekend pager # [phone]

## 2016-01-04 NOTE — Progress Notes (Signed)
OT Cancellation Note  Patient Details Name: Leslie Porter MRN: [id number] DOB: 05/22/1945   Cancelled Treatment:    Reason Eval/Treat Not Completed: Medical issues which prohibited therapy  Noted OT ordered [facility] and prior to intebation.  Please reorder as appropriate.   Alba CoryREDDING, Tiana Sivertson D 01/04/2016, 8:31 AM

## 2016-01-05 ENCOUNTER — Inpatient Hospital Stay (HOSPITAL_COMMUNITY): Payer: Medicare Other

## 2016-01-05 LAB — BLOOD GAS, ARTERIAL
ACID-BASE EXCESS: 13.5 mmol/L — AB (ref 0.0–2.0)
ACID-BASE EXCESS: 14.6 mmol/L — AB (ref 0.0–2.0)
BICARBONATE: 43.1 mmol/L — AB (ref 20.0–28.0)
Bicarbonate: 44.8 mmol/L — ABNORMAL HIGH (ref 20.0–28.0)
DRAWN BY: 422461
Drawn by: 276051
FIO2: 40
FIO2: 40
LHR: 15 {breaths}/min
LHR: 15 {breaths}/min
MECHVT: 320 mL
O2 SAT: 91.5 %
O2 SAT: 96.6 %
PATIENT TEMPERATURE: 98.6
PCO2 ART: 75.5 mmHg — AB (ref 32.0–48.0)
PEEP/CPAP: 5 cmH2O
PEEP/CPAP: 5 cmH2O
PH ART: 7.266 — AB (ref 7.350–7.450)
PH ART: 7.375 (ref 7.350–7.450)
Patient temperature: 98.3
VT: 420 mL
pCO2 arterial: 102 mmHg (ref 32.0–48.0)
pO2, Arterial: 67.8 mmHg — ABNORMAL LOW (ref 83.0–108.0)
pO2, Arterial: 85.7 mmHg (ref 83.0–108.0)

## 2016-01-05 LAB — GLUCOSE, CAPILLARY
GLUCOSE-CAPILLARY: 150 mg/dL — AB (ref 65–99)
GLUCOSE-CAPILLARY: 155 mg/dL — AB (ref 65–99)
GLUCOSE-CAPILLARY: 154 mg/dL — AB (ref 65–99)
GLUCOSE-CAPILLARY: 220 mg/dL — AB (ref 65–99)
Glucose-Capillary: 156 mg/dL — ABNORMAL HIGH (ref 65–99)
Glucose-Capillary: 217 mg/dL — ABNORMAL HIGH (ref 65–99)

## 2016-01-05 MED ORDER — SODIUM CHLORIDE 0.9% FLUSH: 10.0000 mL | INTRAVENOUS | Status: DC | PRN

## 2016-01-05 MED ORDER — BUDESONIDE 0.5 MG/2ML IN SUSP
0.5000 mg | Freq: Two times a day (BID) | RESPIRATORY_TRACT | Status: DC
  Administered 2016-01-05 – 2016-01-15 (×21): 0.5 mg via RESPIRATORY_TRACT
  Filled 2016-01-05 (×20): qty 2

## 2016-01-05 MED ORDER — DEXMEDETOMIDINE HCL IN NACL 200 MCG/50ML IV SOLN
0.4000 ug/kg/h | INTRAVENOUS | Status: AC
  Administered 2016-01-05 (×3): 0.5 ug/kg/h via INTRAVENOUS
  Administered 2016-01-05: 0.8 ug/kg/h via INTRAVENOUS
  Administered 2016-01-06: 1.2 ug/kg/h via INTRAVENOUS
  Administered 2016-01-06: 1 ug/kg/h via INTRAVENOUS
  Administered 2016-01-06 (×3): 1.2 ug/kg/h via INTRAVENOUS
  Filled 2016-01-05 (×9): qty 50

## 2016-01-05 MED ORDER — SODIUM CHLORIDE 0.9% FLUSH
10.0000 mL | Freq: Two times a day (BID) | INTRAVENOUS | Status: DC
  Administered 2016-01-05 – 2016-01-07 (×4): 10 mL
  Administered 2016-01-07: 20 mL
  Administered 2016-01-08 – 2016-01-11 (×6): 10 mL
  Administered 2016-01-11: 40 mL
  Administered 2016-01-12 – 2016-01-15 (×6): 10 mL

## 2016-01-05 NOTE — Progress Notes (Signed)
CPT held again due to sedation issues and Pt sitting straight up in bed .  RT will continue to monitor as needed.

## 2016-01-05 NOTE — Progress Notes (Signed)
CPT not done at this time due to sedation and agitation issues with Pt.  Pt is sitting straight up in bed and has to be restrained to prevent self extubation.  RN is currently titrating sedation and RT will re-evaluate when CPT is due at midnight.

## 2016-01-05 NOTE — Progress Notes (Signed)
Nutrition Follow-up  DOCUMENTATION CODES:   Not applicable  INTERVENTION:  - Continue Vital 1.2 @ 45 mL/hr with 30 mL free water TID. - Continue PEPuP protocol. - RD will follow-up 9/14.  NUTRITION DIAGNOSIS:   Inadequate oral intake related to inability to eat as evidenced by NPO status. -ongoing  GOAL:   Patient will meet greater than or equal to 90% of their needs -met with current TF regimen  MONITOR:   Vent status, TF tolerance, Weight trends, Labs, I & O's  ASSESSMENT:   70 y/o female with advanced COPD still smoking who was admitted for acute respiratory failure with hypoxemia due to severe CAP and a COPD exacerbation. Required intubation 9/9.  9/12 Pt continues with OGT and is currently receiving TF at goal: Vital 1.2 @ 45 mL/hr with 30 mL free water TID which is providing 1296 kcal, 81 grams of protein, and 966 mL free water. Weight remains stable and re-estimated kcal need this AM fairly consistent with value from yesterday. RD will follow-up 9/14.  Patient is currently intubated on ventilator support MV: 6.7 L/min Temp (24hrs), Avg:98.2 F (36.8 C), Min:97 F (36.1 C), Max:98.7 F (37.1 C) Propofol: remains off BP: 128/43 and MAP: 71 at time of RD visit  Medications reviewed; sliding scale Novolog, 15 mL liquids multivitamin per OGT/day, PRN IV Zofran.  Labs reviewed; CBGs: 150 and 156 mg/dL this AM, Cl: 93 mmol/L, BUN: 34 mg/dL, Ca: 7.6 mg/dL.  Drip: Fentanyl @ 175 mcg/hr.     9/11 - Pt with OGT and receiving Vital 1.2 @ 15 mL/hr with 5 packets of Prostat/day (60 mL BID + 30 mL once/day).  - This regimen provides 932 kcal, 102 grams of protein, and 487 mL free water.  - RN states that Propofol is now off with no plan to restart d/t rising triglycerides.  - Will adjust TF based on this event.  - Estimated kcal needs updated this AM. CBW consistent with admission weight.   Patient is currently intubated on ventilator support MV: 5 L/min Temp (24hrs),  Avg:98.3 F (36.8 C), Max:99.3 F (37.4 C) Propofol: off   9/10 - Per RN, pt is tolerating Vital HP @ 20 ml/hr with no issues.  - RN to complete infusion of Vital HP bottle then transition to Vital AF 1.2 @ 15 ml/hr.  - Will advance once Propofol infusion decreased.   Patient is currently intubated on ventilator support MV: 9.3L/min Temp (24hrs), Avg:98.8 F (37.1 C), Max:99.3 F (37.4 C) Propofol: 23.8 ml/hr -provides 628 kcal   Diet Order:  Diet NPO time specified  Skin:  Reviewed, no issues  Last BM:  PTA  Height:   Ht Readings from Last 1 Encounters:  01/01/16 _0  (1.6 m)    Weight:   Wt Readings from Last 1 Encounters:  01/04/16 145 lb 4.5 oz (65.9 kg)    Ideal Body Weight:  52.27 kg  BMI:  Body mass index is 25.74 kg/m.  Estimated Nutritional Needs:   Kcal:  1291  Protein:  79-99 grams (1.2-1.5 grams/kg)  Fluid:  >/= 1.5 L/day  EDUCATION NEEDS:   No education needs identified at this time    Jarome Matin, MS, RD, LDN Inpatient Clinical Dietitian Pager # [phone] After hours/weekend pager # [phone]

## 2016-01-05 NOTE — Progress Notes (Signed)
eLink Physician-Brief Progress Note Patient Name: Leslie Porter DOB: 06/01/1945 MRN: [id number]   Date of Service  01/05/2016  HPI/Events of Note  Respiratory acidosis on ABG.   eICU Interventions  Liberalize Vt to 8 cc/kg.      Intervention Category Major Interventions: Other:  Leslie Porter 01/05/2016, 4:49 AM

## 2016-01-05 NOTE — Progress Notes (Signed)
PULMONARY / CRITICAL CARE MEDICINE   Name: Leslie Porter MRN: [id number] DOB: 05/13/1945    ADMISSION DATE:  12/31/2015 CONSULTATION DATE:  01/01/16  REFERRING MD:  Dr. Ardyth HarpsHernandez / [facility]  PCP: Dr. Antony HasteMichael Badger   CHIEF COMPLAINT:   SOB  BRIEF: 70 y/o female with advanced COPD still smoking who was admitted for acute respiratory failure with hypoxemia due to severe CAP and a COPD exacerbation. Required intubation 9/9.   SUBJECTIVE:   RN reports pt more alert on fentanyl - has periods of intermittent agitation.  One IV, concerned she will dislodge IV, difficult placement.  Vent settings adjusted overnight for respiratory acidosis (7.26 / 102).    VITAL SIGNS: BP (!) 131/44 (BP Location: Left Arm)   Pulse 90   Temp 98.5 F (36.9 C) (Oral)   Resp 15   Ht 5\' 3"  (1.6 m)   Wt 145 lb 4.5 oz (65.9 kg)   SpO2 100%   BMI 25.74 kg/m   HEMODYNAMICS:    VENTILATOR SETTINGS: Vent Mode: PRVC FiO2 (%):  [40 %] 40 % Set Rate:  [15 bmp] 15 bmp Vt Set:  [320 mL-420 mL] 420 mL PEEP:  [5 cmH20] 5 cmH20 Plateau Pressure:  [12 cmH20-21 cmH20] 21 cmH20  INTAKE / OUTPUT: I/O last 3 completed shifts: In: 1859 [I.V.:803.5; NG/GT:655.5; IV Piggyback:400] Out: 3475 [Urine:3475]  Physical Exam: Temp:  [97 F (36.1 C)-98.7 F (37.1 C)] 98.5 F (36.9 C) (09/12 0412) Pulse Rate:  [88-112] 90 (09/12 0456) Resp:  [14-19] 15 (09/12 0800) BP: (103-193)/(37-81) 131/44 (09/12 0800) SpO2:  [96 %-100 %] 100 % (09/12 0800) FiO2 (%):  [40 %] 40 % (09/12 0800)  Gen: well developed adult female in NAD on vent HENT: NCAT, ETT in place PULM: non-labored, wheezing bilaterally CV: RRR, no mgr GI: BS+, soft, nontender MSK: normal bulk and tone Extremities:  Mild edema in LUE from prior IV infiltration (propofol), no erythema or warmth Neuro: sedate, opens eyes to name, no follow commands, makes brief eye contact, appears disoriented  LABS:  BMET  Recent Labs Lab 01/01/16 0426 01/03/16 0344  01/04/16 0515  NA 134* 140 140  K 5.1 4.4 4.3  CL 99* 98* 93*  CO2 28 35* 39*  BUN 12 27* 34*  CREATININE 0.41* 0.68 0.67  GLUCOSE 144* 127* 163*    Electrolytes  Recent Labs Lab 01/01/16 0426 01/03/16 0344 01/04/16 0515  CALCIUM 8.1* 7.8* 7.6*    CBC  Recent Labs Lab 01/01/16 0529 01/03/16 0344 01/04/16 0515  WBC 10.3 14.0* 9.0  HGB 14.5 13.8 13.4  HCT 45.4 42.7 43.5  PLT 275 218 263    Coag's No results for input(s): APTT, INR in the last 168 hours.  Sepsis Markers No results for input(s): LATICACIDVEN, PROCALCITON, O2SATVEN in the last 168 hours.  ABG  Recent Labs Lab 01/01/16 2139 01/02/16 0013 01/05/16 0430  PHART 7.329* 7.289* 7.266*  PCO2ART 58.4* 64.0* 102*  PO2ART 77.0* 89.6 67.8*    Liver Enzymes No results for input(s): AST, ALT, ALKPHOS, BILITOT, ALBUMIN in the last 168 hours.  Cardiac Enzymes No results for input(s): TROPONINI, PROBNP in the last 168 hours.  Glucose  Recent Labs Lab 01/04/16 1114 01/04/16 1521 01/04/16 1948 01/04/16 2326 01/05/16 0409 01/05/16 0751  GLUCAP 161* 157* 152* 134* 156* 150*    Imaging Dg Abd Portable 1v  Result Date: 01/05/2016 CLINICAL DATA:  OG tube placement EXAM: PORTABLE ABDOMEN - 1 VIEW COMPARISON:  None. FINDINGS: The orogastric tube extends into  the stomach. Tip is in the gastric body. Proximal port is just beyond the EG junction. IMPRESSION: OG tube extends into the stomach. Electronically Signed   By: Ellery Plunk M.D.   On: 01/05/2016 01:22   STUDIES:  CTA Chest 9/7 >> neg for PE, segmental consolidative changes in the RML, no definite obstructing mass, emphysema ECHO 9/9 > LVEF 65-70%, grade 1 diastolic dysfunction   CULTURES: Sputum 9/7 >>  Strep Antigen 9/7 >> neg U. Legionella 9/7 >>  BCx2 9/7 >> not completed  HIV 9/8 >> neg   ANTIBIOTICS: Azithro 9/7 >>  Rocephin 9/7 >>   SIGNIFICANT EVENTS: 9/07  Admit with respiratory distress, RML consolidation    LINES/TUBES: ETT 9/7 >>   DISCUSSION: 70 y/o F, smoker, with PMH of COPD admitted to [facility] on 9/7 with complaints of SOB.  CTA neg for PE, found to have RML consolidation causing severe CAP, complicated by possible COPD exacerbation.   ASSESSMENT / PLAN:  PULMONARY A: Acute Hypercarbic Respiratory Failure - in setting of CAP, CXR improved 9/11 RML CAP  COPD with exacerbation - spirometry 06/24/15 with severe airway obstruction & low vital capacity  Tobacco Abuse  P:   PRVC 8 cc/kg > vent adjusted for 8 cc / 420 Wean PEEP / FiO2 for sats 88-92% Solumedrol 80 mg IV q12h  Nebulized pulmicort / brovana  Duoneb Q6 + Q2 PRN Chest PT with bed percussion Nicotine Patch See ID  CARDIOVASCULAR A:  Tachycardia - likely reactive Hypertension - suspect related to agitation, no Hx Limited IV Access P: Tele Hold lasix PRN metoprolol for HTN / tachycardia May need midline cath / PICC   RENAL A:   No acute issues P:   Monitor BMET and UOP Replace electrolytes as needed  GASTROINTESTINAL A:   OG tube present P:   Continue tube feedings Continue famotidine for stress ulcer prophylaxis   HEMATOLOGIC A:   Hemoglobin stable.  P:  Transfuse to maintain Hgb > 7  INFECTIOUS A:   RML CAP - improved 9/11 LUE IV Infiltration - mild swelling, 9/10 P:   ABX and cultures as above  Trend fever curve / WBC  Elevate LUE, warm compress.  Monitor site  ENDOCRINE A:  Hyperglycemia  P:   Daily BMP for glucose monitoring  NEUROLOGIC A:   Acute Metabolic Encephalopathy - in the setting of hypercarbia   Anxiety  P: RASS goal: 0 to -1 Fentanyl gtt for pain / sedation  PRN versed  FAMILY  - Updates:  Sister / brother only family.  No family at bedside am 9/12.    - Inter-disciplinary family meet or Palliative Care meeting due by: day 7   Canary Brim, NP-C [place] [facility] Pgr: [phone] or if no answer [phone] 01/05/2016, 8:19 AM  Attending note: I  have seen and examined the patient with nurse practitioner/resident and agree with the note. History, labs and imaging reviewed.  70 Y/O with COPD, active smoker admitted with resp failure, RML CAP, AECOPD She is still on vent. Vent changes made for resp acidosis  - Continue ceftriaxone, azitho, steroids, nebs, chest PT - Daily weaning trials. Recheck ABG today - Fentanyl gtt and versed PRN for sedation.  Rest of plan as above.  Critical care time - 35 mins. This represents my time independent of the NPs time taking care of the pt.  Chilton Greathouse MD [place] [facility] Pager [phone] If no answer or after 3pm call:  [phone] 01/05/2016, 10:51 AM

## 2016-01-05 NOTE — Progress Notes (Signed)
[facility] Physician-Brief Progress Note Patient Name: Grace Bushyatricia Obrecht DOB: 08/31/1945 MRN: [id number]   Date of Service  01/05/2016  HPI/Events of Note  Agitated on precedex gtt.  eICU Interventions  Will try prn fentanyl.     Intervention Category Minor Interventions: Agitation / anxiety - evaluation and management  Dorothyann Gibbsrag Waleed Dettman 01/05/2016, 7:19 PM

## 2016-01-05 NOTE — Progress Notes (Signed)
eLink Physician-Brief Progress Note Patient Name: Leslie Porter DOB: 03/16/1946 MRN: [id number]   Date of Service  01/05/2016  HPI/Events of Note  ABG on PRVC 15/420/40% PEEP 5 : 7.37/75/85/96%  eICU Interventions  Continue current vent settings.        Dorothyann Gibbsrag [place] Stell 01/05/2016, 3:26 PM

## 2016-01-05 NOTE — Progress Notes (Signed)
Sister, brother and brother-in-law updated at bedside.  Plan of care reviewed with family.     Canary BrimBrandi Ardell Aaronson, NP-C [place] [facility] Pgr: [phone] or if no answer [phone] 01/05/2016, 9:06 AM

## 2016-01-05 NOTE — Progress Notes (Signed)
Peripherally Inserted Central Catheter/Midline Placement  The IV Nurse has discussed with the patient and/or persons authorized to consent for the patient, the purpose of this procedure and the potential benefits and risks involved with this procedure.  The benefits include less needle sticks, lab draws from the catheter, and the patient may be discharged home with the catheter. Risks include, but not limited to, infection, bleeding, blood clot (thrombus formation), and puncture of an artery; nerve damage and irregular heartbeat and possibility to perform a PICC exchange if needed/ordered by physician.  Alternatives to this procedure were also discussed.  [facility] PICC patient education guide, fact sheet on infection prevention and patient information card has been provided to patient /or left at bedside.    PICC/Midline Placement Documentation      Telephone consent by Valda LambSister, [place] , [name]  Carvin Almas Albarece 01/05/2016, 2:41 PM

## 2016-01-05 NOTE — Progress Notes (Signed)
Dr. Dema SeverinMungal contacted at [facility]. Notified that pt is extremely agitated right now, wide awake, trying to sit up, and attempting to pull at tubes/lines. He was made aware that RN from previous shift stopped fentanyl at 1810 due to Precedex running (per order). MD advised to start fentanyl back and titrate up as needed with precedex. Fentanyl restarted. Will continue to monitor.

## 2016-01-06 ENCOUNTER — Inpatient Hospital Stay (HOSPITAL_COMMUNITY): Payer: Medicare Other

## 2016-01-06 LAB — BLOOD GAS, ARTERIAL
Acid-Base Excess: 11.9 mmol/L — ABNORMAL HIGH (ref 0.0–2.0)
Acid-Base Excess: 11.7 mmol/L — ABNORMAL HIGH (ref 0.0–2.0)
BICARBONATE: 37.3 mmol/L — AB (ref 20.0–28.0)
Bicarbonate: 40.2 mmol/L — ABNORMAL HIGH (ref 20.0–28.0)
DRAWN BY: 441261
Drawn by: 422461
FIO2: 35
FIO2: 30
MECHVT: 420 mL
O2 SAT: 90.4 %
O2 Saturation: 93.2 %
PEEP: 5 cmH2O
PEEP: 5 cmH2O
PH ART: 7.366 (ref 7.350–7.450)
PO2 ART: 62.9 mmHg — AB (ref 83.0–108.0)
Patient temperature: 98.7
Patient temperature: 98.6
RATE: 15 resp/min
RATE: 24 resp/min
VT: 420 mL
pCO2 arterial: 72 mmHg (ref 32.0–48.0)
pCO2 arterial: 52.5 mmHg — ABNORMAL HIGH (ref 32.0–48.0)
pH, Arterial: 7.466 — ABNORMAL HIGH (ref 7.350–7.450)
pO2, Arterial: 60.7 mmHg — ABNORMAL LOW (ref 83.0–108.0)

## 2016-01-06 LAB — GLUCOSE, CAPILLARY
GLUCOSE-CAPILLARY: 203 mg/dL — AB (ref 65–99)
GLUCOSE-CAPILLARY: 174 mg/dL — AB (ref 65–99)
GLUCOSE-CAPILLARY: 120 mg/dL — AB (ref 65–99)
Glucose-Capillary: 122 mg/dL — ABNORMAL HIGH (ref 65–99)
Glucose-Capillary: 135 mg/dL — ABNORMAL HIGH (ref 65–99)
Glucose-Capillary: 142 mg/dL — ABNORMAL HIGH (ref 65–99)

## 2016-01-06 LAB — CBC
HCT: 42.8 % (ref 36.0–46.0)
Hemoglobin: 13.8 g/dL (ref 12.0–15.0)
MCH: 33.9 pg (ref 26.0–34.0)
MCHC: 32.2 g/dL (ref 30.0–36.0)
MCV: 105.2 fL — ABNORMAL HIGH (ref 78.0–100.0)
PLATELETS: 270 10*3/uL (ref 150–400)
RBC: 4.07 MIL/uL (ref 3.87–5.11)
RDW: 12.5 % (ref 11.5–15.5)
WBC: 13 10*3/uL — ABNORMAL HIGH (ref 4.0–10.5)

## 2016-01-06 MED ORDER — METHYLPREDNISOLONE SODIUM SUCC 40 MG IJ SOLR
40.0000 mg | Freq: Two times a day (BID) | INTRAMUSCULAR | Status: DC
  Administered 2016-01-06 – 2016-01-10 (×9): 40 mg via INTRAVENOUS
  Filled 2016-01-06 (×10): qty 1

## 2016-01-06 MED ORDER — QUETIAPINE FUMARATE 100 MG PO TABS
100.0000 mg | ORAL_TABLET | Freq: Two times a day (BID) | ORAL | Status: DC
  Administered 2016-01-06 – 2016-01-08 (×5): 100 mg via ORAL
  Filled 2016-01-06 (×5): qty 1

## 2016-01-06 NOTE — Progress Notes (Signed)
PULMONARY / CRITICAL CARE MEDICINE   Name: Leslie Porter MRN: [id number] DOB: Sep 24, 1945    ADMISSION DATE:  12/31/2015 CONSULTATION DATE:  01/01/16  REFERRING MD:  Dr. Ardyth Harps / [facility]  PCP: Dr. Antony Haste   CHIEF COMPLAINT:   SOB  BRIEF: 70 y/o female with advanced COPD still smoking who was admitted for acute respiratory failure with hypoxemia due to severe CAP and a COPD exacerbation. Required intubation 9/9.   SUBJECTIVE:   Started on precedex last night but still agitated. Fentanyl had to be restarted.  VITAL SIGNS: BP 101/80 (BP Location: Left Leg)   Pulse 73   Temp 98 F (36.7 C) (Oral)   Resp 16   Ht 5\' 3"  (1.6 m)   Wt 150 lb 12.7 oz (68.4 kg)   SpO2 100%   BMI 26.71 kg/m   HEMODYNAMICS:    VENTILATOR SETTINGS: Vent Mode: PRVC FiO2 (%):  [30 %-40 %] 35 % Set Rate:  [15 bmp] 15 bmp Vt Set:  [420 mL] 420 mL PEEP:  [5 cmH20] 5 cmH20 Plateau Pressure:  [14 cmH20-19 cmH20] 15 cmH20  INTAKE / OUTPUT: I/O last 3 completed shifts: In: 3605.1 [I.V.:1140.1; NG/GT:1715; IV Piggyback:750] Out: 1550 [Urine:1550]  Physical Exam: Temp:  [97.4 F (36.3 C)-99.2 F (37.3 C)] 98 F (36.7 C) (09/13 0800) Pulse Rate:  [73-119] 73 (09/13 0735) Resp:  [15-28] 16 (09/13 1000) BP: (101-205)/(41-87) 101/80 (09/13 1000) SpO2:  [95 %-100 %] 100 % (09/13 1000) FiO2 (%):  [30 %-40 %] 35 % (09/13 1000) Weight:  [150 lb 12.7 oz (68.4 kg)] 150 lb 12.7 oz (68.4 kg) (09/13 0500)  Gen: No distress HENT: NCAT, ETT in place Neuro: Sedated on examination, non resposive PULM: non-labored, Improved wheezing CV: RRR, no MRG GI: BS+, soft, nontender MSK: normal bulk and tone Extremities:  No edema  LABS:  BMET  Recent Labs Lab 01/01/16 0426 01/03/16 0344 01/04/16 0515  NA 134* 140 140  K 5.1 4.4 4.3  CL 99* 98* 93*  CO2 28 35* 39*  BUN 12 27* 34*  CREATININE 0.41* 0.68 0.67  GLUCOSE 144* 127* 163*    Electrolytes  Recent Labs Lab 01/01/16 0426 01/03/16 0344  01/04/16 0515  CALCIUM 8.1* 7.8* 7.6*    CBC  Recent Labs Lab 01/03/16 0344 01/04/16 0515 01/06/16 0348  WBC 14.0* 9.0 13.0*  HGB 13.8 13.4 13.8  HCT 42.7 43.5 42.8  PLT 218 263 270    Coag's No results for input(s): APTT, INR in the last 168 hours.  Sepsis Markers No results for input(s): LATICACIDVEN, PROCALCITON, O2SATVEN in the last 168 hours.  ABG  Recent Labs Lab 01/05/16 0430 01/05/16 1508 01/06/16 0415  PHART 7.266* 7.375 7.366  PCO2ART 102* 75.5* 72.0*  PO2ART 67.8* 85.7 60.7*    Liver Enzymes No results for input(s): AST, ALT, ALKPHOS, BILITOT, ALBUMIN in the last 168 hours.  Cardiac Enzymes No results for input(s): TROPONINI, PROBNP in the last 168 hours.  Glucose  Recent Labs Lab 01/05/16 1252 01/05/16 1609 01/05/16 2014 01/05/16 2350 01/06/16 0407 01/06/16 0834  GLUCAP 155* 154* 220* 217* 135* 203*    Imaging Dg Chest Port 1 View  Result Date: 01/06/2016 CLINICAL DATA:  Respiratory failure. EXAM: PORTABLE CHEST 1 VIEW COMPARISON:  Chest radiograph 01/04/2016 FINDINGS: Endotracheal tube tip is in unchanged position, well above the carina. The enteric tube courses beyond the field of view of the side port overlies the stomach. Left PICC line tip terminates at the right atrium. Atherosclerotic  calcification within the thoracic aorta. No pneumothorax or sizable pleural effusion. No pulmonary edema or focal airspace consolidation. IMPRESSION: 1. Endotracheal tube tip below level of the clavicular heads and well above the carina. 2. Left approach PICC line tip at the right atrium. Electronically Signed   By: Deatra RobinsonKevin  Herman M.D.   On: 01/06/2016 05:39   STUDIES:  CTA Chest 9/7 >> neg for PE, segmental consolidative changes in the RML, no definite obstructing mass, emphysema ECHO 9/9 > LVEF 65-70%, grade 1 diastolic dysfunction   CULTURES: Sputum 9/7 >>  Strep Antigen 9/7 >> neg U. Legionella 9/7 >>  BCx2 9/7 >> not completed  HIV 9/8 >> neg    ANTIBIOTICS: Azithro 9/7 >>  Rocephin 9/7 >>   SIGNIFICANT EVENTS: 9/07  Admit with respiratory distress, RML consolidation   LINES/TUBES: ETT 9/7 >>  PICC 9/12>   DISCUSSION: 70 y/o F, smoker, with PMH of COPD [facility] with complaints of SOB.  CTA neg for PE, found to have RML consolidation causing severe CAP, complicated by possible COPD exacerbation.   ASSESSMENT / PLAN:  PULMONARY A: Acute Hypercarbic Respiratory Failure - in setting of CAP, CXR improved 9/11 RML CAP  COPD with exacerbation - spirometry 06/24/15 with severe airway obstruction & low vital capacity  Tobacco Abuse  P:   PRVC 8 cc/kg > vent adjusted for 8 cc / 420 Increase RR to fix hypercarbia/ Wean PEEP / FiO2 for sats 88-92% Solumedrol. Reduce dose to 40 mg q12 Nebulized pulmicort / brovana  Duoneb Q6 + Q2 PRN Chest PT with bed percussion Nicotine Patch See ID  CARDIOVASCULAR A:  Tachycardia - likely reactive Hypertension - suspect related to agitation, no Hx Limited IV Access P: Tele Hold lasix PRN metoprolol for HTN / tachycardia  RENAL A:   No acute issues P:   Monitor BMET and UOP Replace electrolytes as needed  GASTROINTESTINAL A:   OG tube present P:   Continue tube feedings Continue famotidine for stress ulcer prophylaxis   HEMATOLOGIC A:   Hemoglobin stable.  P:  Transfuse to maintain Hgb > 7  INFECTIOUS A:   RML CAP - improved 9/11 LUE IV Infiltration - mild swelling, 9/10 P:   ABX and cultures as above  Trend fever curve / WBC   ENDOCRINE A:  Hyperglycemia  P:   Daily BMP for glucose monitoring  NEUROLOGIC A:   Acute Metabolic Encephalopathy - in the setting of hypercarbia   Anxiety  P: RASS goal: 0 to -1 Fentanyl, precedex gtt for pain / sedation  Start seroquel for agitation PRN versed  FAMILY  - Updates:  Sister / brother only family.  Updated 9/13. - Inter-disciplinary family meet or Palliative Care meeting due by: day  7.  Critical care time- 35 mins.  Chilton GreathousePraveen Carlean Crowl MD [place] [facility] Pager [phone] If no answer or after 3pm call: [phone] 01/06/2016, 11:00 AM

## 2016-01-07 ENCOUNTER — Inpatient Hospital Stay (HOSPITAL_COMMUNITY): Payer: Medicare Other

## 2016-01-07 LAB — CBC
HEMATOCRIT: 42.9 % (ref 36.0–46.0)
Hemoglobin: 13.4 g/dL (ref 12.0–15.0)
MCH: 32.7 pg (ref 26.0–34.0)
MCHC: 31.2 g/dL (ref 30.0–36.0)
MCV: 104.6 fL — ABNORMAL HIGH (ref 78.0–100.0)
PLATELETS: 282 10*3/uL (ref 150–400)
RBC: 4.1 MIL/uL (ref 3.87–5.11)
RDW: 13.3 % (ref 11.5–15.5)
WBC: 12.5 10*3/uL — AB (ref 4.0–10.5)

## 2016-01-07 LAB — BLOOD GAS, ARTERIAL
ACID-BASE EXCESS: 8.3 mmol/L — AB (ref 0.0–2.0)
BICARBONATE: 34.1 mmol/L — AB (ref 20.0–28.0)
Drawn by: 422461
FIO2: 30
LHR: 24 {breaths}/min
O2 Saturation: 94.6 %
PEEP/CPAP: 5 cmH2O
Patient temperature: 98.5
VT: 420 mL
pCO2 arterial: 53.5 mmHg — ABNORMAL HIGH (ref 32.0–48.0)
pH, Arterial: 7.42 (ref 7.350–7.450)
pO2, Arterial: 74.4 mmHg — ABNORMAL LOW (ref 83.0–108.0)

## 2016-01-07 LAB — CREATININE, SERUM: CREATININE: 0.44 mg/dL (ref 0.44–1.00)

## 2016-01-07 LAB — GLUCOSE, CAPILLARY
GLUCOSE-CAPILLARY: 153 mg/dL — AB (ref 65–99)
GLUCOSE-CAPILLARY: 139 mg/dL — AB (ref 65–99)
GLUCOSE-CAPILLARY: 154 mg/dL — AB (ref 65–99)
Glucose-Capillary: 133 mg/dL — ABNORMAL HIGH (ref 65–99)
Glucose-Capillary: 138 mg/dL — ABNORMAL HIGH (ref 65–99)

## 2016-01-07 LAB — MAGNESIUM: Magnesium: 2.1 mg/dL (ref 1.7–2.4)

## 2016-01-07 LAB — PHOSPHORUS: Phosphorus: 3 mg/dL (ref 2.5–4.6)

## 2016-01-07 MED ORDER — CLONAZEPAM 0.5 MG PO TABS
0.2500 mg | ORAL_TABLET | Freq: Two times a day (BID) | ORAL | Status: DC
  Administered 2016-01-07 – 2016-01-08 (×3): 0.25 mg via ORAL
  Filled 2016-01-07 (×3): qty 1

## 2016-01-07 MED ORDER — FREE WATER
100.0000 mL | Freq: Three times a day (TID) | Status: DC
  Administered 2016-01-07 – 2016-01-08 (×3): 100 mL

## 2016-01-07 MED ORDER — PRO-STAT SUGAR FREE PO LIQD
30.0000 mL | Freq: Every day | ORAL | Status: DC
  Administered 2016-01-07 – 2016-01-11 (×4): 30 mL
  Filled 2016-01-07 (×4): qty 30

## 2016-01-07 MED ORDER — VITAL 1.5 CAL PO LIQD
1000.0000 mL | ORAL | Status: DC
  Administered 2016-01-07 – 2016-01-10 (×3): 1000 mL
  Filled 2016-01-07 (×5): qty 1000

## 2016-01-07 NOTE — Progress Notes (Signed)
PULMONARY / CRITICAL CARE MEDICINE   Name: Leslie Porter MRN: [id number] DOB: 02/12/1946    ADMISSION DATE:  12/31/2015 CONSULTATION DATE:  01/01/16  REFERRING MD:  Dr. Ardyth HarpsHernandez / [facility]  PCP: Dr. Antony HasteMichael Badger   CHIEF COMPLAINT:   SOB  BRIEF: 70 y/o female with advanced COPD still smoking who was admitted for acute respiratory failure with hypoxemia due to severe CAP and a COPD exacerbation. Required intubation 9/9.   SUBJECTIVE:   RN reports no acute events.  Pt did not tolerate precedex (d/c'd) and fentanyl gtt restarted. Recently medicated with 2mg  versed.    VITAL SIGNS: BP (!) 118/52 (BP Location: Left Leg)   Pulse (!) 114   Temp 97.3 F (36.3 C) (Axillary)   Resp (!) 24   Ht 5\' 3"  (1.6 m)   Wt 151 lb 3.8 oz (68.6 kg)   SpO2 100%   BMI 26.79 kg/m   HEMODYNAMICS:    VENTILATOR SETTINGS: Vent Mode: PRVC FiO2 (%):  [30 %-35 %] 30 % Set Rate:  [24 bmp] 24 bmp Vt Set:  [420 mL] 420 mL PEEP:  [5 cmH20] 5 cmH20 Plateau Pressure:  [10 cmH20-17 cmH20] 17 cmH20  INTAKE / OUTPUT: I/O last 3 completed shifts: In: 3369.9 [I.V.:1119.9; NG/GT:1600; IV Piggyback:650] Out: 1200 [Urine:1200]  Physical Exam: Temp:  [97.3 F (36.3 C)-99.9 F (37.7 C)] 97.3 F (36.3 C) (09/14 0800) Pulse Rate:  [78-128] 114 (09/14 0700) Resp:  [14-30] 24 (09/14 0700) BP: (101-205)/(45-104) 118/52 (09/14 0700) SpO2:  [93 %-100 %] 100 % (09/14 0700) FiO2 (%):  [30 %-35 %] 30 % (09/14 0700) Weight:  [151 lb 3.8 oz (68.6 kg)] 151 lb 3.8 oz (68.6 kg) (09/14 0500)  Gen: No distress lying in bed HENT: NCAT, ETT in place Neuro: Sedated on examination, no response to verbal stimuli  PULM: non-labored, diminished bilaterally, improved wheeze CV: RRR, no MRG GI: BS+, soft, nontender MSK: normal bulk and tone Extremities:  No edema  LABS:  BMET  Recent Labs Lab 01/01/16 0426 01/03/16 0344 01/04/16 0515 01/07/16 0500  NA 134* 140 140  --   K 5.1 4.4 4.3  --   CL 99* 98* 93*  --   CO2  28 35* 39*  --   BUN 12 27* 34*  --   CREATININE 0.41* 0.68 0.67 0.44  GLUCOSE 144* 127* 163*  --     Electrolytes  Recent Labs Lab 01/01/16 0426 01/03/16 0344 01/04/16 0515 01/07/16 0500  CALCIUM 8.1* 7.8* 7.6*  --   MG  --   --   --  2.1  PHOS  --   --   --  3.0    CBC  Recent Labs Lab 01/04/16 0515 01/06/16 0348 01/07/16 0500  WBC 9.0 13.0* 12.5*  HGB 13.4 13.8 13.4  HCT 43.5 42.8 42.9  PLT 263 270 282    Coag's No results for input(s): APTT, INR in the last 168 hours.  Sepsis Markers No results for input(s): LATICACIDVEN, PROCALCITON, O2SATVEN in the last 168 hours.  ABG  Recent Labs Lab 01/06/16 0415 01/06/16 1325 01/07/16 0404  PHART 7.366 7.466* 7.420  PCO2ART 72.0* 52.5* 53.5*  PO2ART 60.7* 62.9* 74.4*    Liver Enzymes No results for input(s): AST, ALT, ALKPHOS, BILITOT, ALBUMIN in the last 168 hours.  Cardiac Enzymes No results for input(s): TROPONINI, PROBNP in the last 168 hours.  Glucose  Recent Labs Lab 01/06/16 1224 01/06/16 1630 01/06/16 1940 01/06/16 2339 01/07/16 0350 01/07/16 0732  GLUCAP  174* 120* 122* 142* 153* 133*    Imaging Dg Chest Port 1 View  Result Date: 01/07/2016 CLINICAL DATA:  Respiratory failure, community-acquired pneumonia intubated patient, history of COPD, current smoker. EXAM: PORTABLE CHEST 1 VIEW COMPARISON:  Portable chest x-ray of January 06, 2016 FINDINGS: The lungs remain mildly hyperinflated but clear. The heart and pulmonary vascularity are normal. There is calcification in the wall of the aortic arch. The endotracheal tube tip lies 4.1 cm above the carina. The esophagogastric tube tip projects below the inferior margin of the image. The left-sided PICC line tip projects over the midportion of the SVC. IMPRESSION: 1. COPD. No pneumonia, CHF, nor other acute cardiopulmonary abnormality. 2. Aortic atherosclerosis. 3. The support tubes are in reasonable position. Electronically Signed   By: David   [place] M.D.   On: 01/07/2016 07:30   STUDIES:  CTA Chest 9/7 >> neg for PE, segmental consolidative changes in the RML, no definite obstructing mass, emphysema ECHO 9/9 > LVEF 65-70%, grade 1 diastolic dysfunction   CULTURES: Sputum 9/7 >> not done Strep Antigen 9/7 >> neg U. Legionella 9/7 >> neg BCx2 9/7 >> not completed  HIV 9/8 >> neg   ANTIBIOTICS: Azithro 9/7 >> 9/13 Rocephin 9/7 >> 9/13  SIGNIFICANT EVENTS: 9/07  Admit with respiratory distress, RML consolidation   LINES/TUBES: ETT 9/7 >>  RUE PICC 9/12>   DISCUSSION: 70 y/o F, smoker, with PMH of COPD admitted to [facility] on 9/7 with complaints of SOB.  CTA neg for PE, found to have RML consolidation causing severe CAP, complicated by possible COPD exacerbation.   ASSESSMENT / PLAN:  PULMONARY A: Acute Hypercarbic Respiratory Failure - in setting of CAP, CXR improved 9/11 RML CAP  COPD with exacerbation - spirometry 06/24/15 with severe airway obstruction & low vital capacity  Tobacco Abuse  P:   PRVC 8 cc/kg > vent adjusted for 8 cc / 420 Increase RR to fix hypercarbia Wean PEEP / FiO2 for sats 88-92% Daily SBT / WUA  Solumedrol 40 mg q12 Nebulized pulmicort / brovana  Duoneb Q6 + Q2 PRN Chest PT with bed percussion Nicotine Patch See ID  CARDIOVASCULAR A:  Tachycardia - likely reactive Hypertension - suspect related to agitation, no Hx Limited IV Access P: Tele PRN metoprolol for HTN / tachycardia  RENAL A:   No acute issues P:   Monitor BMET and UOP Replace electrolytes as needed  GASTROINTESTINAL A:   OG tube present P:   Continue tube feedings Continue famotidine for stress ulcer prophylaxis   HEMATOLOGIC A:   Hemoglobin stable.  Macrocytosis P:  Transfuse to maintain Hgb > 7  INFECTIOUS A:   RML CAP - improved 9/11, abx d/c'd 9/13 LUE IV Infiltration - mild swelling, 9/10 P:   ABX and cultures as above  Trend fever curve / WBC   ENDOCRINE A:  Hyperglycemia  P:   Daily BMP  for glucose monitoring  NEUROLOGIC A:   Acute Metabolic Encephalopathy - in the setting of hypercarbia   Anxiety  P: RASS goal: 0 to -1 Fentanyl gtt for pain / sedation  Seroquel 100 mg BID  Add low dose klonopin - 0.25 mg BID Goal to get off fentanyl gtt PRN versed  FAMILY  - Updates:  Sister / brother only family.  No family available am 9/14 - Inter-disciplinary family meet or Palliative Care meeting due by: day 7.   Canary Brim, NP-C [place] [facility] Pgr: [phone] or if no answer [phone] 01/07/2016,  8:33 AM   Attending note: I have seen and examined the patient with nurse practitioner/resident and agree with the note. History, labs and imaging reviewed.  70 Y/O with COPD, active smoker admitted with resp failure, RML CAP, AECOPD She is still on vent. Agitation and mental status are barriers to extubation  - Continue ceftriaxone, azitho, steroids, nebs, chest PT - Daily weaning trials. - Fentanyl gtt and versed PRN for sedation. - Added Seroquel and klonopin for agitation  Rest of plan as above.  Critical care time - . This represents my time independent of the NPs time taking care of the pt.  Chilton Greathouse MD [place] [facility] Pager [phone] If no answer or after 3pm call: [phone] 01/07/2016, 10:41 AM

## 2016-01-07 NOTE — Progress Notes (Signed)
Date:  January 07, 2016 Chart reviewed for concurrent status and case management needs. Will continue to follow the patient for status change:  Full vent support and iv sedation. Discharge Planning: following for needs Expected discharge date: [id number] Marcelle SmilingRhonda Karysa Heft, BSN, [place]3, [place]   [phone]

## 2016-01-07 NOTE — Progress Notes (Signed)
Nutrition Follow-up  DOCUMENTATION CODES:   Not applicable  INTERVENTION:  - Will change TF regimen: Vital 1.5 @ 40 mL/hr with 30 mL Prostat once/day and 100 mL free water TID. This regimen will provide 1540 kcal, 80 grams of protein, and 1033 mL free water. - Continue PEPuP protocol. - RD will follow-up 9/15.  NUTRITION DIAGNOSIS:   Inadequate oral intake related to inability to eat as evidenced by NPO status. -ongoing  GOAL:   Patient will meet greater than or equal to 90% of their needs -met with current TF regimen.   MONITOR:   Vent status, TF tolerance, Weight trends, Labs, I & O's  ASSESSMENT:   70 y/o female with advanced COPD still smoking who was admitted for acute respiratory failure with hypoxemia due to severe CAP and a COPD exacerbation. Required intubation 9/9.  9/14 Pt with OGT and currently receiving TF at goal: Vital 1.2 @ 45 mL/hr with 30 mL free water TID which is providing 1296 kcal, 81 grams of protein, and 966 mL free water. Estimated kcal need updated this AM based on Ve and Tmax; weight from 01/04/16 (65.9 kg) used to calculate need as weight trending up since that time. Pt admitted 9/7 and no BM documented since admission.  Patient is currently intubated on ventilator support MV: 11.3 L/min Temp (24hrs), Avg:98.7 F (37.1 C), Min:97.3 F (36.3 C), Max:99.9 F (37.7 C) Propofol: none  Vent weaning began yesterday, per rounds yesterday. Will adjust TF regimen as outlined above to decrease rate in the setting of no BM since admission. RD will follow-up tomorrow.  Medications reviewed; sliding scale Novolgo, 40 mg Solu-medrol BID, 15 mL liquid multivitamin via OGT/day, PRN IV Zofran, 100 mg Colace BID PRN, 500 mg Ca carbonate via OGT/day.  Labs reviewed; CBGs: 133 and 153 mg/dL.    9/12 - Pt with OGT and is currently receiving TF at goal: Vital 1.2 @ 45 mL/hr with 30 mL free water TID which is providing 1296 kcal, 81 grams of protein, and 966 mL free  water.  - Weight remains stable and re-estimated kcal need this AM fairly consistent with value from yesterday.   Patient is currently intubated on ventilator support MV: 6.7 L/min Temp (24hrs), Avg:98.2 F (36.8 C), Min:97 F (36.1 C), Max:98.7 F (37.1 C) Propofol: remains off BP: 128/43 and MAP: 71 at time of RD visit  Drip: Fentanyl @ 175 mcg/hr.     9/11 - Pt with OGT and receiving Vital 1.2 @ 15 mL/hr with 5 packets of Prostat/day (60 mL BID + 30 mL once/day).  - This regimen provides 932 kcal, 102 grams of protein, and 487 mL free water.  - RN states that Propofol is now off with no plan to restart d/t rising triglycerides.  - Will adjust TF based on this event.  - Estimated kcal needs updated this AM. CBW consistent with admission weight.   Patient is currently intubated on ventilator support MV: 5L/min Temp (24hrs), Avg:98.3 F (36.8 C), Max:99.3 F (37.4 C) Propofol: off   Diet Order:  Diet NPO time specified  Skin:  Reviewed, no issues  Last BM:  PTA  Height:   Ht Readings from Last 1 Encounters:  01/01/16 5' 3"  (1.6 m)    Weight:   Wt Readings from Last 1 Encounters:  01/07/16 151 lb 3.8 oz (68.6 kg)    Ideal Body Weight:  52.27 kg  BMI:  Body mass index is 26.79 kg/m.  Estimated Nutritional Needs:  Kcal:  1536  Protein:  79-99 grams (1.2-1.5 grams/kg)  Fluid:  >/= 1.5 L/day  EDUCATION NEEDS:   No education needs identified at this time    Jarome Matin, MS, RD, LDN Inpatient Clinical Dietitian Pager # [phone] After hours/weekend pager # [phone]

## 2016-01-08 LAB — GLUCOSE, CAPILLARY
GLUCOSE-CAPILLARY: 226 mg/dL — AB (ref 65–99)
GLUCOSE-CAPILLARY: 151 mg/dL — AB (ref 65–99)
GLUCOSE-CAPILLARY: 113 mg/dL — AB (ref 65–99)
Glucose-Capillary: 102 mg/dL — ABNORMAL HIGH (ref 65–99)
Glucose-Capillary: 136 mg/dL — ABNORMAL HIGH (ref 65–99)
Glucose-Capillary: 140 mg/dL — ABNORMAL HIGH (ref 65–99)

## 2016-01-08 LAB — BASIC METABOLIC PANEL
Anion gap: 5 (ref 5–15)
BUN: 37 mg/dL — AB (ref 6–20)
CALCIUM: 8.4 mg/dL — AB (ref 8.9–10.3)
CHLORIDE: 100 mmol/L — AB (ref 101–111)
CO2: 32 mmol/L (ref 22–32)
CREATININE: 0.48 mg/dL (ref 0.44–1.00)
GFR calc non Af Amer: >60 mL/min (ref >60)
GLUCOSE: 237 mg/dL — AB (ref 65–99)
Potassium: 4.5 mmol/L (ref 3.5–5.1)
Sodium: 137 mmol/L (ref 135–145)

## 2016-01-08 LAB — CBC
HCT: 39.3 % (ref 36.0–46.0)
Hemoglobin: 12.3 g/dL (ref 12.0–15.0)
MCH: 32.5 pg (ref 26.0–34.0)
MCHC: 31.3 g/dL (ref 30.0–36.0)
MCV: 104 fL — AB (ref 78.0–100.0)
PLATELETS: 267 10*3/uL (ref 150–400)
RBC: 3.78 MIL/uL — AB (ref 3.87–5.11)
RDW: 13.5 % (ref 11.5–15.5)
WBC: 10.2 10*3/uL (ref 4.0–10.5)

## 2016-01-08 MED ORDER — LORAZEPAM 2 MG/ML IJ SOLN
1.0000 mg | INTRAMUSCULAR | Status: DC | PRN
  Administered 2016-01-08 (×2): 1 mg via INTRAVENOUS
  Filled 2016-01-08: qty 1

## 2016-01-08 MED ORDER — LORAZEPAM 2 MG/ML IJ SOLN
INTRAMUSCULAR | Status: AC
  Filled 2016-01-08: qty 1

## 2016-01-08 MED ORDER — LORAZEPAM 2 MG/ML IJ SOLN
0.5000 mg | INTRAMUSCULAR | Status: DC | PRN
  Administered 2016-01-08 – 2016-01-09 (×3): 1 mg via INTRAVENOUS
  Filled 2016-01-08 (×3): qty 1

## 2016-01-08 NOTE — Progress Notes (Signed)
220cc of morphine wasted in sink. Witnessed by Ezequiel KayserPaige Beck, RN.

## 2016-01-08 NOTE — Progress Notes (Addendum)
Nutrition Follow-up  DOCUMENTATION CODES:   Not applicable  INTERVENTION:  - Continue Vital 1.5 @ 40 mL/hr with 30 mL Prostat once/day and 100 mL free water TID.  - Continue PEPuP protocol. - RD will follow-up 9/18.  NUTRITION DIAGNOSIS:   Inadequate oral intake related to inability to eat as evidenced by NPO status. -ongoing  GOAL:   Patient will meet greater than or equal to 90% of their needs -met with current TF regimen.   MONITOR:   Vent status, TF tolerance, Weight trends, Labs, I & O's  ASSESSMENT:   70 y/o female with advanced COPD still smoking who was admitted for acute respiratory failure with hypoxemia due to severe CAP and a COPD exacerbation. Required intubation 9/9.  9/15 Pt continues with OGT and is receiving TF at goal rate: Vital 1.5 @ 40 mL/hr with 30 mL Prostat once/day and 100 mL free water TID. This regimen is providing 1540 kcal, 80 grams of protein, and 1033 mL free water. Estimated kcal need updated this AM and continue to use weight from 01/04/16 (65.9 kg) to estimate need as weight up since that time. Will continue to monitor weight trends and adjust was warranted. RD will follow-up 01/11/16; continue current TF regimen. Pt continues without BM since admission.   Patient is currently intubated on ventilator support MV: 11.2 L/min Temp (24hrs), Avg:99.2 F (37.3 C), Min:98.3 F (36.8 C), Max:99.9 F (37.7 C)   Medications reviewed; sliding scale Novolog, 40 mg IV Solu-medrol BID, 15 mL liquid multivitamin via OGT/day, PRN IV Zofran, 100 mg Colace BID PRN.  Labs reviewed; CBGs: 140 and 226 mg/dL this AM, Cl: 100 mmol/L, BUN: 37 mg/dL, Ca: 8.4 mg/dL.  ADDENDUM: Pt extubated <1 hour ago; TF held and OGT removed at that time. Estimated nutrition needs have been adjusted s/p extubation. RD will follow-up Monday.    9/14 - Pt with OGT and currently receiving TF at goal: Vital 1.2 @ 45 mL/hr with 30 mL free water TID which is providing 1296 kcal, 81  grams of protein, and 966 mL free water.  - Estimated kcal need updated this AM based on Ve and Tmax; weight from 01/04/16 (65.9 kg) used to calculate need as weight trending up since that time.  - Pt admitted 9/7 and no BM documented since admission. - Vent weaning began yesterday, per rounds yesterday.  - Will adjust TF regimen to decrease rate in the setting of no BM since admission.   Patient is currently intubated on ventilator support MV: 11.3 L/min Temp (24hrs), Avg:98.7 F (37.1 C), Max:99.9 F (37.7 C)    9/12 - Pt with OGT and is currently receiving TF at goal: Vital 1.2 @ 45 mL/hr with 30 mL free water TID which is providing 1296 kcal, 81 grams of protein, and 966 mL free water.  - Weight remains stable and re-estimated kcal need this AM fairly consistent with value from yesterday.   Patient is currently intubated on ventilator support MV: 6.7L/min Temp (24hrs), Avg:98.2 F (36.8 C), Max:98.7 F (37.1 C) Propofol: remains off BP: 128/43 and MAP: 71 at time of RD visit  Drip: Fentanyl @ 175 mcg/hr.     Diet Order:  Diet NPO time specified  Skin:  Reviewed, no issues  Last BM:  PTA  Height:   Ht Readings from Last 1 Encounters:  01/01/16 _0  (1.6 m)    Weight:   Wt Readings from Last 1 Encounters:  01/08/16 151 lb 14.4 oz (68.9 kg)  Ideal Body Weight:  52.27 kg  BMI:  Body mass index is 26.91 kg/m.  Estimated Nutritional Needs:   Kcal:  1320-1520  Protein:  65-75 grams  Fluid:  1.3-1.5 L/day  EDUCATION NEEDS:   No education needs identified at this time    Jarome Matin, MS, RD, LDN Inpatient Clinical Dietitian Pager # [phone] After hours/weekend pager # [phone]

## 2016-01-08 NOTE — Progress Notes (Signed)
eLink Physician-Brief Progress Note Patient Name: Leslie Porter DOB: 09/30/1945 MRN: [id number]   Date of Service  01/08/2016  HPI/Events of Note  Camera check on patient post extubation at 11:01 AM. Patient placed on BiPAP support for increased work of breathing. Respiratory rate fluctuating some up to 30 breaths per minute. Minimal support from BiPAP at this time. Eyes open but patient not following commands. Somewhat agitated moving around in bed spontaneously. Slightly hypertensive.   eICU Interventions  1. Continuing BiPAP for now for increased work of breathing 2. Close monitoring for further signs of decompensation and need for reintubation      Intervention Category Major Interventions: Respiratory failure - evaluation and management  Lawanda CousinsJennings Starnisha Porter 01/08/2016, 10:42 PM

## 2016-01-08 NOTE — Progress Notes (Signed)
PULMONARY / CRITICAL CARE MEDICINE   Name: Leslie Porter MRN: [id number] DOB: 04/10/1946    ADMISSION DATE:  12/31/2015 CONSULTATION DATE:  01/01/16  REFERRING MD:  Dr. Ardyth HarpsHernandez / [facility]  PCP: Dr. Antony HasteMichael Badger   CHIEF COMPLAINT:   SOB  BRIEF: 70 y/o female with advanced COPD still smoking who was admitted for acute respiratory failure with hypoxemia due to severe CAP and a COPD exacerbation. Required intubation 9/9.   SUBJECTIVE:    RN reports no acute events.  Fentanyl gtt increased to 275 mcg.  Family at bedside deny known patient alcohol use.    VITAL SIGNS: BP (!) 122/54   Pulse (!) 114   Temp 99.1 F (37.3 C) (Oral)   Resp (!) 24   Ht 5\' 3"  (1.6 m)   Wt 151 lb 14.4 oz (68.9 kg)   SpO2 100%   BMI 26.91 kg/m   HEMODYNAMICS:    VENTILATOR SETTINGS: Vent Mode: PRVC FiO2 (%):  [30 %] 30 % Set Rate:  [24 bmp] 24 bmp Vt Set:  [420 mL] 420 mL PEEP:  [5 cmH20] 5 cmH20 Plateau Pressure:  [17 cmH20-18 cmH20] 17 cmH20  INTAKE / OUTPUT: I/O last 3 completed shifts: In: 2701 [I.V.:1239.7; Other:100; NG/GT:1011.3; IV Piggyback:350] Out: 1550 [Urine:1550]  Physical Exam: Temp:  [98.3 F (36.8 C)-99.9 F (37.7 C)] 99.1 F (37.3 C) (09/15 0800) Resp:  [19-28] 24 (09/15 0800) BP: (100-210)/(42-87) 122/54 (09/15 0800) SpO2:  [97 %-100 %] 100 % (09/15 0800) FiO2 (%):  [30 %] 30 % (09/15 0335) Weight:  [151 lb 14.4 oz (68.9 kg)] 151 lb 14.4 oz (68.9 kg) (09/15 0401)  Gen: No distress lying in bed, more calm  HENT: NCAT, ETT in place Neuro: sedate but arouses to voice, nods appropriately, makes eye contact PULM: non-labored, good air movement bilaterally, clear breath sounds CV: RRR, no MRG GI: BS+, soft, nontender MSK: normal bulk and tone Extremities:  No edema  LABS:  BMET  Recent Labs Lab 01/03/16 0344 01/04/16 0515 01/07/16 0500 01/08/16 0350  NA 140 140  --  137  K 4.4 4.3  --  4.5  CL 98* 93*  --  100*  CO2 35* 39*  --  32  BUN 27* 34*  --  37*   CREATININE 0.68 0.67 0.44 0.48  GLUCOSE 127* 163*  --  237*    Electrolytes  Recent Labs Lab 01/03/16 0344 01/04/16 0515 01/07/16 0500 01/08/16 0350  CALCIUM 7.8* 7.6*  --  8.4*  MG  --   --  2.1  --   PHOS  --   --  3.0  --     CBC  Recent Labs Lab 01/06/16 0348 01/07/16 0500 01/08/16 0350  WBC 13.0* 12.5* 10.2  HGB 13.8 13.4 12.3  HCT 42.8 42.9 39.3  PLT 270 282 267    Coag's No results for input(s): APTT, INR in the last 168 hours.  Sepsis Markers No results for input(s): LATICACIDVEN, PROCALCITON, O2SATVEN in the last 168 hours.  ABG  Recent Labs Lab 01/06/16 0415 01/06/16 1325 01/07/16 0404  PHART 7.366 7.466* 7.420  PCO2ART 72.0* 52.5* 53.5*  PO2ART 60.7* 62.9* 74.4*    Liver Enzymes No results for input(s): AST, ALT, ALKPHOS, BILITOT, ALBUMIN in the last 168 hours.  Cardiac Enzymes No results for input(s): TROPONINI, PROBNP in the last 168 hours.  Glucose  Recent Labs Lab 01/07/16 0732 01/07/16 1120 01/07/16 1558 01/07/16 2122 01/08/16 0042 01/08/16 0417  GLUCAP 133* 139* 154* 138*  140* 226*    Imaging No results found.   STUDIES:  CTA Chest 9/7 >> neg for PE, segmental consolidative changes in the RML, no definite obstructing mass, emphysema ECHO 9/9 > LVEF 65-70%, grade 1 diastolic dysfunction   CULTURES: Sputum 9/7 >> not done Strep Antigen 9/7 >> neg U. Legionella 9/7 >> neg BCx2 9/7 >> not completed  HIV 9/8 >> neg   ANTIBIOTICS: Azithro 9/7 >> 9/13 Rocephin 9/7 >> 9/13  SIGNIFICANT EVENTS: 9/07  Admit with respiratory distress, RML consolidation  9/15  More calm/alert, trial weaning  LINES/TUBES: ETT 9/7 >>  RUE PICC 9/12 >>   DISCUSSION: 70 y/o F, smoker, with PMH of COPD admitted to [facility] on 9/7 with complaints of SOB.  CTA neg for PE, found to have RML consolidation causing severe CAP, complicated by possible COPD exacerbation.   ASSESSMENT / PLAN:  PULMONARY A: Acute Hypercarbic Respiratory Failure -  in setting of CAP, CXR improved 9/11 RML CAP  COPD with exacerbation - spirometry 06/24/15 with severe airway obstruction & low vital capacity  Tobacco Abuse  P:   PRVC 8 cc/kg > vent adjusted for 8 cc / 420 Wean PEEP / FiO2 for sats 88-92% Daily SBT / WUA  Solumedrol 40 mg q12 Nebulized pulmicort / brovana  Duoneb Q6 + Q2 PRN Chest PT with bed percussion Nicotine Patch See ID  CARDIOVASCULAR A:  Tachycardia - likely reactive Hypertension - suspect related to agitation, no Hx Limited IV Access P: Tele PRN metoprolol for HTN / tachycardia  RENAL A:   No acute issues P:   Monitor BMET and UOP Replace electrolytes as needed  GASTROINTESTINAL A:   OG tube present P:   Continue tube feedings Continue famotidine for stress ulcer prophylaxis   HEMATOLOGIC A:   Hemoglobin stable.  Macrocytosis P:  Transfuse to maintain Hgb > 7  INFECTIOUS A:   RML CAP - improved 9/11, abx d/c'd 9/13 LUE IV Infiltration - mild swelling, 9/10 P:   ABX and cultures as above  Trend fever curve / WBC   ENDOCRINE A:  Hyperglycemia  P:   Daily BMP for glucose monitoring  NEUROLOGIC A:   Acute Metabolic Encephalopathy - in the setting of hypercarbia   Anxiety  P: RASS goal: 0 to -1 Fentanyl gtt for pain / sedation  Seroquel 100 mg BID  Low dose klonopin - 0.25 mg BID Goal to get off fentanyl gtt, reduce ceiling dose to 200 mcg PRN versed  FAMILY  - Updates:  Sister / brother only family.  Family updated at bedside am 9/15.   - Inter-disciplinary family meet or Palliative Care meeting due by: day 7.   Canary Brim, NP-C [facility] Pgr: [phone] or if no answer [phone] 01/08/2016, 8:37 AM  Attending note: I have seen and examined the patient with nurse practitioner/resident and agree with the note. History, labs and imaging reviewed.  70 Y/O with COPD, active smoker admitted with acute resp failure, RML CAP, AECOPD She is doing better on  weaning trial today.  - Continue ceftriaxone, azitho, steroids, nebs, chest PT - Plan on extubation. Use Bipap as needed post extubation if she has resp trouble.  - Continue Seroquel and klonopin for agitation  Rest of plan as above.  Critical care time - . This represents my time independent of the NPs time taking care of the pt.   Chilton Greathouse MD [facility] Pager [phone] If no answer  or after 3pm call: [phone] 01/08/2016, 11:38 AM

## 2016-01-08 NOTE — Procedures (Signed)
Extubation Procedure Note  Patient Details:   Name: Grace Bushyatricia Blethen DOB: 06/18/1945 MRN: [id number]   Airway Documentation:     Evaluation  O2 sats: stable throughout Complications: No apparent complications Patient did tolerate procedure well. Bilateral Breath Sounds: Diminished, Expiratory wheezes  Pt placed on 3 lpm , SAT 93% Yes  Lulla Linville L 01/08/2016, 11:01 AM

## 2016-01-09 ENCOUNTER — Inpatient Hospital Stay (HOSPITAL_COMMUNITY): Payer: Medicare Other

## 2016-01-09 ENCOUNTER — Inpatient Hospital Stay (HOSPITAL_COMMUNITY): Payer: Medicare Other | Admitting: Anesthesiology

## 2016-01-09 LAB — BLOOD GAS, ARTERIAL
ACID-BASE EXCESS: 4.9 mmol/L — AB (ref 0.0–2.0)
Acid-Base Excess: 6.9 mmol/L — ABNORMAL HIGH (ref 0.0–2.0)
BICARBONATE: 31.4 mmol/L — AB (ref 20.0–28.0)
Bicarbonate: 31.9 mmol/L — ABNORMAL HIGH (ref 20.0–28.0)
DELIVERY SYSTEMS: POSITIVE
DRAWN BY: 331471
Drawn by: 235321
EXPIRATORY PAP: 6
FIO2: 30
FIO2: 40
INSPIRATORY PAP: 14
LHR: 15 {breaths}/min
MECHVT: 420 mL
MODE: POSITIVE
O2 SAT: 93.4 %
O2 Saturation: 89.6 %
PEEP/CPAP: 5 cmH2O
PH ART: 7.364 (ref 7.350–7.450)
PO2 ART: 75.1 mmHg — AB (ref 83.0–108.0)
Patient temperature: 98.6
Patient temperature: 98.6
pCO2 arterial: 47.5 mmHg (ref 32.0–48.0)
pCO2 arterial: 56.5 mmHg — ABNORMAL HIGH (ref 32.0–48.0)
pH, Arterial: 7.442 (ref 7.350–7.450)
pO2, Arterial: 57.8 mmHg — ABNORMAL LOW (ref 83.0–108.0)

## 2016-01-09 LAB — CBC
HCT: 41.4 % (ref 36.0–46.0)
HEMOGLOBIN: 13.6 g/dL (ref 12.0–15.0)
MCH: 33.1 pg (ref 26.0–34.0)
MCHC: 32.9 g/dL (ref 30.0–36.0)
MCV: 100.7 fL — AB (ref 78.0–100.0)
PLATELETS: 308 10*3/uL (ref 150–400)
RBC: 4.11 MIL/uL (ref 3.87–5.11)
RDW: 12.8 % (ref 11.5–15.5)
WBC: 16.3 10*3/uL — AB (ref 4.0–10.5)

## 2016-01-09 LAB — BASIC METABOLIC PANEL
ANION GAP: 4 — AB (ref 5–15)
BUN: 25 mg/dL — ABNORMAL HIGH (ref 6–20)
CHLORIDE: 100 mmol/L — AB (ref 101–111)
CO2: 34 mmol/L — ABNORMAL HIGH (ref 22–32)
Calcium: 8.4 mg/dL — ABNORMAL LOW (ref 8.9–10.3)
Creatinine, Ser: 0.4 mg/dL — ABNORMAL LOW (ref 0.44–1.00)
GFR calc Af Amer: >60 mL/min (ref >60)
Glucose, Bld: 126 mg/dL — ABNORMAL HIGH (ref 65–99)
POTASSIUM: 4.6 mmol/L (ref 3.5–5.1)
SODIUM: 138 mmol/L (ref 135–145)

## 2016-01-09 LAB — GLUCOSE, CAPILLARY
GLUCOSE-CAPILLARY: 103 mg/dL — AB (ref 65–99)
GLUCOSE-CAPILLARY: 114 mg/dL — AB (ref 65–99)
GLUCOSE-CAPILLARY: 115 mg/dL — AB (ref 65–99)
GLUCOSE-CAPILLARY: 120 mg/dL — AB (ref 65–99)
Glucose-Capillary: 154 mg/dL — ABNORMAL HIGH (ref 65–99)
Glucose-Capillary: 142 mg/dL — ABNORMAL HIGH (ref 65–99)
Glucose-Capillary: 117 mg/dL — ABNORMAL HIGH (ref 65–99)

## 2016-01-09 MED ORDER — FENTANYL CITRATE (PF) 100 MCG/2ML IJ SOLN
50.0000 ug | INTRAMUSCULAR | Status: AC | PRN
  Administered 2016-01-09 – 2016-01-11 (×3): 50 ug via INTRAVENOUS
  Filled 2016-01-09 (×2): qty 2

## 2016-01-09 MED ORDER — PROPOFOL 1000 MG/100ML IV EMUL
INTRAVENOUS | Status: AC
  Filled 2016-01-09: qty 100

## 2016-01-09 MED ORDER — PROPOFOL 1000 MG/100ML IV EMUL
0.0000 ug/kg/min | INTRAVENOUS | Status: DC
  Administered 2016-01-09: 50 ug/kg/min via INTRAVENOUS
  Administered 2016-01-09: 5 ug/kg/min via INTRAVENOUS
  Administered 2016-01-10 (×2): 50 ug/kg/min via INTRAVENOUS
  Administered 2016-01-10: 25 ug/kg/min via INTRAVENOUS
  Administered 2016-01-10: 40 ug/kg/min via INTRAVENOUS
  Administered 2016-01-11: 50 ug/kg/min via INTRAVENOUS
  Administered 2016-01-11 (×2): 20 ug/kg/min via INTRAVENOUS
  Administered 2016-01-11: 50 ug/kg/min via INTRAVENOUS
  Administered 2016-01-12: 30 ug/kg/min via INTRAVENOUS
  Filled 2016-01-09 (×11): qty 100

## 2016-01-09 MED ORDER — FENTANYL CITRATE (PF) 100 MCG/2ML IJ SOLN
INTRAMUSCULAR | Status: AC
  Filled 2016-01-09: qty 2

## 2016-01-09 MED ORDER — FENTANYL CITRATE (PF) 100 MCG/2ML IJ SOLN
50.0000 ug | INTRAMUSCULAR | Status: DC | PRN
  Administered 2016-01-09 – 2016-01-12 (×10): 50 ug via INTRAVENOUS
  Filled 2016-01-09 (×3): qty 2

## 2016-01-09 MED ORDER — SUCCINYLCHOLINE CHLORIDE 20 MG/ML IJ SOLN
INTRAMUSCULAR | Status: DC | PRN
Start: 2016-01-09 — End: 2016-01-09
  Administered 2016-01-09: 80 mg via INTRAVENOUS

## 2016-01-09 MED ORDER — HYDRALAZINE HCL 20 MG/ML IJ SOLN
10.0000 mg | INTRAMUSCULAR | Status: DC | PRN
  Administered 2016-01-09 – 2016-01-13 (×3): 10 mg via INTRAVENOUS
  Filled 2016-01-09 (×3): qty 1

## 2016-01-09 MED ORDER — PROPOFOL 10 MG/ML IV BOLUS
INTRAVENOUS | Status: AC
  Filled 2016-01-09: qty 20

## 2016-01-09 MED ORDER — PROPOFOL 10 MG/ML IV BOLUS
INTRAVENOUS | Status: DC | PRN
  Administered 2016-01-09: 100 mg via INTRAVENOUS

## 2016-01-09 MED ORDER — HALOPERIDOL LACTATE 5 MG/ML IJ SOLN
1.0000 mg | INTRAMUSCULAR | Status: DC | PRN
  Administered 2016-01-09 (×3): 2 mg via INTRAVENOUS
  Filled 2016-01-09 (×3): qty 1

## 2016-01-09 MED ORDER — ORAL CARE MOUTH RINSE
15.0000 mL | Freq: Four times a day (QID) | OROMUCOSAL | Status: DC
  Administered 2016-01-10 – 2016-01-15 (×15): 15 mL via OROMUCOSAL

## 2016-01-09 MED ORDER — CHLORHEXIDINE GLUCONATE 0.12% ORAL RINSE (MEDLINE KIT)
15.0000 mL | Freq: Two times a day (BID) | OROMUCOSAL | Status: DC
  Administered 2016-01-09 – 2016-01-14 (×9): 15 mL via OROMUCOSAL

## 2016-01-09 NOTE — Progress Notes (Signed)
PULMONARY / CRITICAL CARE MEDICINE   Name: Leslie Porter MRN: [id number] DOB: 12/01/1945    ADMISSION DATE:  12/31/2015 CONSULTATION DATE:  01/01/16  REFERRING MD:  Dr. Ardyth HarpsHernandez / [facility]  PCP: Dr. Antony HasteMichael Badger   CHIEF COMPLAINT:   SOB  BRIEF: 70 y/o female with advanced COPD still smoking who was admitted for acute respiratory failure with hypoxemia due to severe CAP and a COPD exacerbation. Required intubation 9/9.   SUBJECTIVE:    Extubated yesterday & required bipap overnight Afebrile No obvious pain Appears weak & deconditioned  VITAL SIGNS: BP 138/69   Pulse (!) 110   Temp 99.7 F (37.6 C) (Axillary)   Resp 16   Ht 5\' 3"  (1.6 m)   Wt 151 lb 14.4 oz (68.9 kg)   SpO2 96%   BMI 26.91 kg/m   HEMODYNAMICS:    VENTILATOR SETTINGS: Vent Mode: BIPAP FiO2 (%):  [30 %-50 %] 40 % Set Rate:  [15 bmp] 15 bmp PEEP:  [6 cmH20] 6 cmH20 Pressure Support:  [6 cmH20-12 cmH20] 8 cmH20 Plateau Pressure:  [7 cmH20] 7 cmH20  INTAKE / OUTPUT: I/O last 3 completed shifts: In: 1039.4 [I.V.:754.1; Other:100; NG/GT:135.3; IV Piggyback:50] Out: 2425 [Urine:2425]  Physical Exam: Temp:  [98.3 F (36.8 C)-99.7 F (37.6 C)] 99.7 F (37.6 C) (09/16 0800) Pulse Rate:  [110] 110 (09/15 2000) Resp:  [16-32] 16 (09/16 0900) BP: (129-228)/(52-146) 138/69 (09/16 0900) SpO2:  [92 %-100 %] 96 % (09/16 0900) FiO2 (%):  [30 %-50 %] 40 % (09/16 0802)  Gen: No distress lying in bed, Appears weak & deconditioned HENT: NCAT, bipap Neuro:awake, nods appropriately, makes eye contact, moves extremities PULM: non-labored, good air movement bilaterally, clear breath sounds, weak cough CV: RRR, no MRG GI: BS+, soft, nontender MSK: normal bulk and tone Extremities:  No edema  LABS:  BMET  Recent Labs Lab 01/04/16 0515 01/07/16 0500 01/08/16 0350 01/09/16 0500  NA 140  --  137 138  K 4.3  --  4.5 4.6  CL 93*  --  100* 100*  CO2 39*  --  32 34*  BUN 34*  --  37* 25*  CREATININE 0.67  0.44 0.48 0.40*  GLUCOSE 163*  --  237* 126*    Electrolytes  Recent Labs Lab 01/04/16 0515 01/07/16 0500 01/08/16 0350 01/09/16 0500  CALCIUM 7.6*  --  8.4* 8.4*  MG  --  2.1  --   --   PHOS  --  3.0  --   --     CBC  Recent Labs Lab 01/07/16 0500 01/08/16 0350 01/09/16 0500  WBC 12.5* 10.2 16.3*  HGB 13.4 12.3 13.6  HCT 42.9 39.3 41.4  PLT 282 267 308    Coag's No results for input(s): APTT, INR in the last 168 hours.  Sepsis Markers No results for input(s): LATICACIDVEN, PROCALCITON, O2SATVEN in the last 168 hours.  ABG  Recent Labs Lab 01/06/16 1325 01/07/16 0404 01/09/16 0150  PHART 7.466* 7.420 7.442  PCO2ART 52.5* 53.5* 47.5  PO2ART 62.9* 74.4* 57.8*    Liver Enzymes No results for input(s): AST, ALT, ALKPHOS, BILITOT, ALBUMIN in the last 168 hours.  Cardiac Enzymes No results for input(s): TROPONINI, PROBNP in the last 168 hours.  Glucose  Recent Labs Lab 01/08/16 1209 01/08/16 1610 01/08/16 2027 01/09/16 0011 01/09/16 0353 01/09/16 0750  GLUCAP 113* 136* 102* 115* 154* 120*    Imaging Dg Chest Port 1 View  Result Date: 01/09/2016 CLINICAL DATA:  Followup acute  respiratory failure. Subsequent encounter. History of asthma/ COPD. EXAM: PORTABLE CHEST 1 VIEW COMPARISON:  01/07/2016 FINDINGS: Lungs are hyperexpanded. There are prominent bronchovascular markings. No evidence of pneumonia or edema. Cardiac silhouette is normal in size. No mediastinal or hilar masses. Left PICC is stable and well positioned. The endotracheal tube and nasal/ orogastric tube have been removed. IMPRESSION: 1. Status post removal of the endotracheal and nasogastric tubes. 2. No acute cardiopulmonary disease. 3. Findings of COPD. Electronically Signed   By: Amie Portland M.D.   On: 01/09/2016 07:21     STUDIES:  CTA Chest 9/7 >> neg for PE, segmental consolidative changes in the RML, no definite obstructing mass, emphysema ECHO 9/9 > LVEF 65-70%, grade 1  diastolic dysfunction   CULTURES: Sputum 9/7 >> not done Strep Antigen 9/7 >> neg U. Legionella 9/7 >> neg BCx2 9/7 >> not completed  HIV 9/8 >> neg   ANTIBIOTICS: Azithro 9/7 >> 9/13 Rocephin 9/7 >> 9/13  SIGNIFICANT EVENTS: 9/07  Admit with respiratory distress, RML consolidation  9/15  More calm/alert, trial weaning 9/15 extuabted , bipap  LINES/TUBES: ETT 9/7 >> 9/15 RUE PICC 9/12 >>   DISCUSSION: 70 y/o F, smoker, with PMH of COPD admitted to [facility] on 9/7 with complaints of SOB.  CTA neg for PE, found to have RML consolidation causing severe CAP, complicated by possible COPD exacerbation.  Appears weak , on bipap post extubation, poor cough mechanics -watch in ICU & may need reintubation  ASSESSMENT / PLAN:  PULMONARY A: Acute Hypercarbic Respiratory Failure - in setting of CAP, CXR improved 9/11 RML CAP  COPD with exacerbation - spirometry 06/24/15 with severe airway obstruction & low vital capacity  Tobacco Abuse  P:   Ct Solumedrol 40 mg q12 Nebulized pulmicort / brovana  Duoneb Q6 + Q2 PRN Chest PT with bed percussion Nicotine Patch Bipap prn for WOB   CARDIOVASCULAR A:  Tachycardia - likely reactive Hypertension - suspect related to agitation, no Hx Limited IV Access P: PRN metoprolol for HTN / tachycardia  RENAL A:   No acute issues P:   Monitor BMET and UOP Replace electrolytes as needed  GASTROINTESTINAL A:   No issues P:   npo Continue famotidine for stress ulcer prophylaxis   HEMATOLOGIC A:   Hemoglobin stable.  Macrocytosis P:  Transfuse to maintain Hgb > 7  INFECTIOUS A:   RML CAP - improved 9/11, abx d/c'd 9/13 LUE IV Infiltration - mild swelling, 9/10 P:   Observe off abx   ENDOCRINE A:  Hyperglycemia  P:   Daily BMP for glucose monitoring  NEUROLOGIC A:   Acute Metabolic Encephalopathy - in the setting of hypercarbia   Anxiety  P: Dc seroquel / klonopin/ ativan Haldol prn for agitation  FAMILY  - Updates:   Sister / brother only family.   - Inter-disciplinary family meet or Palliative Care meeting due by: day 7.  My cc time x 31m  Cyril Mourning MD. FCCP. [facility] Pager [phone] If no response call [phone]   01/09/2016

## 2016-01-09 NOTE — Progress Notes (Signed)
eLink Physician-Brief Progress Note Patient Name: Leslie Porter DOB: 10/29/1945 MRN: [id number]   Date of Service  01/09/2016  HPI/Events of Note  RN contacted regarding HTN.   eICU Interventions  Hydralazine IV prn     Intervention Category Intermediate Interventions: Hypertension - evaluation and management  Lawanda CousinsJennings Noela Brothers 01/09/2016, 1:21 AM

## 2016-01-09 NOTE — Progress Notes (Signed)
MD took pt off BIPAP/NIV this am. Rt had to place pt back on due to AMS and WOB.

## 2016-01-09 NOTE — Anesthesia Procedure Notes (Signed)
Procedure Name: Intubation Date/Time: 01/09/2016 4:15 PM Performed by: Jairo BenJACKSON, Jashiya Bassett Pre-anesthesia Checklist: Patient identified, Emergency Drugs available, Suction available, Patient being monitored and Timeout performed Patient Re-evaluated:Patient Re-evaluated prior to inductionOxygen Delivery Method: Ambu bag Preoxygenation: Pre-oxygenation with 100% oxygen Intubation Type: IV induction Ventilation: Mask ventilation without difficulty Tube type: Subglottic suction tube Tube size: 7.5 mm Number of attempts: 1 Airway Equipment and Method: Video-laryngoscopy Placement Confirmation: ETT inserted through vocal cords under direct vision,  CO2 detector and breath sounds checked- equal and bilateral Secured at: 21 cm Tube secured with: Tape Dental Injury: Teeth and Oropharynx as per pre-operative assessment  Comments: Doreene BurkeBeth Marshall, CRNA performed the VideoLaryngoscopy

## 2016-01-09 NOTE — Progress Notes (Signed)
eLink Physician-Brief Progress Note Patient Name: Leslie Porter DOB: 12/15/1945 MRN: [id number]   Date of Service  01/09/2016  HPI/Events of Note  Significant resp distress Back on bipap x 1h  eICU Interventions  Requested anesthesia to intubate Vent/sedation orders placed     Intervention Category Major Interventions: Respiratory failure - evaluation and management  Leslie Porter V. 01/09/2016, 4:03 PM

## 2016-01-09 NOTE — Anesthesia Preprocedure Evaluation (Addendum)
Anesthesia Evaluation  Patient identified by MRN, date of birth, ID bandGeneral Assessment Comment:Pt sedated but responsive  Reviewed: Allergy & Precautions, NPO status , Patient's Chart, lab work & pertinent test results, Unable to perform ROS - Chart review only  History of Anesthesia Complications Negative for: history of anesthetic complications  Airway Mallampati: II  TM Distance: >3 FB Neck ROM: Full    Dental  (+) Chipped, Dental Advisory Given   Pulmonary shortness of breath, asthma , COPD,  COPD inhaler and oxygen dependent, Current Smoker,  Very tachypneic on BiPAP   breath sounds clear to auscultation + decreased breath sounds unstable     Cardiovascular  Rhythm:Regular Rate:Normal  9/17 ECHO: EF 65-70%, valves OK   Neuro/Psych negative neurological ROS     GI/Hepatic negative GI ROS, Neg liver ROS,   Endo/Other  negative endocrine ROS  Renal/GU negative Renal ROSK+ 4.6     Musculoskeletal   Abdominal   Peds  Hematology negative hematology ROS (+)   Anesthesia Other Findings   Reproductive/Obstetrics                          Anesthesia Physical Anesthesia Plan  ASA: IV and emergent  Anesthesia Plan: General   Post-op Pain Management:    Induction: Intravenous  Airway Management Planned: Video Laryngoscope Planned  Additional Equipment:   Intra-op Plan:   Post-operative Plan: Post-operative intubation/ventilation  Informed Consent: I have reviewed the patients History and Physical, chart, labs and discussed the procedure including the risks, benefits and alternatives for the proposed anesthesia with the patient or authorized representative who has indicated his/her understanding and acceptance.   Dental advisory given and History available from chart only  Plan Discussed with: CRNA  Anesthesia Plan Comments: (Plan routine monitors, induction for VideoGlide intubation)         Anesthesia Quick Evaluation

## 2016-01-09 NOTE — Progress Notes (Signed)
eLink Physician-Brief Progress Note Patient Name: Leslie Porter DOB: 04/13/1946 MRN: [id number]   Date of Service  01/09/2016  HPI/Events of Note  Camera check on patient continuing on BiPAP. Respiratory rate approximately 35 breaths per minute. Maintaining minute ventilation of 14 L/m. Normal saturation. Still with moderately increased work of breathing on BiPAP.   eICU Interventions  Checking ABG stat.      Intervention Category Major Interventions: Respiratory failure - evaluation and management  Lawanda CousinsJennings Nestor 01/09/2016, 1:54 AM

## 2016-01-09 NOTE — Progress Notes (Signed)
PT Cancellation Note  Patient Details Name: Grace Bushyatricia Younes MRN: [id number] DOB: 01/02/1946   Cancelled Treatment:    Reason Eval/Treat Not Completed: Patient not medically ready (will await  when tolerating off of BiPAP.)   Sharen HeckHill, Kamilla Hands Elizabeth Mattox Schorr PT [phone]  01/09/2016, 8:08 AM

## 2016-01-09 NOTE — Addendum Note (Signed)
Addendum  created 01/09/16 1701 by Jhonnie GarnerBeth M Jordin Vicencio, CRNA   Charge Capture section accepted, Diagnosis association updated, Visit diagnoses modified

## 2016-01-10 ENCOUNTER — Inpatient Hospital Stay (HOSPITAL_COMMUNITY): Payer: Medicare Other

## 2016-01-10 LAB — GLUCOSE, CAPILLARY
GLUCOSE-CAPILLARY: 165 mg/dL — AB (ref 65–99)
Glucose-Capillary: 190 mg/dL — ABNORMAL HIGH (ref 65–99)
Glucose-Capillary: 212 mg/dL — ABNORMAL HIGH (ref 65–99)
Glucose-Capillary: 193 mg/dL — ABNORMAL HIGH (ref 65–99)
Glucose-Capillary: 168 mg/dL — ABNORMAL HIGH (ref 65–99)
Glucose-Capillary: 86 mg/dL (ref 65–99)

## 2016-01-10 LAB — BASIC METABOLIC PANEL
Anion gap: 7 (ref 5–15)
BUN: 31 mg/dL — AB (ref 6–20)
CALCIUM: 8.1 mg/dL — AB (ref 8.9–10.3)
CHLORIDE: 101 mmol/L (ref 101–111)
CO2: 30 mmol/L (ref 22–32)
CREATININE: 0.42 mg/dL — AB (ref 0.44–1.00)
GFR calc Af Amer: >60 mL/min (ref >60)
GFR calc non Af Amer: >60 mL/min (ref >60)
GLUCOSE: 198 mg/dL — AB (ref 65–99)
POTASSIUM: 3.6 mmol/L (ref 3.5–5.1)
SODIUM: 138 mmol/L (ref 135–145)

## 2016-01-10 LAB — CBC
HCT: 38.7 % (ref 36.0–46.0)
HEMOGLOBIN: 12.5 g/dL (ref 12.0–15.0)
MCH: 33.2 pg (ref 26.0–34.0)
MCHC: 32.3 g/dL (ref 30.0–36.0)
MCV: 102.7 fL — AB (ref 78.0–100.0)
PLATELETS: 282 10*3/uL (ref 150–400)
RBC: 3.77 MIL/uL — AB (ref 3.87–5.11)
RDW: 13 % (ref 11.5–15.5)
WBC: 13.4 10*3/uL — AB (ref 4.0–10.5)

## 2016-01-10 LAB — TRIGLYCERIDES: Triglycerides: 98 mg/dL (ref <150)

## 2016-01-10 MED ORDER — SODIUM CHLORIDE 0.9 % IV SOLN
0.0000 ug/h | INTRAVENOUS | Status: DC
  Administered 2016-01-10: 25 ug/h via INTRAVENOUS
  Administered 2016-01-11: 200 ug/h via INTRAVENOUS
  Administered 2016-01-12: 125 ug/h via INTRAVENOUS
  Filled 2016-01-10 (×3): qty 50

## 2016-01-10 NOTE — Progress Notes (Signed)
OG tube good to use per Dr. Celene SkeenNester.

## 2016-01-10 NOTE — Progress Notes (Signed)
Physical Therapy Discharge Patient Details Name: Grace Bushyatricia Potier MRN: [id number] DOB: 05/10/1945 Today's Date: 01/10/2016 Time:  -     Patient discharged from PT services secondary to medical decline - will need to re-order PT to resume therapy services.    GP     Sharen HeckHill, Zahria Ding Elizabeth Ogden Handlin PT [phone]  01/10/2016, 7:05 AM

## 2016-01-10 NOTE — Progress Notes (Signed)
eLink Physician-Brief Progress Note Patient Name: Leslie Porter DOB: 11/15/1945 MRN: [id number]   Date of Service  01/10/2016  HPI/Events of Note  Reviewed portable Abd X-ray for Enteric tube placement. Tube passing along greater curvature of the stomach. Tip appears to enter the pylorus.   eICU Interventions  Enteric feeding tube okay to use.      Intervention Category Intermediate Interventions: Diagnostic test evaluation  [name] 01/10/2016, 1:14 AM

## 2016-01-10 NOTE — Progress Notes (Signed)
PULMONARY / CRITICAL CARE MEDICINE   Name: Leslie Porter MRN: [id number] DOB: 07/15/1945    ADMISSION DATE:  12/31/2015 CONSULTATION DATE:  01/01/16  REFERRING MD:  Dr. Ardyth Harps / [facility]  PCP: Dr. Antony Haste   CHIEF COMPLAINT:   SOB  BRIEF: 70 y/o female with advanced COPD still smoking who was admitted for acute respiratory failure with hypoxemia due to severe CAP and a COPD exacerbation. Required intubation 9/9.   SUBJECTIVE: Critically ill, intubated Int agitation     Afebrile No obvious pain Appears weak & deconditioned  VITAL SIGNS: BP (!) 95/41   Pulse 84   Temp 97.8 F (36.6 C) (Oral)   Resp (!) 24   Ht 5\' 3"  (1.6 m)   Wt 68.9 kg (151 lb 14.4 oz)   SpO2 97%   BMI 26.91 kg/m   HEMODYNAMICS:    VENTILATOR SETTINGS: Vent Mode: PRVC FiO2 (%):  [35 %-40 %] 40 % Set Rate:  [15 bmp] 15 bmp Vt Set:  [420 mL] 420 mL PEEP:  [5 cmH20-6 cmH20] 5 cmH20 Pressure Support:  [5 cmH20-8 cmH20] 5 cmH20 Plateau Pressure:  [14 cmH20-19 cmH20] 16 cmH20  INTAKE / OUTPUT: I/O last 3 completed shifts: In: 644.9 [I.V.:404.9; NG/GT:240] Out: 2370 [Urine:2370]  Physical Exam: Temp:  [97.8 F (36.6 C)-99 F (37.2 C)] 97.8 F (36.6 C) (09/17 0800) Pulse Rate:  [84-110] 84 (09/17 0308) Resp:  [14-48] 24 (09/17 0700) BP: (91-200)/(39-90) 95/41 (09/17 0700) SpO2:  [86 %-98 %] 97 % (09/17 0809) FiO2 (%):  [35 %-40 %] 40 % (09/17 0845)  Gen: No distress lying in bed, Appears weak & deconditioned HENT: no jvd, ETT Neuro:RASS-1 , makes eye contact, moves extremities PULM: non-labored, good air movement bilaterally, clear breath sounds, weak cough CV: RRR, no MRG GI: BS+, soft, nontender MSK: normal bulk and tone Extremities:  No edema  LABS:  BMET  Recent Labs Lab 01/08/16 0350 01/09/16 0500 01/10/16 0420  NA 137 138 138  K 4.5 4.6 3.6  CL 100* 100* 101  CO2 32 34* 30  BUN 37* 25* 31*  CREATININE 0.48 0.40* 0.42*  GLUCOSE 237* 126* 198*     Electrolytes  Recent Labs Lab 01/07/16 0500 01/08/16 0350 01/09/16 0500 01/10/16 0420  CALCIUM  --  8.4* 8.4* 8.1*  MG 2.1  --   --   --   PHOS 3.0  --   --   --     CBC  Recent Labs Lab 01/08/16 0350 01/09/16 0500 01/10/16 0420  WBC 10.2 16.3* 13.4*  HGB 12.3 13.6 12.5  HCT 39.3 41.4 38.7  PLT 267 308 282    Coag's No results for input(s): APTT, INR in the last 168 hours.  Sepsis Markers No results for input(s): LATICACIDVEN, PROCALCITON, O2SATVEN in the last 168 hours.  ABG  Recent Labs Lab 01/07/16 0404 01/09/16 0150 01/09/16 1710  PHART 7.420 7.442 7.364  PCO2ART 53.5* 47.5 56.5*  PO2ART 74.4* 57.8* 75.1*    Liver Enzymes No results for input(s): AST, ALT, ALKPHOS, BILITOT, ALBUMIN in the last 168 hours.  Cardiac Enzymes No results for input(s): TROPONINI, PROBNP in the last 168 hours.  Glucose  Recent Labs Lab 01/09/16 1156 01/09/16 1635 01/09/16 1921 01/09/16 2326 01/10/16 0426 01/10/16 0742  GLUCAP 114* 142* 117* 103* 190* 212*    Imaging Dg Abd 1 View  Result Date: 01/09/2016 CLINICAL DATA:  Nasogastric tube placement EXAM: ABDOMEN - 1 VIEW COMPARISON:  Portable exam 2257 hours compared to 01/05/2016  FINDINGS: Tip of nasogastric tube projects over distal gastric antrum/pylorus. Lungs appear hyperinflated but clear. Bowel gas pattern unremarkable. IMPRESSION: Tip of nasogastric tube projects over distal gastric antrum/pylorus. Electronically Signed   By: Ulyses Southward M.D.   On: 01/09/2016 23:19   Portable Chest Xray  Result Date: 01/10/2016 CLINICAL DATA:  70 y/o  F; acute respiratory failure. EXAM: PORTABLE CHEST 1 VIEW COMPARISON:  01/09/2016 chest radiograph. FINDINGS: Endotracheal tube is 3 cm from the carina. Enteric tube tip below the field of view in the abdomen. Left PICC line tip projects over lower SVC. Stable cardiomediastinal silhouette. Aortic atherosclerosis with arch calcifications. No new focal consolidation,  pneumothorax, or pleural effusion. No significant interval change. IMPRESSION: Stable lines and tubes.  Clear lungs. Electronically Signed   By: Mitzi Hansen M.D.   On: 01/10/2016 05:03   Portable Chest Xray  Result Date: 01/09/2016 CLINICAL DATA:  Intubation, acute respiratory failure, COPD, asthma, smoker EXAM: PORTABLE CHEST 1 VIEW COMPARISON:  Portable exam 1616 hours compared to 01/09/2016 at 0438 hours FINDINGS: Tip of endotracheal tube projects 3.3 cm above carina. LEFT arm PICC line tip projects over SVC. Normal heart size, mediastinal contours, and pulmonary vascularity. Atherosclerotic calcification aorta. Emphysematous changes compatible with COPD. No acute infiltrate, pleural effusion, or pneumothorax. Bones demineralized. IMPRESSION: Satisfactory endotracheal tube position. COPD changes without acute infiltrate. Aortic atherosclerosis. Electronically Signed   By: Ulyses Southward M.D.   On: 01/09/2016 16:47     STUDIES:  CTA Chest 9/7 >> neg for PE, segmental consolidative changes in the RML, no definite obstructing mass, emphysema ECHO 9/9 > LVEF 65-70%, grade 1 diastolic dysfunction   CULTURES:  Strep Antigen 9/7 >> neg U. Legionella 9/7 >> neg BCx2 9/7 >> not completed  HIV 9/8 >> neg  resp 9/17 >>  ANTIBIOTICS: Azithro 9/7 >> 9/13 Rocephin 9/7 >> 9/13  SIGNIFICANT EVENTS: 9/07  Admit with respiratory distress, RML consolidation  9/15  More calm/alert, trial weaning 9/15 extuabted , bipap  LINES/TUBES: ETT 9/7 >> 9/15, 9/16 >> RUE PICC 9/12 >>   DISCUSSION: 70 y/o F, smoker, with PMH of COPD admitted to [facility] on 9/7 with complaints of SOB.  CTA neg for PE, found to have RML consolidation causing severe CAP, complicated by possible COPD exacerbation.  Failed extubation after 24h & reintubated 9/16, would like to trial another trial of extubation if she reaches that point with wening  ASSESSMENT / PLAN:  PULMONARY A: Acute Hypercarbic Respiratory Failure -  in setting of CAP, CXR improved 9/11 RML CAP  COPD with exacerbation - spirometry 06/24/15 with severe airway obstruction & low vital capacity  Tobacco Abuse  P:   Ct Solumedrol 40 mg q12 Nebulized pulmicort / brovana  Duoneb Q6 + Q2 PRN Chest PT with bed percussion Nicotine Patch    CARDIOVASCULAR A:  Tachycardia - resolved Hypertension - suspect related to agitation, no Hx Limited IV Access P: PRN metoprolol for HTN / tachycardia  RENAL A:   No acute issues P:   Monitor BMET and UOP Replace electrolytes as needed  GASTROINTESTINAL A:   No issues P:   Start TFs Continue famotidine for stress ulcer prophylaxis   HEMATOLOGIC A:   Hemoglobin stable.  Macrocytosis P:  Transfuse to maintain Hgb > 7  INFECTIOUS A:   RML CAP - improved 9/11, abx d/c'd 9/13 LUE IV Infiltration - mild swelling, 9/10 P:   Observe off abx  Send resp cx  ENDOCRINE A:  Hyperglycemia  P:  Daily BMP for glucose monitoring  NEUROLOGIC A:   Acute Metabolic Encephalopathy - in the setting of hypercarbia   Anxiety  P: Ct propofol ,add fent gtt Haldol prn for agitation Off seroquel / klonopin/ ativan    FAMILY  - Updates:  Sister / brother only family.   - Inter-disciplinary family meet or Palliative Care meeting due by: day 7.  My cc time x 6117m  Cyril Mourningakesh Marjon Doxtater MD. FCCP. [place] [facility] Pager [phone] If no response call [phone]   01/10/2016

## 2016-01-11 ENCOUNTER — Inpatient Hospital Stay (HOSPITAL_COMMUNITY): Payer: Medicare Other

## 2016-01-11 LAB — CBC
HEMATOCRIT: 38.9 % (ref 36.0–46.0)
Hemoglobin: 12.7 g/dL (ref 12.0–15.0)
MCH: 32.9 pg (ref 26.0–34.0)
MCHC: 32.6 g/dL (ref 30.0–36.0)
MCV: 100.8 fL — AB (ref 78.0–100.0)
PLATELETS: 300 10*3/uL (ref 150–400)
RBC: 3.86 MIL/uL — ABNORMAL LOW (ref 3.87–5.11)
RDW: 12.9 % (ref 11.5–15.5)
WBC: 15.9 10*3/uL — ABNORMAL HIGH (ref 4.0–10.5)

## 2016-01-11 LAB — BASIC METABOLIC PANEL
Anion gap: 5 (ref 5–15)
BUN: 27 mg/dL — AB (ref 6–20)
CHLORIDE: 103 mmol/L (ref 101–111)
CO2: 32 mmol/L (ref 22–32)
CREATININE: 0.4 mg/dL — AB (ref 0.44–1.00)
Calcium: 8.7 mg/dL — ABNORMAL LOW (ref 8.9–10.3)
GFR calc Af Amer: >60 mL/min (ref >60)
GFR calc non Af Amer: >60 mL/min (ref >60)
Glucose, Bld: 185 mg/dL — ABNORMAL HIGH (ref 65–99)
Potassium: 3.7 mmol/L (ref 3.5–5.1)
Sodium: 140 mmol/L (ref 135–145)

## 2016-01-11 LAB — GLUCOSE, CAPILLARY
GLUCOSE-CAPILLARY: 136 mg/dL — AB (ref 65–99)
GLUCOSE-CAPILLARY: 146 mg/dL — AB (ref 65–99)
GLUCOSE-CAPILLARY: 110 mg/dL — AB (ref 65–99)
Glucose-Capillary: 167 mg/dL — ABNORMAL HIGH (ref 65–99)
Glucose-Capillary: 175 mg/dL — ABNORMAL HIGH (ref 65–99)
Glucose-Capillary: 131 mg/dL — ABNORMAL HIGH (ref 65–99)
Glucose-Capillary: 158 mg/dL — ABNORMAL HIGH (ref 65–99)

## 2016-01-11 MED ORDER — METHYLPREDNISOLONE SODIUM SUCC 40 MG IJ SOLR
20.0000 mg | Freq: Two times a day (BID) | INTRAMUSCULAR | Status: DC
  Administered 2016-01-11 – 2016-01-14 (×7): 20 mg via INTRAVENOUS
  Filled 2016-01-11 (×7): qty 1

## 2016-01-11 MED ORDER — RANITIDINE HCL 150 MG/10ML PO SYRP
150.0000 mg | ORAL_SOLUTION | Freq: Two times a day (BID) | ORAL | Status: DC
  Administered 2016-01-11 – 2016-01-14 (×6): 150 mg
  Filled 2016-01-11 (×10): qty 10

## 2016-01-11 MED ORDER — QUETIAPINE FUMARATE 100 MG PO TABS
100.0000 mg | ORAL_TABLET | Freq: Two times a day (BID) | ORAL | Status: DC
  Administered 2016-01-11 – 2016-01-12 (×3): 100 mg via ORAL
  Filled 2016-01-11 (×3): qty 1

## 2016-01-11 MED ORDER — FAMOTIDINE 40 MG/5ML PO SUSR: 20.0000 mg | Freq: Two times a day (BID) | ORAL | Status: DC

## 2016-01-11 MED ORDER — CLONAZEPAM 0.5 MG PO TABS
0.2500 mg | ORAL_TABLET | Freq: Two times a day (BID) | ORAL | Status: DC
  Administered 2016-01-11 – 2016-01-15 (×7): 0.25 mg via ORAL
  Filled 2016-01-11 (×7): qty 1

## 2016-01-11 MED ORDER — VITAL HIGH PROTEIN PO LIQD
1000.0000 mL | ORAL | Status: DC
  Administered 2016-01-11: 17:00:00
  Administered 2016-01-11: 1000 mL
  Administered 2016-01-11: 15:00:00
  Filled 2016-01-11 (×2): qty 1000

## 2016-01-11 MED ORDER — PRO-STAT SUGAR FREE PO LIQD
30.0000 mL | Freq: Two times a day (BID) | ORAL | Status: DC
  Administered 2016-01-11 – 2016-01-12 (×2): 30 mL
  Filled 2016-01-11 (×2): qty 30

## 2016-01-11 NOTE — Progress Notes (Signed)
PULMONARY / CRITICAL CARE MEDICINE   Name: Leslie Porter MRN: [id number] DOB: [birth year]    ADMISSION DATE:  12/31/2015 CONSULTATION DATE:  01/01/16  REFERRING MD:  Dr. Ardyth Harps / [facility]  PCP: Dr. Antony Haste   CHIEF COMPLAINT:   SOB  BRIEF: 70 y/o female with advanced COPD still smoking who was admitted for acute respiratory failure with hypoxemia due to severe CAP and a COPD exacerbation. Required intubation 9/9.   SUBJECTIVE:  RN reports significant anxiety with weaning - tachypnea, red face / distress with weaning.  No acute events overnight.   VITAL SIGNS: BP (!) 145/55 (BP Location: Right Arm)   Pulse (!) 115   Temp 97.8 F (36.6 C) (Oral)   Resp 16   Ht 5\' 3"  (1.6 m)   Wt 151 lb 14.4 oz (68.9 kg)   SpO2 93%   BMI 26.91 kg/m   HEMODYNAMICS:    VENTILATOR SETTINGS: Vent Mode: PRVC FiO2 (%):  [40 %] 40 % Set Rate:  [15 bmp] 15 bmp Vt Set:  [420 mL] 420 mL PEEP:  [5 cmH20] 5 cmH20 Pressure Support:  [10 cmH20] 10 cmH20 Plateau Pressure:  [13 cmH20-19 cmH20] 15 cmH20  INTAKE / OUTPUT: I/O last 3 completed shifts: In: 1504.1 [I.V.:1224.1; NG/GT:280] Out: 1320 [Urine:1320]  Physical Exam: Temp:  [97.8 F (36.6 C)-99.1 F (37.3 C)] 97.8 F (36.6 C) (09/18 0808) Pulse Rate:  [108-115] 115 (09/18 0308) Resp:  [15-29] 16 (09/18 0800) BP: (96-163)/(44-70) 145/55 (09/18 0800) SpO2:  [92 %-98 %] 93 % (09/18 0811) FiO2 (%):  [40 %] 40 % (09/18 0854)  Gen: chronically ill appearing female, on vent, appears uncomfortable  HENT: no jvd, ETT in place Neuro: RASS-1 to +1 , makes eye contact, moves extremities PULM: non-labored, good air movement bilaterally, clear breath sounds, weak cough CV: RRR, no MRG GI: BS+, soft, nontender MSK: normal bulk and tone Extremities:  No edema, small abrasion on nose from bipap mask  LABS:  BMET  Recent Labs Lab 01/09/16 0500 01/10/16 0420 01/11/16 0423  NA 138 138 140  K 4.6 3.6 3.7  CL 100* 101 103  CO2 34* 30 32   BUN 25* 31* 27*  CREATININE 0.40* 0.42* 0.40*  GLUCOSE 126* 198* 185*    Electrolytes  Recent Labs Lab 01/07/16 0500  01/09/16 0500 01/10/16 0420 01/11/16 0423  CALCIUM  --   < > 8.4* 8.1* 8.7*  MG 2.1  --   --   --   --   PHOS 3.0  --   --   --   --   < > = values in this interval not displayed.  CBC  Recent Labs Lab 01/09/16 0500 01/10/16 0420 01/11/16 0423  WBC 16.3* 13.4* 15.9*  HGB 13.6 12.5 12.7  HCT 41.4 38.7 38.9  PLT 308 282 300    Coag's No results for input(s): APTT, INR in the last 168 hours.  Sepsis Markers No results for input(s): LATICACIDVEN, PROCALCITON, O2SATVEN in the last 168 hours.  ABG  Recent Labs Lab 01/07/16 0404 01/09/16 0150 01/09/16 1710  PHART 7.420 7.442 7.364  PCO2ART 53.5* 47.5 56.5*  PO2ART 74.4* 57.8* 75.1*    Liver Enzymes No results for input(s): AST, ALT, ALKPHOS, BILITOT, ALBUMIN in the last 168 hours.  Cardiac Enzymes No results for input(s): TROPONINI, PROBNP in the last 168 hours.  Glucose  Recent Labs Lab 01/10/16 1610 01/10/16 1954 01/10/16 2344 01/11/16 0348 01/11/16 0353 01/11/16 0739  GLUCAP 168* 86 165* 167*  175* 136*    Imaging Dg Chest Port 1 View  Result Date: 01/11/2016 CLINICAL DATA:  70 year old female with respiratory failure. COPD, smoker. Initial encounter. EXAM: PORTABLE CHEST 1 VIEW COMPARISON:  01/10/2016 and earlier. FINDINGS: Portable AP semi upright view at 0450 hours. Stable endotracheal tube, visible enteric tube, and PICC line. Mediastinal contours remain normal. Calcified aortic atherosclerosis. Stable lung volumes. Allowing for portable technique the lungs are clear. No pneumothorax or pleural effusion. IMPRESSION: 1.  Stable lines and tubes. 2.  No acute cardiopulmonary abnormality. 3.  Calcified aortic atherosclerosis. Electronically Signed   By: Odessa Fleming M.D.   On: 01/11/2016 07:10     STUDIES:  CTA Chest 9/7 >> neg for PE, segmental consolidative changes in the RML, no  definite obstructing mass, emphysema ECHO 9/9 > LVEF 65-70%, grade 1 diastolic dysfunction   CULTURES:  Strep Antigen 9/7 >> neg U. Legionella 9/7 >> neg BCx2 9/7 >> not completed  HIV 9/8 >> neg  Resp 9/17 >>  ANTIBIOTICS: Azithro 9/7 >> 9/13 Rocephin 9/7 >> 9/13  SIGNIFICANT EVENTS: 9/07  Admit with respiratory distress, RML consolidation  9/15  More calm/alert, trial weaning 9/15  extuabted, bipap 9/16  Re-intubated   LINES/TUBES: ETT 9/7 >> 9/15, 9/16 >> RUE PICC 9/12 >>   DISCUSSION: 70 y/o F, smoker, with PMH of COPD admitted to [facility] on 9/7 with complaints of SOB.  CTA neg for PE, found to have RML consolidation causing severe CAP, complicated by possible COPD exacerbation. Failed extubation after 24h & reintubated 9/16, would like another trial of extubation if she reaches that point with weaning.    ASSESSMENT / PLAN:  PULMONARY A: Acute Hypercarbic Respiratory Failure - in setting of CAP, CXR improved 9/11 RML CAP  COPD with exacerbation - spirometry 06/24/15 with severe airway obstruction & low vital capacity  Tobacco Abuse  P:   Reduce Solumedrol to 20 mg q12 Nebulized pulmicort / brovana  Duoneb Q6 + Q2 PRN Chest PT with bed percussion Nicotine Patch Intermittent CXR  Family very helpful, would like to be here to help calm patient if she reaches point of extubation  BID WUA / SBT  CARDIOVASCULAR A:  Tachycardia  Hypertension - suspect related to agitation, no Hx Limited IV Access P: PRN metoprolol for HTN / tachycardia ICU monitoring of hemodynamics   RENAL A:   No acute issues P:   Monitor BMET and UOP Replace electrolytes as needed  GASTROINTESTINAL A:   No issues P:   TF per Nutrition Famotidine for stress ulcer prophylaxis   HEMATOLOGIC A:   Hemoglobin stable.  Macrocytosis P:  Transfuse to maintain Hgb > 7 Trend CBC  Lovenox for DVT prophylaxis   INFECTIOUS A:   RML CAP - improved 9/11, abx d/c'd 9/13 LUE IV Infiltration -  mild swelling 9/10, resolved P:   Observe off abx  Follow respiratory culture  ENDOCRINE A:  Hyperglycemia - in setting of steroid administration P:   Daily BMP for glucose monitoring  NEUROLOGIC A:   Acute Metabolic Encephalopathy - in the setting of hypercarbia   Anxiety  P: Fentanyl gtt  Did not tolerate propofol due to increased triglycerides in past > monitor closely  Haldol prn for agitation Resume seroquel / klonopin    FAMILY  - Updates:  Sister / brother only family. Updated at bedside 9/18.  Family in agreement that she would not want prolonged support / trach.  They are going to discuss the possibility of reintubation if  she fails a second time.  Goal for BID weaning and optimize.    - Inter-disciplinary family meet or Palliative Care meeting due by: day 7.   Canary BrimBrandi Ollis, NP-C [place] [facility] Pgr: [phone] or if no answer [phone] 01/11/2016, 9:43 AM

## 2016-01-11 NOTE — Progress Notes (Signed)
Nutrition Follow-up  DOCUMENTATION CODES:   Not applicable  INTERVENTION:  - Will change TF regimen: Vital High Protein @ 25 mL/hr with 30 mL Prostat BID. This regimen + kcal from Propofol will provide 1323 kcal, 83 grams of protein, and 502 mL free water.  - Continue PEPuP protocol. - Free water flush per MD/NP order.  - RD will follow-up 9/19.  NUTRITION DIAGNOSIS:   Inadequate oral intake related to inability to eat as evidenced by NPO status. -ongoing  GOAL:   Patient will meet greater than or equal to 90% of their needs -met with current regimen.  MONITOR:   Vent status, TF tolerance, Weight trends, Labs, I & O's  ASSESSMENT:   70 y/o female with advanced COPD still smoking who was admitted for acute respiratory failure with hypoxemia due to severe CAP and a COPD exacerbation. Required intubation 9/9.  9/18 Pt was placed on BiPAP 9/15 PM for increased work of breathing and subsequently re-intubated 9/16 PM for further respiratory decline. OGT replaced at that time and pt currently receiving TF per previous order: Vital 1.5 @ 40 mL/hr with 30 mL Prostat once/day which is providing 1540 kcal, 80 grams of protein, and 733 mL free water. Also receiving 20 mL free water every 4 hours which is providing 120 mL free water/day.   Patient is currently intubated on ventilator support MV: 6.5 L/min Temp (24hrs), Avg:98.6 F (37 C), Min:97.8 F (36.6 C), Max:99.1 F (37.3 C) Propofol: 19.8 ml/hr (523 kcal).   Will adjust TF regimen as outlined above d/t current kcal provided by Propofol. Re-estimated nutrition needs based on weight from 01/04/16 (65.9 kg) as that weight is consistent with admission weight and weight trending up over the past 1 week.   Medications reviewed; sliding scale Novolog, PRN IV Zofran. Labs reviewed; CBGs: 136-175 mg/dL this AM, BUN: 27 mg/dL, creatinine: 0.4 mg/dL, Ca: 8.7 mg/dL.  Drips: Fentanyl @ 200 mcg/hr, Propofol @ 50 mcg/kg/min.   9/15 - Pt  continues with OGT and is receiving TF at goal rate: Vital 1.5 @ 40 mL/hr with 30 mL Prostat once/day and 100 mL free water TID.  - This regimen is providing 1540 kcal, 80 grams of protein, and 1033 mL free water.  - Pt continues without BM since admission.   Patient is currently intubated on ventilator support MV: 11.2 L/min Temp (24hrs), Avg:99.2 F (37.3 C), Max:99.9 F (37.7 C)  ADDENDUM: Pt extubated <1 hour ago; TF held and OGT removed at that time. Estimated nutrition needs have been adjusted s/p extubation. RD will follow-up Monday.    9/14 - Pt with OGT and currently receiving TF at goal: Vital 1.2 @ 45 mL/hr with 30 mL free water TID which is providing 1296 kcal, 81 grams of protein, and 966 mL free water.  - Estimated kcal need updated this AM based on Ve and Tmax; weight from 01/04/16 (65.9 kg) used to calculate need as weight trending up since that time.  - Pt admitted 9/7 and no BM documented since admission. - Vent weaning began yesterday, per rounds yesterday.  - Will adjust TF regimen to decrease rate in the setting of no BM since admission.   Patient is currently intubated on ventilator support MV: 11.3L/min Temp (24hrs), Avg:98.7 F (37.1 C), Max:99.9 F (37.7 C)    Diet Order:  Diet NPO time specified  Skin:  Reviewed, no issues  Last BM:  PTA  Height:   Ht Readings from Last 1 Encounters:  01/01/16 5'  3" (1.6 m)    Weight:   Wt Readings from Last 1 Encounters:  01/08/16 151 lb 14.4 oz (68.9 kg)    Ideal Body Weight:  52.27 kg  BMI:  Body mass index is 26.91 kg/m.  Estimated Nutritional Needs:   Kcal:  1321  Protein:  79-99 grams (1.2-1.5 grams/kg)  Fluid:  1.3-1.5 L/day  EDUCATION NEEDS:   No education needs identified at this time    Jarome Matin, MS, RD, LDN Inpatient Clinical Dietitian Pager # [phone] After hours/weekend pager # [phone]

## 2016-01-11 NOTE — Progress Notes (Signed)
RT uanble to Wean PT at 1320 (passed SBT this morning but required sedation for severe agitation and discomfort). PT currently on 125 Fentynal, 10 Propafol, BP 96/36 (RN aware). PT not arousalable at this time.

## 2016-01-11 NOTE — Progress Notes (Signed)
Date:  January 11, 2016 Chart reviewed for concurrent status and case management needs. Will continue to follow the patient for status change: remains vent dependant Discharge Planning: following for needs Expected discharge date: [id number] Marcelle SmilingRhonda Davis, BSN, [email or web address], [place]   [phone]

## 2016-01-12 LAB — GLUCOSE, CAPILLARY
GLUCOSE-CAPILLARY: 114 mg/dL — AB (ref 65–99)
GLUCOSE-CAPILLARY: 138 mg/dL — AB (ref 65–99)
GLUCOSE-CAPILLARY: 161 mg/dL — AB (ref 65–99)
GLUCOSE-CAPILLARY: 161 mg/dL — AB (ref 65–99)
Glucose-Capillary: 157 mg/dL — ABNORMAL HIGH (ref 65–99)
Glucose-Capillary: 97 mg/dL (ref 65–99)
Glucose-Capillary: 129 mg/dL — ABNORMAL HIGH (ref 65–99)
Glucose-Capillary: 62 mg/dL — ABNORMAL LOW (ref 65–99)

## 2016-01-12 LAB — CK TOTAL AND CKMB (NOT AT ARMC)
CK TOTAL: 20 U/L — AB (ref 38–234)
CK, MB: 2 ng/mL (ref 0.5–5.0)
Relative Index: INVALID (ref 0.0–2.5)

## 2016-01-12 LAB — BASIC METABOLIC PANEL
ANION GAP: 5 (ref 5–15)
BUN: 33 mg/dL — ABNORMAL HIGH (ref 6–20)
CALCIUM: 8.3 mg/dL — AB (ref 8.9–10.3)
CO2: 32 mmol/L (ref 22–32)
Chloride: 102 mmol/L (ref 101–111)
Creatinine, Ser: 0.38 mg/dL — ABNORMAL LOW (ref 0.44–1.00)
GFR calc Af Amer: >60 mL/min (ref >60)
GLUCOSE: 154 mg/dL — AB (ref 65–99)
POTASSIUM: 3.8 mmol/L (ref 3.5–5.1)
SODIUM: 139 mmol/L (ref 135–145)

## 2016-01-12 LAB — CBC
HEMATOCRIT: 35.8 % — AB (ref 36.0–46.0)
HEMOGLOBIN: 11.6 g/dL — AB (ref 12.0–15.0)
MCH: 33.2 pg (ref 26.0–34.0)
MCHC: 32.4 g/dL (ref 30.0–36.0)
MCV: 102.6 fL — ABNORMAL HIGH (ref 78.0–100.0)
Platelets: 292 10*3/uL (ref 150–400)
RBC: 3.49 MIL/uL — ABNORMAL LOW (ref 3.87–5.11)
RDW: 13.1 % (ref 11.5–15.5)
WBC: 14.7 10*3/uL — AB (ref 4.0–10.5)

## 2016-01-12 LAB — TRIGLYCERIDES: TRIGLYCERIDES: 118 mg/dL (ref <150)

## 2016-01-12 LAB — PHOSPHORUS: Phosphorus: 3.5 mg/dL (ref 2.5–4.6)

## 2016-01-12 LAB — MAGNESIUM: MAGNESIUM: 1.8 mg/dL (ref 1.7–2.4)

## 2016-01-12 LAB — LACTIC ACID, PLASMA: LACTIC ACID, VENOUS: 0.8 mmol/L (ref 0.5–1.9)

## 2016-01-12 MED ORDER — SODIUM CHLORIDE 0.9 % IV BOLUS (SEPSIS)
500.0000 mL | Freq: Once | INTRAVENOUS | Status: AC
  Administered 2016-01-12: 500 mL via INTRAVENOUS

## 2016-01-12 MED ORDER — DEXTROSE 50 % IV SOLN
INTRAVENOUS | Status: AC
  Administered 2016-01-12: 50 mL
  Filled 2016-01-12: qty 50

## 2016-01-12 MED ORDER — DEXTROSE 5 % IV SOLN
2.0000 ug/min | INTRAVENOUS | Status: DC
  Administered 2016-01-12: 2 ug/min via INTRAVENOUS
  Filled 2016-01-12: qty 4

## 2016-01-12 MED ORDER — FOLIC ACID 1 MG PO TABS
1.0000 mg | ORAL_TABLET | Freq: Every day | ORAL | Status: DC
  Administered 2016-01-12 – 2016-01-15 (×3): 1 mg via ORAL
  Filled 2016-01-12 (×3): qty 1

## 2016-01-12 MED ORDER — VITAL HIGH PROTEIN PO LIQD
1000.0000 mL | ORAL | Status: DC
  Filled 2016-01-12: qty 1000

## 2016-01-12 NOTE — Progress Notes (Signed)
eLink Physician-Brief Progress Note Patient Name: Leslie Porter DOB: 03/08/1946 MRN: [id number]   Date of Service  01/12/2016  HPI/Events of Note  BP did not respond to fluids  eICU Interventions  Start Levophed     Intervention Category Intermediate Interventions: Hypotension - evaluation and management  Ainsley Sanguinetti 01/12/2016, 2:03 AM

## 2016-01-12 NOTE — Progress Notes (Signed)
Nutrition Follow-up  DOCUMENTATION CODES:   Not applicable  INTERVENTION:  - Will change TF order: Vital High Protein @ 45 mL/hr and will d/c Prostat. This regimen + kcal from Propofol will provide 1244 kcal (92% estimated kcal need), 90 grams of protein, and 903 mL free water.  - Continue PEPuP protocol.  - Continue to follow free water flush order per MD/NP. - RD will follow-up 9/20.  NUTRITION DIAGNOSIS:   Inadequate oral intake related to inability to eat as evidenced by NPO status. -ongoing  GOAL:   Patient will meet greater than or equal to 90% of their needs -met with current regimen.  MONITOR:   Vent status, TF tolerance, Weight trends, Labs, I & O's  ASSESSMENT:   70 y/o female with advanced COPD still smoking who was admitted for acute respiratory failure with hypoxemia due to severe CAP and a COPD exacerbation. Required intubation 9/9.  9/19 Pt continues with OGT with order for Vital High Protein @ 25 mL/hr with 30 mL Prostat BID. This regimen provides 800 kcal, 83 grams of protein, and 502 mL free water. At time of RD visit pt receiving Vital High Protein @ 40 mL/hr which is providing 960 kcal, 84 grams of protein, and 803 mL free water. Pt also receiving free water flush per MD order: 20 mL every 4 hours which is providing 120 mL/day. Estimated kcal need updated this AM and continue to use 65.9 kg to estimate needs. Weight now beginning to trend down toward admission weight.   Pt able to nod/shake head and indicates that she is not having abdominal pain or nausea at this time. Will adjust TF regimen as outlined above based on re-estimated kcal need and current Propofol rate.   Patient is currently intubated on ventilator support MV: 7.6 L/min Temp (24hrs), Avg:98 F (36.7 C), Min:96.6 F (35.9 C), Max:99.1 F (37.3 C) Propofol: 6.2 ml/hr (164 kcal).   Medications reviewed; 1 mg folic acid/day, sliding scale Novolog, 20 mg IV Solu-medrol BID, PRN IV Zofran. Labs  reviewed; CBGs: 97-161 mg/dL, BUN: 33 mg/dL, creatinine: 0.38 mg/dL, Ca: 8.3 mg/dL.  Drips: Fentanyl @ 75 mcg/hr, Propofol @ 15 mcg/kg/min.    9/18 - Pt was placed on BiPAP 9/15 PM for increased work of breathing and subsequently re-intubated 9/16 PM for further respiratory decline.  - OGT replaced at that time and pt currently receiving TF per previous order: Vital 1.5 @ 40 mL/hr with 30 mL Prostat once/day which is providing 1540 kcal, 80 grams of protein, and 733 mL free water.  - Also receiving 20 mL free water every 4 hours which is providing 120 mL free water/day.  - Re-estimated nutrition needs based on weight from 01/04/16 (65.9 kg) as that weight is consistent with admission weight and weight trending up over the past 1 week. - Will adjust TF regimen d/t current kcal provided by Propofol.    Patient is currently intubated on ventilator support MV: 6.5 L/min Temp (24hrs), Avg:98.6 F (37 C), Max:99.1 F (37.3 C) Propofol: 19.8 ml/hr (523 kcal).   Drips: Fentanyl @ 200 mcg/hr, Propofol @ 50 mcg/kg/min.   9/15 - Pt continues with OGT and is receiving TF at goal rate: Vital 1.5 @ 40 mL/hr with 30 mL Prostat once/day and 100 mL free water TID.  - This regimen is providing 1540 kcal, 80 grams of protein, and 1033 mL free water.  - Pt continues without BM since admission.   Patient is currently intubated on ventilator support MV:  11.2L/min Temp (24hrs), Avg:99.2 F (37.3 C), Max:99.9 F (37.7 C)  ADDENDUM: Pt extubated <1 hour ago; TF held and OGT removed at that time. Estimated nutrition needs have been adjusted s/p extubation. RD will follow-up Monday.    Diet Order:  Diet NPO time specified  Skin:  Reviewed, no issues  Last BM:  PTA  Height:   Ht Readings from Last 1 Encounters:  01/01/16 _0  (1.6 m)    Weight:   Wt Readings from Last 1 Encounters:  01/12/16 148 lb 13 oz (67.5 kg)    Ideal Body Weight:  52.27 kg  BMI:  Body mass index is 26.36  kg/m.  Estimated Nutritional Needs:   Kcal:  1355  Protein:  79-99 grams (1.2-1.5 grams/kg)  Fluid:  1.3-1.5 L/day  EDUCATION NEEDS:   No education needs identified at this time    Jarome Matin, MS, RD, LDN Inpatient Clinical Dietitian Pager # [phone] After hours/weekend pager # [phone]

## 2016-01-12 NOTE — Progress Notes (Signed)
Hypoglycemic Event  CBG: 62  Treatment: D50  Symptoms: now  Follow-up CBG: Time: 15 min CBG Result: 161  Possible Reasons for Event: NPO  Comments/MD notified: CCM   Lorin PicketScott, [place] R

## 2016-01-12 NOTE — Progress Notes (Signed)
eLink Physician-Brief Progress Note Patient Name: Leslie Porter DOB: 11/29/1945 MRN: [id number]   Date of Service  01/12/2016  HPI/Events of Note  Hypotension with MAP in 50s. Propofol rate reduced  eICU Interventions  500cc NS bolus     Intervention Category Intermediate Interventions: Hypotension - evaluation and management  Emeril Stille 01/12/2016, 12:51 AM

## 2016-01-12 NOTE — Procedures (Signed)
Extubation Procedure Note  Patient Details:   Name: Leslie Porter DOB: 05/24/1945 MRN: [id number]   Airway Documentation:     Evaluation  O2 sats: stable throughout Complications: No apparent complications Patient did tolerate procedure well. Bilateral Breath Sounds: Coarse crackles, Diminished   Yes  Dairl PonderWalters, Nakeda Lebron Nannette 01/12/2016, 12:11 PM  -Positive cuff leak.  Placed on 3 lpm . RN aware

## 2016-01-12 NOTE — Progress Notes (Signed)
PULMONARY / CRITICAL CARE MEDICINE   Name: Leslie Porter MRN: [id number] DOB: [birth year]    ADMISSION DATE:  12/31/2015 CONSULTATION DATE:  01/01/16  REFERRING MD:  Dr. Ardyth Harps / [facility]  PCP: Dr. Antony Haste   CHIEF COMPLAINT:   SOB  BRIEF: 70 y/o female with advanced COPD still smoking who was admitted for acute respiratory failure with hypoxemia due to severe CAP and a COPD exacerbation. Required intubation 9/9.   SUBJECTIVE:  RN reports pt required levophed overnight.  Given 500 ml NS bolus without significant change in BP.  Remains on propofol / fentanyl gtt.   Levo turned off this am with sedation reduction.    VITAL SIGNS: BP (!) 132/52   Pulse (!) 115   Temp 99.1 F (37.3 C) (Oral)   Resp 16   Ht 5\' 3"  (1.6 m)   Wt 148 lb 13 oz (67.5 kg)   SpO2 94%   BMI 26.36 kg/m   HEMODYNAMICS:    VENTILATOR SETTINGS: Vent Mode: CPAP;PSV FiO2 (%):  [30 %-35 %] 30 % Set Rate:  [15 bmp] 15 bmp Vt Set:  [420 mL] 420 mL PEEP:  [5 cmH20] 5 cmH20 Pressure Support:  [5 cmH20] 5 cmH20 Plateau Pressure:  [9 cmH20-14 cmH20] 14 cmH20  INTAKE / OUTPUT: I/O last 3 completed shifts: In: 2121.6 [I.V.:1379.1; Other:40; NG/GT:702.5] Out: 1325 [Urine:1325]  Physical Exam: Temp:  [96.6 F (35.9 C)-99.1 F (37.3 C)] 99.1 F (37.3 C) (09/19 0405) Resp:  [12-24] 16 (09/19 0830) BP: (77-171)/(31-77) 132/52 (09/19 0830) SpO2:  [90 %-98 %] 94 % (09/19 0830) FiO2 (%):  [30 %-35 %] 30 % (09/19 0815) Weight:  [148 lb 13 oz (67.5 kg)] 148 lb 13 oz (67.5 kg) (09/19 0621)  Gen: chronically ill appearing female, on vent, appears uncomfortable but more calm on exam this am  HENT: no jvd, ETT in place, mm pink/moist Neuro: RASS -1 to +1 , makes eye contact, moves extremities, follows commands  PULM: non-labored, good air movement bilaterally, clear breath sounds  CV: RRR, no MRG GI: BS+, soft, nontender MSK: normal bulk and tone Extremities:  No edema, small abrasion on nose from bipap  mask  LABS:  BMET  Recent Labs Lab 01/10/16 0420 01/11/16 0423 01/12/16 0551  NA 138 140 139  K 3.6 3.7 3.8  CL 101 103 102  CO2 30 32 32  BUN 31* 27* 33*  CREATININE 0.42* 0.40* 0.38*  GLUCOSE 198* 185* 154*    Electrolytes  Recent Labs Lab 01/07/16 0500  01/10/16 0420 01/11/16 0423 01/12/16 0551  CALCIUM  --   < > 8.1* 8.7* 8.3*  MG 2.1  --   --   --  1.8  PHOS 3.0  --   --   --  3.5  < > = values in this interval not displayed.  CBC  Recent Labs Lab 01/10/16 0420 01/11/16 0423 01/12/16 0551  WBC 13.4* 15.9* 14.7*  HGB 12.5 12.7 11.6*  HCT 38.7 38.9 35.8*  PLT 282 300 292    Coag's No results for input(s): APTT, INR in the last 168 hours.  Sepsis Markers  Recent Labs Lab 01/12/16 0551  LATICACIDVEN 0.8    ABG  Recent Labs Lab 01/07/16 0404 01/09/16 0150 01/09/16 1710  PHART 7.420 7.442 7.364  PCO2ART 53.5* 47.5 56.5*  PO2ART 74.4* 57.8* 75.1*    Liver Enzymes No results for input(s): AST, ALT, ALKPHOS, BILITOT, ALBUMIN in the last 168 hours.  Cardiac Enzymes No results for input(s):  TROPONINI, PROBNP in the last 168 hours.  Glucose  Recent Labs Lab 01/11/16 1540 01/11/16 1935 01/11/16 2314 01/12/16 0404 01/12/16 0601 01/12/16 0755  GLUCAP 158* 146* 110* 157* 161* 97    Imaging No results found.   STUDIES:  CTA Chest 9/7 >> neg for PE, segmental consolidative changes in the RML, no definite obstructing mass, emphysema ECHO 9/9 > LVEF 65-70%, grade 1 diastolic dysfunction   CULTURES:  Strep Antigen 9/7 >> neg U. Legionella 9/7 >> neg BCx2 9/7 >> not completed  HIV 9/8 >> neg  Resp 9/17 >>  ANTIBIOTICS: Azithro 9/7 >> 9/13 Rocephin 9/7 >> 9/13  SIGNIFICANT EVENTS: 9/07  Admit with respiratory distress, RML consolidation  9/15  More calm/alert, trial weaning 9/15  extuabted, bipap 9/16  Re-intubated   LINES/TUBES: ETT 9/7 >> 9/15, 9/16 >> RUE PICC 9/12 >>   DISCUSSION: 70 y/o F, smoker, with PMH of  [facility] of [facility].  CTA neg for PE, found to have RML consolidation causing severe CAP, complicated by possible COPD exacerbation. Failed extubation after 24h & reintubated 9/16, would like another trial of extubation if she reaches that point with weaning.     ASSESSMENT / PLAN:  PULMONARY A: Acute Hypercarbic Respiratory Failure - in setting of CAP, CXR improved 9/11 RML CAP  COPD with exacerbation - spirometry 06/24/15 with severe airway obstruction & low vital capacity  Tobacco Abuse  P:   Reduce Solumedrol to 20 mg q12 Nebulized pulmicort / brovana  Duoneb Q6 + Q2 PRN Chest PT with bed percussion Nicotine Patch Intermittent CXR  Family very helpful, would like to be here to help calm patient when she reaches point of SBT / extubation  BID WUA / SBT  CARDIOVASCULAR A:  Hypotension - 9/19, suspect sedation related with propofol / fentanyl gtt's & addition of oral agents.  Resolved with reduction of sedation.  Tachycardia  Hypertension - suspect related to agitation, no Hx Limited IV Access P: PRN metoprolol for HTN / tachycardia ICU monitoring of hemodynamics  Minimize sedation as able   RENAL A:   No acute issues P:   Monitor BMET and UOP Replace electrolytes as needed  GASTROINTESTINAL A:   At Risk Protein Calorie Malnutrition  P:   TF per Nutrition Famotidine for stress ulcer prophylaxis   HEMATOLOGIC A:   Anemia  Macrocytosis P:  Transfuse to maintain Hgb > 7 Trend CBC  Lovenox for DVT prophylaxis  Add folate PT  INFECTIOUS A:   RML CAP - improved 9/11, abx d/c'd 9/13 LUE IV Infiltration - mild swelling 9/10, resolved P:   Observe off abx  Follow respiratory culture  ENDOCRINE A:  Hyperglycemia - in setting of steroid administration P:   Daily BMP for glucose monitoring  NEUROLOGIC A:   Acute Metabolic Encephalopathy - in the setting of hypercarbia   Anxiety  P: Fentanyl gtt  Did not tolerate propofol  due to increased triglycerides in past > monitor closely  Continue PT seroquel / klonopin    FAMILY  - Updates:  Sister / brother only family. Updated at bedside 9/19.  Family in agreement that she would not want prolonged support / trach.  They are going to discuss the possibility of reintubation if she fails a second time.  Goal for BID weaning and optimize.    - Inter-disciplinary family meet or Palliative Care meeting due by: ongoing.   Canary BrimBrandi Ollis, NP-C [facility] Pgr: [phone]  or if no answer [phone] 01/12/2016, 9:02 AM  Off pressors once sedation off.  Anxious, but able to get good Vt with pressure support.  Decreased BS, no wheeze.  HR regular, tachycardic.  Abd soft.  No edema.  WBC 14.7, Hb 11.6, Creatinine 0.38.  Assessment/plan:  Acute on chronic respiratory failure 2nd to AECOPD and PNA. - explained to family that her respiratory status is about as good as it can get - they do not want prolonged vent support or trach - proceed with extubation - supplemental oxygen, prn Bipap after extubation - if she fails extubation, then no reintubation and transition to comfort care >> DNR order placed - continue solumedrol, duoneb  Anxiety. - add precedex if needed after extubation  CC time by me independent of APP time 32 minutes.  Coralyn Helling, MD [facility]/Critical Care 01/12/2016, 12:55 PM Pager:  [phone] After 3pm call: [phone]

## 2016-01-13 ENCOUNTER — Inpatient Hospital Stay (HOSPITAL_COMMUNITY): Payer: Medicare Other

## 2016-01-13 LAB — GLUCOSE, CAPILLARY
Glucose-Capillary: 128 mg/dL — ABNORMAL HIGH (ref 65–99)
Glucose-Capillary: 89 mg/dL (ref 65–99)
Glucose-Capillary: 122 mg/dL — ABNORMAL HIGH (ref 65–99)
Glucose-Capillary: 76 mg/dL (ref 65–99)
Glucose-Capillary: 81 mg/dL (ref 65–99)

## 2016-01-13 LAB — CULTURE, RESPIRATORY: CULTURE: NORMAL

## 2016-01-13 LAB — CULTURE, RESPIRATORY W GRAM STAIN

## 2016-01-13 MED ORDER — QUETIAPINE FUMARATE 50 MG PO TABS
50.0000 mg | ORAL_TABLET | Freq: Two times a day (BID) | ORAL | Status: DC
Start: 2016-01-13 — End: 2016-01-15
  Administered 2016-01-13 – 2016-01-15 (×4): 50 mg via ORAL
  Filled 2016-01-13 (×5): qty 1

## 2016-01-13 MED ORDER — STARCH (THICKENING) PO POWD
ORAL | Status: DC | PRN
  Filled 2016-01-13: qty 227

## 2016-01-13 MED ORDER — MAGNESIUM SULFATE 2 GM/50ML IV SOLN
2.0000 g | Freq: Once | INTRAVENOUS | Status: AC
  Administered 2016-01-13: 2 g via INTRAVENOUS
  Filled 2016-01-13: qty 50

## 2016-01-13 NOTE — Progress Notes (Signed)
Date:  January 13, 2016 Chart reviewed for concurrent status and case management needs. Will continue to follow the patient for changes and needs:  Extubated on [id number]/increased wob, high risk for reintubation need. Discharge Planning: following for needs Marcelle SmilingRhonda Davis, BSN, [place]RN3, [place]CCM   [phone]

## 2016-01-13 NOTE — Progress Notes (Signed)
Nutrition Follow-up  DOCUMENTATION CODES:   Not applicable  INTERVENTION:  - Diet advancement per SLP recommendations. - RD will follow-up 9/22.  NUTRITION DIAGNOSIS:   Inadequate oral intake related to inability to eat as evidenced by NPO status. -ongoing  GOAL:   Patient will meet greater than or equal to 90% of their needs -unmet with extubation and TF d/c.  MONITOR:   Diet advancement, Weight trends, Labs, I & O's  ASSESSMENT:   70 y/o female with advanced COPD still smoking who was admitted for acute respiratory failure with hypoxemia due to severe CAP and a COPD exacerbation. Required intubation 9/9.  9/20 Pt extubated shortly after noon yesterday and OGT removed at that time; TF d/c'ed. Pt remains NPO and per CCM NP note this AM, plan for SLP evaluation. Estimated nutrition needs updated based on extubation and calculated with weight from admission (65.9 kg) as weight trending up during hospitalization. Weight now beginning to trend back down and is currently +1.6 kg from admission weight.  RD will follow-up 9/22 and provide interventions based on diet at that time and pt needs.  Medications reviewed; 1 mg oral folic acid/day, sliding scale Novolog, 20 mg IV Solu-medrol BID, PRN intramuscular  Zofran. Labs reviewed; CBGs: 89 and 128 mg/dL this AM, BUN: 33 mg/dL, creatinine: 1.61 mg/dL, Ca: 8.3 mg/dL.    9/19 - Pt with OGT with order for Vital High Protein @ 25 mL/hr with 30 mL Prostat BID.  - This regimen provides 800 kcal, 83 grams of protein, and 502 mL free water.  - At time of RD visit pt receiving Vital High Protein @ 40 mL/hr which is providing 960 kcal, 84 grams of protein, and 803 mL free water.  - Free water flush per MD order: 20 mL every 4 hours which is providing 120 mL/day.  - Estimated kcal need updated this AM and continue to use 65.9 kg to estimate needs.  - Weight now beginning to trend down toward admission weight.  - Pt able to nod/shake head and  indicates that she is not having abdominal pain or nausea.  - Will adjust TF regimen based on re-estimated kcal need and current Propofol rate.   Patient is currently intubated on ventilator support MV: 7.6 L/min Temp (24hrs), Avg:98 F (36.7 C), Min:96.6 F (35.9 C), Max:99.1 F (37.3 C) Propofol: 6.2 ml/hr (164 kcal).  Drips: Fentanyl @ 75 mcg/hr, Propofol @ 15 mcg/kg/min.    9/18 - Pt was placed on BiPAP 9/15 PM for increased work of breathing and subsequently re-intubated 9/16 PM for further respiratory decline.  - OGT replaced at that time and pt currently receiving TF per previous order: Vital 1.5 @ 40 mL/hr with 30 mL Prostat once/day which is providing 1540 kcal, 80 grams of protein, and 733 mL free water.  - Also receiving 20 mL free water every 4 hours which is providing 120 mL free water/day.  - Re-estimated nutrition needs based on weight from 01/04/16 (65.9 kg) as that weight is consistent with admission weight and weight trending up over the past 1 week. - Will adjust TF regimen d/t current kcal provided by Propofol.    Patient is currently intubated on ventilator support MV: 6.5L/min Temp (24hrs), Avg:98.6 F (37 C), Max:99.1 F (37.3 C) Propofol: 19.52ml/hr (523 kcal).  Drips:Fentanyl @ 200 mcg/hr, Propofol @ 50 mcg/kg/min.    Diet Order:  Diet NPO time specified  Skin:  Reviewed, no issues  Last BM:  PTA?  Height:  Ht Readings from Last 1 Encounters:  01/01/16 5\' 3"  (1.6 m)    Weight:   Wt Readings from Last 1 Encounters:  01/12/16 148 lb 13 oz (67.5 kg)    Ideal Body Weight:  52.27 kg  BMI:  Body mass index is 26.36 kg/m.  Estimated Nutritional Needs:   Kcal:  1400-1600  Protein:  65-75 grams  Fluid:  1.4-1.6 L/day  EDUCATION NEEDS:   No education needs identified at this time    Trenton GammonJessica Suella Cogar, MS, RD, LDN Inpatient Clinical Dietitian Pager # [phone] After hours/weekend pager # [phone]

## 2016-01-13 NOTE — Progress Notes (Signed)
PULMONARY / CRITICAL CARE MEDICINE   Name: Leslie Porter MRN: [id number] DOB: 06/27/1945    ADMISSION DATE:  12/31/2015 CONSULTATION DATE:  01/01/16  REFERRING MD:  Dr. Ardyth HarpsHernandez / [facility]  PCP: Dr. Antony HasteMichael Badger   CHIEF COMPLAINT:   SOB  BRIEF: 70 y/o female with advanced COPD still smoking who was admitted for acute respiratory failure with hypoxemia due to severe CAP and a COPD exacerbation. Required intubation 9/9.   SUBJECTIVE:   Hypoglycemic event overnight, given d50w.  Pt extubated 9/19, tolerated well.   VITAL SIGNS: BP (!) 158/60   Pulse (!) 115   Temp 98.4 F (36.9 C) (Oral)   Resp (!) 21   Ht 5\' 3"  (1.6 m)   Wt 148 lb 13 oz (67.5 kg)   SpO2 95%   BMI 26.36 kg/m   HEMODYNAMICS:    VENTILATOR SETTINGS: Pressure Support:  [10 cmH20] 10 cmH20  INTAKE / OUTPUT: I/O last 3 completed shifts: In: 1187 [I.V.:667; Other:40; NG/GT:480] Out: 1875 [Urine:1875]  Physical Exam: Temp:  [98.1 F (36.7 C)-99 F (37.2 C)] 98.4 F (36.9 C) (09/20 0401) Resp:  [12-30] 21 (09/20 0600) BP: (101-172)/(44-94) 158/60 (09/20 0600) SpO2:  [73 %-100 %] 95 % (09/20 0812)  Gen: chronically ill appearing female in NAD  HENT: no jvd,mm pink/moist Neuro: awake, alert, follows commands, speech clear  PULM: non-labored, good air movement bilaterally, clear breath sounds  CV: RRR, no MRG GI: BS+, soft, nontender MSK: normal bulk and tone Extremities:  No edema, small abrasion on nose from bipap mask  LABS:  BMET  Recent Labs Lab 01/10/16 0420 01/11/16 0423 01/12/16 0551  NA 138 140 139  K 3.6 3.7 3.8  CL 101 103 102  CO2 30 32 32  BUN 31* 27* 33*  CREATININE 0.42* 0.40* 0.38*  GLUCOSE 198* 185* 154*    Electrolytes  Recent Labs Lab 01/07/16 0500  01/10/16 0420 01/11/16 0423 01/12/16 0551  CALCIUM  --   < > 8.1* 8.7* 8.3*  MG 2.1  --   --   --  1.8  PHOS 3.0  --   --   --  3.5  < > = values in this interval not displayed.  CBC  Recent Labs Lab  01/10/16 0420 01/11/16 0423 01/12/16 0551  WBC 13.4* 15.9* 14.7*  HGB 12.5 12.7 11.6*  HCT 38.7 38.9 35.8*  PLT 282 300 292    Coag's No results for input(s): APTT, INR in the last 168 hours.  Sepsis Markers  Recent Labs Lab 01/12/16 0551  LATICACIDVEN 0.8    ABG  Recent Labs Lab 01/07/16 0404 01/09/16 0150 01/09/16 1710  PHART 7.420 7.442 7.364  PCO2ART 53.5* 47.5 56.5*  PO2ART 74.4* 57.8* 75.1*    Liver Enzymes No results for input(s): AST, ALT, ALKPHOS, BILITOT, ALBUMIN in the last 168 hours.  Cardiac Enzymes No results for input(s): TROPONINI, PROBNP in the last 168 hours.  Glucose  Recent Labs Lab 01/12/16 1229 01/12/16 1619 01/12/16 1919 01/12/16 2027 01/12/16 2352 01/13/16 0400  GLUCAP 138* 129* 62* 161* 114* 128*    Imaging No results found.   STUDIES:  CTA Chest 9/7 >> neg for PE, segmental consolidative changes in the RML, no definite obstructing mass, emphysema ECHO 9/9 > LVEF 65-70%, grade 1 diastolic dysfunction   CULTURES:  Strep Antigen 9/7 >> neg U. Legionella 9/7 >> neg BCx2 9/7 >> not completed  HIV 9/8 >> neg  Resp 9/17 >>   ANTIBIOTICS: Azithro 9/7 >> 9/13  Rocephin 9/7 >> 9/13  SIGNIFICANT EVENTS: 9/07  Admit with respiratory distress, RML consolidation  9/15  More calm/alert, trial weaning 9/15  extuabted, bipap 9/16  Re-intubated   LINES/TUBES: ETT 9/7 >> 9/15, 9/16 >> RUE PICC 9/12 >>   DISCUSSION: 70 y/o F, smoker, with PMH of COPD admitted to [facility] on 9/7 with complaints of SOB.  CTA neg for PE, found to have RML consolidation causing severe CAP, complicated by possible COPD exacerbation. Failed extubation after 24h & reintubated 9/16, would like another trial of extubation if she reaches that point with weaning.     ASSESSMENT / PLAN:  PULMONARY A: Acute Hypercarbic Respiratory Failure - in setting of CAP, CXR improved 9/11 RML CAP  COPD with exacerbation - spirometry 06/24/15 with severe airway  obstruction & low vital capacity  Tobacco Abuse  P:   Solumedrol to 20 mg q12 Transition to prednisone once taking PO's Nebulized pulmicort / brovana  Duoneb Q6 + Q2 PRN Chest PT with bed percussion Nicotine Patch Intermittent CXR  Smoking cessation counseling  DNI Pt would not want prolonged support or trach.    CARDIOVASCULAR A:  Hypotension - 9/19, suspect sedation related with propofol / fentanyl gtt's & addition of oral agents.  Resolved with reduction of sedation.  Tachycardia  Hypertension - suspect related to agitation, no Hx Limited IV Access P: PRN metoprolol for HTN / tachycardia ICU monitoring of hemodynamics  DNR   RENAL A:   No acute issues P:   Monitor BMET and UOP Replace electrolytes as needed  GASTROINTESTINAL A:   At Risk Protein Calorie Malnutrition  P:   SLP evaluation post extubation  Famotidine for stress ulcer prophylaxis   HEMATOLOGIC A:   Anemia  Macrocytosis P:  Transfuse to maintain Hgb > 7 Trend CBC  Lovenox for DVT prophylaxis  Folate   INFECTIOUS A:   RML CAP - improved 9/11, abx d/c'd 9/13 LUE IV Infiltration - mild swelling 9/10, resolved P:   Observe off abx  Follow respiratory culture  ENDOCRINE A:  Hyperglycemia - in setting of steroid administration P:   Daily BMP for glucose monitoring  NEUROLOGIC A:   Acute Metabolic Encephalopathy - in the setting of hypercarbia, resolved.  Anxiety  P: D/C all IV sedation  Continue seroquel, reduce to 50 mg BID with slow taper to off  Klonopin low dose (0.25) for now, gradual taper to off    FAMILY  - Updates:  Sister / brother only family. Updated at bedside 9/19.  Family in agreement that she would not want prolonged support / trach.  They are going to discuss the possibility of reintubation if she fails a second time.  Goal for BID weaning and optimize.    - Inter-disciplinary family meet or Palliative Care meeting due by: ongoing.  Transfer to SDU status.  To [facility]  for primary SVC as of 9/21 am.     Canary Brim, NP-C [facility] Pgr: [phone] or if no answer [phone] 01/13/2016, 9:06 AM

## 2016-01-13 NOTE — Progress Notes (Signed)
eLink Physician-Brief Progress Note Patient Name: Leslie Porter DOB: 02/28/1946 MRN: [id number]   Date of Service  01/13/2016  HPI/Events of Note  Request for swallow evaluation recommended Thickit.   eICU Interventions  Will order Thickit.     Intervention Category Minor Interventions: Routine modifications to care plan (e.g. PRN medications for pain, fever)  Sommer,Steven Eugene 01/13/2016, 9:03 PM

## 2016-01-13 NOTE — Procedures (Signed)
Objective Swallowing Evaluation: Type of Study: MBS-Modified Barium Swallow Study  Patient Details  Name: Grace Bushyatricia Slyter MRN: [id number] Date of Birth: 04/11/1946  Today's Date: 01/13/2016 Time: SLP Start Time (ACUTE ONLY): 1230-SLP Stop Time (ACUTE ONLY): 1258 SLP Time Calculation (min) (ACUTE ONLY): 28 min  Past Medical History:  Past Medical History:  Diagnosis Date  . Asthma   . COPD (chronic obstructive pulmonary disease) (HCC)   . Osteoporosis    Past Surgical History:  Past Surgical History:  Procedure Laterality Date  . VAGINAL HYSTERECTOMY     HPI: 70 [facility] - required intubation- failed extubation trial and needed to be reintubated.  Pt intubated 9/7-9/15, reintubated 9/16-9/19.  Swallow evaluation ordered.    Subjective: pt awake in chair   Assessment / Plan / Recommendation  CHL IP CLINICAL IMPRESSIONS 01/13/2016  Therapy Diagnosis Mild oral phase dysphagia;Mild pharyngeal phase dysphagia;Other (Comment)  Clinical Impression Pt presents with mild oropharyngeal dysphagia with sensorimotor deficits likely from COPD/extended intubation.  Decreased oral coordination resulting in delayed transiting and premature spillage of into pharynx.    Pharyngeal swallow was delayed with some boluses spilling over epiglottis before swallow was triggered.  Pt with minimal laryngeal penetration of thin and CRACKER bolus without sensation.  Use of chin tuck posture effective but pt needed cues to conduct and will need full supervision for airway protection. Mild vallecular residuals present without pt awareness but further swallows decrease it.   Using live video, educated pt to findings and reinforced effective compensation strategies.  Recommend dys2/nectar and allowing pt ice chips with strict precautions including intermittent dry swallows and chin tuck with EVERY swallow.    Will follow clinically for indication for dietary advancement.  Pt may  benefit from repeat MBS due to SILENT nature of dysphagia.  Thanks for this consult.   Impact on safety and function Mild aspiration risk;Moderate aspiration risk      CHL IP TREATMENT RECOMMENDATION 01/13/2016  Treatment Recommendations Therapy as outlined in treatment plan below     Prognosis 01/13/2016  Prognosis for Safe Diet Advancement Good  Barriers to Reach Goals --  Barriers/Prognosis Comment --    CHL IP DIET RECOMMENDATION 01/13/2016  SLP Diet Recommendations Dysphagia 2 (Fine chop) solids;Nectar thick liquid;Ice chips PRN after oral care  Liquid Administration via Cup;Straw  Medication Administration Whole meds with puree  Compensations Minimize environmental distractions;Slow rate;Small sips/bites;Chin tuck;Follow solids with liquid  Postural Changes Seated upright at 90 degrees;Remain semi-upright after after feeds/meals (Comment)      CHL IP OTHER RECOMMENDATIONS 01/13/2016  Recommended Consults --  Oral Care Recommendations Oral care BID  Other Recommendations Order thickener from pharmacy;Have oral suction available      No flowsheet data found.    CHL IP FREQUENCY AND DURATION 01/13/2016  Speech Therapy Frequency (ACUTE ONLY) min 2x/week  Treatment Duration 2 weeks           CHL IP ORAL PHASE 01/13/2016  Oral Phase Impaired  Oral - Pudding Teaspoon --  Oral - Pudding Cup --  Oral - Honey Teaspoon --  Oral - Honey Cup --  Oral - Nectar Teaspoon Delayed oral transit  Oral - Nectar Cup Delayed oral transit  Oral - Nectar Straw Delayed oral transit  Oral - Thin Teaspoon Delayed oral transit;Decreased bolus cohesion  Oral - Thin Cup Decreased bolus cohesion;Delayed oral transit;Premature spillage  Oral - Thin Straw Delayed oral transit;Decreased bolus cohesion;Premature spillage  Oral - Puree Delayed oral transit;Decreased  bolus cohesion;Premature spillage;Weak lingual manipulation;Reduced posterior propulsion  Oral - Mech Soft --  Oral - Regular Weak lingual  manipulation;Impaired mastication;Delayed oral transit;Premature spillage  Oral - Multi-Consistency --  Oral - Pill --  Oral Phase - Comment --    CHL IP PHARYNGEAL PHASE 01/13/2016  Pharyngeal Phase Impaired  Pharyngeal- Pudding Teaspoon --  Pharyngeal --  Pharyngeal- Pudding Cup --  Pharyngeal --  Pharyngeal- Honey Teaspoon --  Pharyngeal --  Pharyngeal- Honey Cup --  Pharyngeal --  Pharyngeal- Nectar Teaspoon Delayed swallow initiation-vallecula;Reduced airway/laryngeal closure  Pharyngeal --  Pharyngeal- Nectar Cup Delayed swallow initiation-vallecula;Reduced airway/laryngeal closure  Pharyngeal --  Pharyngeal- Nectar Straw Delayed swallow initiation-vallecula;Reduced airway/laryngeal closure  Pharyngeal --  Pharyngeal- Thin Teaspoon Delayed swallow initiation-vallecula;Delayed swallow initiation-pyriform sinuses;Reduced airway/laryngeal closure  Pharyngeal --  Pharyngeal- Thin Cup Delayed swallow initiation-pyriform sinuses;Reduced airway/laryngeal closure  Pharyngeal --  Pharyngeal- Thin Straw Delayed swallow initiation-pyriform sinuses;Reduced airway/laryngeal closure;Penetration/Aspiration during swallow  Pharyngeal --  Pharyngeal- Puree Delayed swallow initiation-vallecula;Reduced airway/laryngeal closure;Pharyngeal residue - valleculae  Pharyngeal --  Pharyngeal- Mechanical Soft --  Pharyngeal --  Pharyngeal- Regular Penetration/Aspiration during swallow;Delayed swallow initiation-vallecula;Delayed swallow initiation-pyriform sinuses  Pharyngeal Material enters airway, CONTACTS cords and then ejected out  Pharyngeal- Multi-consistency --  Pharyngeal --  Pharyngeal- Pill --  Pharyngeal --  Pharyngeal Comment chin tuck posture with small bolus aids airway protection; pt needed cues to follow directions for adequate posturing     CHL IP CERVICAL ESOPHAGEAL PHASE 01/13/2016  Cervical Esophageal Phase Impaired  Pudding Teaspoon --  Pudding Cup --  Honey Teaspoon --   Honey Cup --  Nectar Teaspoon --  Nectar Cup --  Nectar Straw --  Thin Teaspoon --  Thin Cup --  Thin Straw --  Puree --  Mechanical Soft --  Regular --  Multi-consistency --  Pill --  Cervical Esophageal Comment appearance of minimally slow clearance at distal esophagus, further liquid boluses faciliated clearance    No flowsheet data found.  Mickie Bail [place], [place] [facility] SLP [phone]

## 2016-01-13 NOTE — Evaluation (Addendum)
Clinical/Bedside Swallow Evaluation Patient Details  Name: Leslie Porter MRN: [id number] Date of Birth: 05/19/1945  Today's Date: 01/13/2016 Time: SLP Start Time (ACUTE ONLY): 1030 SLP Stop Time (ACUTE ONLY): 1042 SLP Time Calculation (min) (ACUTE ONLY): 12 min  Past Medical History:  Past Medical History:  Diagnosis Date  . Asthma   . COPD (chronic obstructive pulmonary disease) (HCC)   . Osteoporosis    Past Surgical History:  Past Surgical History:  Procedure Laterality Date  . VAGINAL HYSTERECTOMY     HPI:  70 [facility] - required intubation- failed extubation trial and needed to be reintubated.  Pt intubated 9/7-9/15, reintubated 9/16-9/19.  Swallow evaluation ordered.     Assessment / Plan / Recommendation Clinical Impression  Baseline cough noted and cough x2 during intake that did not appear to be related to po intake.  No indications of airway compromise with intake observed *ice, thin, puree, cracker.   Pt RR stayed stable during all intake and no indications of residuals.  Can not rule out silent aspiration given length of intubation and poor phonation abilities.  Options include start diet with precautions or pursue MBS.      Aspiration Risk  Mild aspiration risk    Diet Recommendation NPO;Regular (npo & MBS vs po diet with strict precautions)   Liquid Administration via: Cup;Straw Medication Administration: Whole meds with puree Supervision: Patient able to self feed Compensations: Slow rate;Small sips/bites Postural Changes: Seated upright at 90 degrees;Remain upright for at least 30 minutes after po intake    Other  Recommendations Oral Care Recommendations: Oral care BID   Follow up Recommendations        Frequency and Duration min 2x/week  1 week       Prognosis Prognosis for Safe Diet Advancement: Good      Swallow Study   General Date of Onset: 01/13/16 HPI: 70 [facility]  - required intubation- failed extubation trial and needed to be reintubated.  Pt intubated 9/7-9/15, reintubated 9/16-9/19.  Swallow evaluation ordered.   Type of Study: Bedside Swallow Evaluation Diet Prior to this Study: NPO Temperature Spikes Noted: No Respiratory Status: Nasal cannula Length of Intubations (days): 10 days Date extubated: 01/12/16 (needed to be intubated twice) Behavior/Cognition: Alert;Cooperative;Pleasant mood Oral Cavity Assessment: Erythema Oral Care Completed by SLP: No Oral Cavity - Dentition: Adequate natural dentition Vision: Functional for self-feeding Self-Feeding Abilities: Able to feed self Patient Positioning: Upright in bed Baseline Vocal Quality: Hoarse;Low vocal intensity Volitional Cough: Strong Volitional Swallow: Able to elicit    Oral/Motor/Sensory Function Overall Oral Motor/Sensory Function: Within functional limits   Ice Chips Ice chips: Within functional limits   Thin Liquid Thin Liquid: Within functional limits Presentation: Cup;Self Fed    Nectar Thick Nectar Thick Liquid: Not tested   Honey Thick Honey Thick Liquid: Not tested   Puree Puree: Within functional limits Presentation: Self Fed;Spoon   Solid   GO   Solid: Within functional limits Presentation: Self Fed        Mickie BailKimball, Jamareon Shimel Ann Ellie Bryand [place], [place] [facility] SLP [phone]    Addendum:1107 am  [facility] to [facility] and reviewed options, MD indicated need to proceed with MBS.  Will complete as ordered.

## 2016-01-14 ENCOUNTER — Encounter (HOSPITAL_COMMUNITY): Payer: Self-pay | Admitting: Primary Care

## 2016-01-14 DIAGNOSIS — Z515 Encounter for palliative care: Secondary | ICD-10-CM

## 2016-01-14 DIAGNOSIS — Z7189 Other specified counseling: Secondary | ICD-10-CM

## 2016-01-14 LAB — GLUCOSE, CAPILLARY
GLUCOSE-CAPILLARY: 93 mg/dL (ref 65–99)
GLUCOSE-CAPILLARY: 174 mg/dL — AB (ref 65–99)
GLUCOSE-CAPILLARY: 128 mg/dL — AB (ref 65–99)
Glucose-Capillary: 104 mg/dL — ABNORMAL HIGH (ref 65–99)
Glucose-Capillary: 96 mg/dL (ref 65–99)
Glucose-Capillary: 99 mg/dL (ref 65–99)

## 2016-01-14 LAB — CBC
HCT: 38.8 % (ref 36.0–46.0)
Hemoglobin: 12.8 g/dL (ref 12.0–15.0)
MCH: 33 pg (ref 26.0–34.0)
MCHC: 33 g/dL (ref 30.0–36.0)
MCV: 100 fL (ref 78.0–100.0)
PLATELETS: 337 10*3/uL (ref 150–400)
RBC: 3.88 MIL/uL (ref 3.87–5.11)
RDW: 12.7 % (ref 11.5–15.5)
WBC: 15.5 10*3/uL — ABNORMAL HIGH (ref 4.0–10.5)

## 2016-01-14 LAB — BASIC METABOLIC PANEL
Anion gap: 8 (ref 5–15)
BUN: 19 mg/dL (ref 6–20)
CO2: 27 mmol/L (ref 22–32)
Calcium: 7.7 mg/dL — ABNORMAL LOW (ref 8.9–10.3)
Chloride: 104 mmol/L (ref 101–111)
Creatinine, Ser: 0.34 mg/dL — ABNORMAL LOW (ref 0.44–1.00)
GFR calc Af Amer: >60 mL/min (ref >60)
GFR calc non Af Amer: >60 mL/min (ref >60)
Glucose, Bld: 111 mg/dL — ABNORMAL HIGH (ref 65–99)
Potassium: 3.3 mmol/L — ABNORMAL LOW (ref 3.5–5.1)
Sodium: 139 mmol/L (ref 135–145)

## 2016-01-14 MED ORDER — POTASSIUM CHLORIDE CRYS ER 20 MEQ PO TBCR
20.0000 meq | EXTENDED_RELEASE_TABLET | ORAL | Status: AC
  Administered 2016-01-14 (×2): 20 meq via ORAL
  Filled 2016-01-14 (×2): qty 1

## 2016-01-14 MED ORDER — PREDNISONE 20 MG PO TABS
40.0000 mg | ORAL_TABLET | Freq: Every day | ORAL | Status: DC
  Administered 2016-01-14 – 2016-01-15 (×2): 40 mg via ORAL
  Filled 2016-01-14 (×2): qty 2

## 2016-01-14 NOTE — Progress Notes (Signed)
Speech Language Pathology Treatment: Dysphagia  Patient Details Name: Leslie Porter MRN: [id number] DOB: 12/10/1945 Today's Date: 01/14/2016 Time: [id number] SLP Time Calculation (min) (ACUTE ONLY): 27 min  Assessment / Plan / Recommendation Clinical Impression  Today pt appears more fatigued than yesterday and was nodding off during session.  She reports displeasure with nectar thickened liquids - therefore SLP thickened cola and pt found this more palatable.  She reports understanding of need for modified diet at this time and advised this is temporary. Demonstrated to pt and Leslie Porter procedure of thickening liquids.    Pt with baseline cough noted and delayed cough with intake.  She did not aspirate on the MBS but is at risk due to her dis coordination with swallow and decreased epiglottic deflection/laryngeal elevation.   SLP showed pt and Leslie Porter video of MBS study - showing laryngeal penetration of cracker from vallecular space after the swallow to implore necessity of chin tuck. Pt is conducting chin tuck adequately with mod I.    Recommend continue dys2/nectar diet with strict aspiration precautions using caution with pt's fatigue today.  Hopeful for dietary advancement with repeat MBS prior to pt dc from hospital.  Repeat MBS indicated due to pt's silent nature of dysphagia.  Pt and [name] in agreement.     HPI HPI: 70 [facility] - required intubation- failed extubation trial and needed to be reintubated.  Pt intubated 9/7-9/15, reintubated 9/16-9/19.  Swallow evaluation ordered.        SLP Plan  Continue with current plan of care     Recommendations  Diet recommendations: Dysphagia 2 (fine chop);Nectar-thick liquid Liquids provided via: Straw;Cup Medication Administration: Whole meds with puree (if small, crushed if large) Supervision: Patient able to self feed;Intermittent supervision to cue for compensatory strategies Compensations: Slow  rate;Small sips/bites;Chin tuck;Use straw to facilitate chin tuck;Follow solids with liquid Postural Changes and/or Swallow Maneuvers: Seated upright 90 degrees;Upright 30-60 min after meal                Oral Care Recommendations: Oral care BID Plan: Continue with current plan of care       GO                Donavan Burnetamara Wacey Zieger, MS [facility] SLP [phone]

## 2016-01-14 NOTE — Progress Notes (Signed)
eLink Nursing ICU Electrolyte Replacement Protocol  Patient Name: Leslie Porter DOB: 04/30/1945 MRN: [id number]  Date of Service  01/14/2016   HPI/Events of Note    Recent Labs Lab 01/09/16 0500 01/10/16 0420 01/11/16 0423 01/12/16 0551 01/14/16 0405  NA 138 138 140 139 139  K 4.6 3.6 3.7 3.8 3.3*  CL 100* 101 103 102 104  CO2 34* 30 32 32 27  GLUCOSE 126* 198* 185* 154* 111*  BUN 25* 31* 27* 33* 19  CREATININE 0.40* 0.42* 0.40* 0.38* 0.34*  CALCIUM 8.4* 8.1* 8.7* 8.3* 7.7*  MG  --   --   --  1.8  --   PHOS  --   --   --  3.5  --     Estimated Creatinine Clearance: 59.5 mL/min (by C-G formula based on SCr of 0.34 mg/dL (L)).  Intake/Output      09/20 0701 - 09/21 0700   P.O. 0   I.V. (mL/kg) 546.2 (8.1)   NG/GT 0   IV Piggyback 50   Total Intake(mL/kg) 596.2 (8.8)   Urine (mL/kg/hr) 1300 (0.8)   Stool 0 (0)   Total Output 1300   Net -703.8       Stool Occurrence 1 x    - I/O DETAILED x24h    Total I/O In: 180 [I.V.:180] Out: 300 [Urine:300] - I/O THIS SHIFT    ASSESSMENT   eICURN Interventions  K+ 3.4 Replaced using ICU protocol   ASSESSMENT: MAJOR ELECTROLYTE    Merita NortonFrye, Patrice Moates Nicole 01/14/2016, 6:03 AM

## 2016-01-14 NOTE — Progress Notes (Signed)
PROGRESS NOTE    Leslie Porter  ONG:[id number] DOB: 04/03/1946 DOA: 12/31/2015 PCP: Chesley Noon, MD    Brief Narrative:  70 y/o female with advanced COPD still smoking who was admitted for acute respiratory failure with hypoxemia due to severe CAP and a COPD exacerbation. Required intubation 9/9, extubated on 9/19. And transferred to [facility] on 9/21  Assessment & Plan:   Principal Problem:   COPD with acute exacerbation ([place]) Active Problems:   Cigarette smoker   Acute hypoxemic respiratory failure ([place])   CAP (community acquired pneumonia)   Respiratory failure ([place])   Acute respiratory failure with hypoxia and hypercarbia from acute copd exacerbation from smoking and right middle lobe pneumonia: Intubated on 9/9 and extubated on 9/19.  Completed the course of antibiotics on 9/13.  Currently on tapering dose of prednisone.  Nocturnal bipap / cpap. Pt refusing.  Palliative care consulted for goals of care.  Bronchodilators and nasal oxygen as needed.    Acute metabolic encephalopathy from hypercarbia: resolved, she is at her baseline.    Leukocytosis:  Possibly from steroids.  She is afebrile and complete the course of antibiotics.    Hypokalemia: replete as needed. Repeat in am.   Deconditioned: Physical therapy, as per family and pt she wants to go home.  Lives at home alone.    DVT prophylaxis: (Lovenox/Heparin/SCD's/anticoagulated/None (if comfort care) Code Status: DNI Family Communication: sister at bedside.  Disposition Plan: pending pt EVAL.    Consultants:   Primary PCCM till 9/20  Procedures: 9/07  Admit with respiratory distress, RML consolidation  9/15  More calm/alert, trial weaning 9/15  extuabted, bipap 9/16  Re-intubated  ETT 9/7 >> 9/15, 9/16 >> RUE PICC 9/12 >>   STUDIES:  CTA Chest 9/7 >> neg for PE, segmental consolidative changes in the RML, no definite obstructing mass, emphysema ECHO 9/9 > LVEF 44-01%, grade 1 diastolic  dysfunction   CULTURES:  Strep Antigen 9/7 >> neg U. Legionella 9/7 >> neg BCx2 9/7 >> not completed  HIV 9/8 >> neg  Resp 9/17 >>  normal flora.     Antimicrobials: none , completed antibiotics on 9/13.    Subjective: No new complaints.   Objective: Vitals:   01/14/16 0635 01/14/16 0700 01/14/16 0821 01/14/16 0836  BP: (!) 156/67 (!) 153/52    Pulse:      Resp: (!) 21 (!) 21    Temp:    98 F (36.7 C)  TempSrc:    Axillary  SpO2: 100% 100% 99%   Weight:      Height:        Intake/Output Summary (Last 24 hours) at 01/14/16 1034 Last data filed at 01/14/16 0730  Gross per 24 hour  Intake              260 ml  Output             1050 ml  Net             -790 ml   Filed Weights   01/07/16 0500 01/08/16 0401 01/12/16 0621  Weight: 68.6 kg (151 lb 3.8 oz) 68.9 kg (151 lb 14.4 oz) 67.5 kg (148 lb 13 oz)    Examination:  General exam: Appears calm and comfortable on 2 lit of [place] oxygen.  Respiratory system: diminished at bases, wheezing has resolved.  Cardiovascular system: S1 & S2 heard, RRR. No JVD, murmurs, rubs, gallops or clicks. No pedal edema. Gastrointestinal system: Abdomen is nondistended, soft and nontender. No organomegaly  or masses felt. Normal bowel sounds heard. Central nervous system: Alert and oriented. No focal neurological deficits. Skin: No rashes, lesions or ulcers  Data Reviewed: I have personally reviewed following labs and imaging studies  CBC:  Recent Labs Lab 01/09/16 0500 01/10/16 0420 01/11/16 0423 01/12/16 0551 01/14/16 0405  WBC 16.3* 13.4* 15.9* 14.7* 15.5*  HGB 13.6 12.5 12.7 11.6* 12.8  HCT 41.4 38.7 38.9 35.8* 38.8  MCV 100.7* 102.7* 100.8* 102.6* 100.0  PLT 308 282 300 292 935   Basic Metabolic Panel:  Recent Labs Lab 01/09/16 0500 01/10/16 0420 01/11/16 0423 01/12/16 0551 01/14/16 0405  NA 138 138 140 139 139  K 4.6 3.6 3.7 3.8 3.3*  CL 100* 101 103 102 104  CO2 34* 30 32 32 27  GLUCOSE 126* 198* 185*  154* 111*  BUN 25* 31* 27* 33* 19  CREATININE 0.40* 0.42* 0.40* 0.38* 0.34*  CALCIUM 8.4* 8.1* 8.7* 8.3* 7.7*  MG  --   --   --  1.8  --   PHOS  --   --   --  3.5  --    GFR: Estimated Creatinine Clearance: 59.5 mL/min (by C-G formula based on SCr of 0.34 mg/dL (L)). Liver Function Tests: No results for input(s): AST, ALT, ALKPHOS, BILITOT, PROT, ALBUMIN in the last 168 hours. No results for input(s): LIPASE, AMYLASE in the last 168 hours. No results for input(s): AMMONIA in the last 168 hours. Coagulation Profile: No results for input(s): INR, PROTIME in the last 168 hours. Cardiac Enzymes:  Recent Labs Lab 01/12/16 0551  CKTOTAL 20*  CKMB 2.0   BNP (last 3 results) No results for input(s): PROBNP in the last 8760 hours. HbA1C: No results for input(s): HGBA1C in the last 72 hours. CBG:  Recent Labs Lab 01/13/16 2000 01/13/16 2149 01/14/16 0023 01/14/16 0407 01/14/16 0837  GLUCAP 76 81 99 104* 93   Lipid Profile:  Recent Labs  01/12/16 0551  TRIG 118   Thyroid Function Tests: No results for input(s): TSH, T4TOTAL, FREET4, T3FREE, THYROIDAB in the last 72 hours. Anemia Panel: No results for input(s): VITAMINB12, FOLATE, FERRITIN, TIBC, IRON, RETICCTPCT in the last 72 hours. Sepsis Labs:  Recent Labs Lab 01/12/16 0551  LATICACIDVEN 0.8    Recent Results (from the past 240 hour(s))  Culture, respiratory (NON-Expectorated)     Status: None   Collection Time: 01/10/16 10:52 AM  Result Value Ref Range Status   Specimen Description TRACHEAL ASPIRATE  Final   Special Requests NONE  Final   Gram Stain   Final    MODERATE WBC PRESENT,BOTH PMN AND MONONUCLEAR RARE SQUAMOUS EPITHELIAL CELLS PRESENT NO ORGANISMS SEEN    Culture   Final    Consistent with normal respiratory flora. Performed at [facility]    Report Status 01/13/2016 FINAL  Final         Radiology Studies: Dg Swallowing Func-speech Pathology  Result Date: 01/13/2016 Objective  Swallowing Evaluation: Type of Study: MBS-Modified Barium Swallow Study Patient Details Name: Leslie Porter MRN: [id number] Date of Birth: 19-Sep-1945 Today's Date: 01/13/2016 Time: SLP Start Time (ACUTE ONLY): 1230-SLP Stop Time (ACUTE ONLY): 1258 SLP Time Calculation (min) (ACUTE ONLY): 28 min Past Medical History: Past Medical History: Diagnosis Date . Asthma  . COPD (chronic obstructive pulmonary disease) ([place])  . Osteoporosis  Past Surgical History: Past Surgical History: Procedure Laterality Date . VAGINAL HYSTERECTOMY   HPI: 70 yo female adm to [facility] with respiratory failure - required intubation- failed extubation  trial and needed to be reintubated.  Pt intubated 9/7-9/15, reintubated 9/16-9/19.  Swallow evaluation ordered.   Subjective: pt awake in chair Assessment / Plan / Recommendation CHL IP CLINICAL IMPRESSIONS 01/13/2016 Therapy Diagnosis Mild oral phase dysphagia;Mild pharyngeal phase dysphagia;Other (Comment) Clinical Impression  Pt presents with mild oropharyngeal dysphagia with sensorimotor deficits likely from COPD/extended intubation.  Decreased oral coordination resulting in delayed transiting and premature spillage of into pharynx.  Pharyngeal swallow was delayed with some boluses spilling over epiglottis before swallow was triggered.  Pt with minimal laryngeal penetration of thin and CRACKER bolus without sensation.  Use of chin tuck posture effective but pt needed cues to conduct and will need full supervision for airway protection. Mild vallecular residuals present without pt awareness but further swallows decrease it. Using live video, educated pt to findings and reinforced effective compensation strategies.  Recommend dys2/nectar and allowing pt ice chips with strict precautions including intermittent dry swallows and chin tuck with EVERY swallow.  Will follow clinically for indication for dietary advancement.  Pt may benefit from repeat MBS due to SILENT nature of dysphagia.  Thanks for this  consult.    Impact on safety and function Mild aspiration risk;Moderate aspiration risk   CHL IP TREATMENT RECOMMENDATION 01/13/2016 Treatment Recommendations Therapy as outlined in treatment plan below   Prognosis 01/13/2016 Prognosis for Safe Diet Advancement Good Barriers to Reach Goals -- Barriers/Prognosis Comment -- CHL IP DIET RECOMMENDATION 01/13/2016 SLP Diet Recommendations Dysphagia 2 (Fine chop) solids;Nectar thick liquid;Ice chips PRN after oral care Liquid Administration via Cup;Straw Medication Administration Whole meds with puree Compensations Minimize environmental distractions;Slow rate;Small sips/bites;Chin tuck;Follow solids with liquid Postural Changes Seated upright at 90 degrees;Remain semi-upright after after feeds/meals (Comment)   CHL IP OTHER RECOMMENDATIONS 01/13/2016 Recommended Consults -- Oral Care Recommendations Oral care BID Other Recommendations Order thickener from pharmacy;Have oral suction available   No flowsheet data found.  CHL IP FREQUENCY AND DURATION 01/13/2016 Speech Therapy Frequency (ACUTE ONLY) min 2x/week Treatment Duration 2 weeks      CHL IP ORAL PHASE 01/13/2016 Oral Phase Impaired Oral - Pudding Teaspoon -- Oral - Pudding Cup -- Oral - Honey Teaspoon -- Oral - Honey Cup -- Oral - Nectar Teaspoon Delayed oral transit Oral - Nectar Cup Delayed oral transit Oral - Nectar Straw Delayed oral transit Oral - Thin Teaspoon Delayed oral transit;Decreased bolus cohesion Oral - Thin Cup Decreased bolus cohesion;Delayed oral transit;Premature spillage Oral - Thin Straw Delayed oral transit;Decreased bolus cohesion;Premature spillage Oral - Puree Delayed oral transit;Decreased bolus cohesion;Premature spillage;Weak lingual manipulation;Reduced posterior propulsion Oral - Mech Soft -- Oral - Regular Weak lingual manipulation;Impaired mastication;Delayed oral transit;Premature spillage Oral - Multi-Consistency -- Oral - Pill -- Oral Phase - Comment --  CHL IP PHARYNGEAL PHASE  01/13/2016 Pharyngeal Phase Impaired Pharyngeal- Pudding Teaspoon -- Pharyngeal -- Pharyngeal- Pudding Cup -- Pharyngeal -- Pharyngeal- Honey Teaspoon -- Pharyngeal -- Pharyngeal- Honey Cup -- Pharyngeal -- Pharyngeal- Nectar Teaspoon Delayed swallow initiation-vallecula;Reduced airway/laryngeal closure Pharyngeal -- Pharyngeal- Nectar Cup Delayed swallow initiation-vallecula;Reduced airway/laryngeal closure Pharyngeal -- Pharyngeal- Nectar Straw Delayed swallow initiation-vallecula;Reduced airway/laryngeal closure Pharyngeal -- Pharyngeal- Thin Teaspoon Delayed swallow initiation-vallecula;Delayed swallow initiation-pyriform sinuses;Reduced airway/laryngeal closure Pharyngeal -- Pharyngeal- Thin Cup Delayed swallow initiation-pyriform sinuses;Reduced airway/laryngeal closure Pharyngeal -- Pharyngeal- Thin Straw Delayed swallow initiation-pyriform sinuses;Reduced airway/laryngeal closure;Penetration/Aspiration during swallow Pharyngeal -- Pharyngeal- Puree Delayed swallow initiation-vallecula;Reduced airway/laryngeal closure;Pharyngeal residue - valleculae Pharyngeal -- Pharyngeal- Mechanical Soft -- Pharyngeal -- Pharyngeal- Regular Penetration/Aspiration during swallow;Delayed swallow initiation-vallecula;Delayed swallow initiation-pyriform sinuses Pharyngeal Material enters airway,  CONTACTS cords and then ejected out Pharyngeal- Multi-consistency -- Pharyngeal -- Pharyngeal- Pill -- Pharyngeal -- Pharyngeal Comment chin tuck posture with small bolus aids airway protection; pt needed cues to follow directions for adequate posturing  CHL IP CERVICAL ESOPHAGEAL PHASE 01/13/2016 Cervical Esophageal Phase Impaired Pudding Teaspoon -- Pudding Cup -- Honey Teaspoon -- Honey Cup -- Nectar Teaspoon -- Nectar Cup -- Nectar Straw -- Thin Teaspoon -- Thin Cup -- Thin Straw -- Puree -- Mechanical Soft -- Regular -- Multi-consistency -- Pill -- Cervical Esophageal Comment appearance of minimally slow clearance at distal  esophagus, further liquid boluses faciliated clearance No flowsheet data found. Macario Golds 01/13/2016, 2:12 PM                   Scheduled Meds: . budesonide (PULMICORT) nebulizer solution  0.5 mg Nebulization BID  . calcium carbonate (dosed in mg elemental calcium)  500 mg of elemental calcium Per Tube Q breakfast  . chlorhexidine gluconate (MEDLINE KIT)  15 mL Mouth Rinse BID  . clonazePAM  0.25 mg Oral BID  . enoxaparin (LOVENOX) injection  40 mg Subcutaneous Q24H  . folic acid  1 mg Oral Daily  . insulin aspart  0-9 Units Subcutaneous Q4H  . ipratropium-albuterol  3 mL Nebulization Q6H  . mouth rinse  15 mL Mouth Rinse QID  . nicotine  21 mg Transdermal Daily  . predniSONE  40 mg Oral QAC breakfast  . QUEtiapine  50 mg Oral BID  . ranitidine  150 mg Per Tube BID  . sodium chloride flush  10-40 mL Intracatheter Q12H   Continuous Infusions: . sodium chloride 10 mL/hr at 01/14/16 0700     LOS: 14 days    Time spent: 30 minutes.    Hosie Poisson, MD [facility] Pager [phone]   If 7PM-7AM, please contact night-coverage [email or web address] Password [facility] 01/14/2016, 10:34 AM

## 2016-01-14 NOTE — Consult Note (Signed)
Consultation Note Date: 01/14/2016   Patient Name: Leslie Porter  DOB: 1946/04/04  MRN: [id number]  Age / Sex: 70 y.o., female  PCP: Chesley Noon, MD Referring Physician: Hosie Poisson, MD  Reason for Consultation: Disposition, Establishing goals of care and Psychosocial/spiritual support  HPI/Patient Profile: 70 y.o. female  with past medical history of asthma, COPD continue smoking, osteoporosis, vaginal hysterectomy admitted on 12/31/2015 with acute hyper carbonate respiratory failure.   Clinical Assessment and Goals of Care: Ms. Eshleman is sitting in the [place] chair.  She makes eye contact and greets me as I enter.  Her sister Hyman Bible is at bedside at this time. Ms. Sollars cries for approximately 1 minute after I hand her my card. Peter Congo states that she has not seen this behavior before. Ms. Canlas is unable to say why she is crying. I share that it is normal for her to be emotional and even confused at times, as she has been very ill. I keep my visit relatively short.    We talk about healthcare power of attorney, see below. We also talk about durable power of attorney. Ms. Lurie states that her bank will help her complete durable power of attorney. We talk about advanced directives, and Peter Congo shares that she has paperwork stating Ms. Vonderhaar's wishes, and she can abide her wishes to allow a natural death. I complete the [place] form, and we talk about placing this form on her refrigerator. Ms. Abate asks about a "life alert" service. I share that I feel this would benefit her, and we will work with home health or palliative to assist her.   We talk about home health services versus palliative services. Ms. Covel states that she lives in [place] and her choices are limited. I ask case management to assist Ms. Brislin in finding in-home palliative services. Ms. Breece and Peter Congo  deny other issues or concerns today, I encourage them to call at any time.  Healthcare power of attorney NEXT OF KIN - Ms. Lahey states that she wants her sister, Hyman Bible, to make health care decisions if she is unable. There is no legal paperwork, but they are considering completing this on an outpatient basis. There are two brothers, one of which is involved in Ms. Danker's care, but he agrees that Peter Congo is the Media planner.   SUMMARY OF RECOMMENDATIONS   continue to treat the treatable at this point, return home with home health versus palliative services.  Code Status/Advance Care Planning:  DNR - [place] form completed. I encourage family to post the [place] form on the refrigerator when they return home.  Symptom Management:   per hospitalist  Palliative Prophylaxis:   Frequent Pain Assessment and Palliative Wound Care  Additional Recommendations (Limitations, Scope, Preferences):  Continue to treat the treatable, but no extraordinary measures such as CPR or intubation, no BiPAP.  Psycho-social/Spiritual:   Desire for further Chaplaincy support:no  Additional Recommendations: Caregiving  Support/Resources and Education on Hospice  Prognosis:   Unable to determine, based on  outcomes.  Discharge Planning: Return to home with home health versus home palliative services. Sister Peter Congo is willing and able to care for Ms. Schiefelbein weather and Mrs. Dorval's home or [name]'s home.      Primary Diagnoses: Present on Admission: . Cigarette smoker . Acute hypoxemic respiratory failure ([place]) . COPD with acute exacerbation ([place]) . CAP (community acquired pneumonia) . Respiratory failure ([place])   I have reviewed the medical record, interviewed the patient and family, and examined the patient. The following aspects are pertinent.  Past Medical History:  Diagnosis Date  . Asthma   . COPD (chronic obstructive pulmonary disease) ([place])   . Osteoporosis     Social History   Social History  . Marital status: Divorced    Spouse name: N/A  . Number of children: N/A  . Years of education: N/A   Occupational History  . merchandising     [employer]   Social History Main Topics  . Smoking status: Current Every Day Smoker    Packs/day: 0.50    Years: 50.00    Types: Cigarettes  . Smokeless tobacco: Never Used  . Alcohol use 0.0 oz/week     Comment: Occasional  . Drug use: No  . Sexual activity: Not Asked   Other Topics Concern  . None   Social History Narrative  . None   Family History  Problem Relation Age of Onset  . Heart attack Father   . Heart disease Mother    Scheduled Meds: . budesonide (PULMICORT) nebulizer solution  0.5 mg Nebulization BID  . calcium carbonate (dosed in mg elemental calcium)  500 mg of elemental calcium Per Tube Q breakfast  . chlorhexidine gluconate (MEDLINE KIT)  15 mL Mouth Rinse BID  . clonazePAM  0.25 mg Oral BID  . enoxaparin (LOVENOX) injection  40 mg Subcutaneous Q24H  . folic acid  1 mg Oral Daily  . insulin aspart  0-9 Units Subcutaneous Q4H  . ipratropium-albuterol  3 mL Nebulization Q6H  . mouth rinse  15 mL Mouth Rinse QID  . nicotine  21 mg Transdermal Daily  . predniSONE  40 mg Oral QAC breakfast  . QUEtiapine  50 mg Oral BID  . ranitidine  150 mg Per Tube BID  . sodium chloride flush  10-40 mL Intracatheter Q12H   Continuous Infusions: . sodium chloride 10 mL/hr at 01/14/16 1300   PRN Meds:.acetaminophen, albuterol, docusate, food thickener, guaiFENesin, hydrALAZINE, ipratropium-albuterol, metoprolol, ondansetron, sodium chloride flush Medications Prior to Admission:  Prior to Admission medications   Medication Sig Start Date End Date Taking? Authorizing Provider  albuterol (PROVENTIL HFA;VENTOLIN HFA) 108 (90 Base) MCG/ACT inhaler Inhale 1-2 puffs into the lungs every 6 (six) hours as needed for wheezing or shortness of breath.   Yes Historical Provider, MD   alendronate (FOSAMAX) 70 MG tablet Take 70 mg by mouth every Sunday.  12/29/15  Yes Historical Provider, MD  budesonide-formoterol (SYMBICORT) 160-4.5 MCG/ACT inhaler Inhale 2 puffs into the lungs 2 (two) times daily.   Yes Historical Provider, MD  Calcium Carbonate (CALCIUM 600 PO) Take 1 tablet by mouth daily.   Yes Historical Provider, MD  ipratropium-albuterol (DUONEB) 0.5-2.5 (3) MG/3ML SOLN Take 3 mLs by nebulization 2 (two) times daily.   Yes Historical Provider, MD  Multiple Vitamin (MULTIVITAMIN) capsule Take 1 capsule by mouth daily.   Yes Historical Provider, MD  predniSONE (DELTASONE) 20 MG tablet Take 1 tablet by mouth twice daily x4 days, then 1 tab once daily x4  days, then 1/2 tab daily until gone. Patient taking differently: Take 40 mg by mouth daily. 5 day course starting on 12/30/2015 07/23/15  Yes Noralee Space, MD  umeclidinium bromide (INCRUSE ELLIPTA) 62.5 MCG/INH AEPB Inhale 1 puff into the lungs daily. 06/24/15  Yes Noralee Space, MD  vitamin E 400 UNIT capsule Take 400 Units by mouth daily.   Yes Historical Provider, MD  EPINEPHrine (EPIPEN 2-PAK) 0.3 mg/0.3 mL IJ SOAJ injection Inject 0.3 mLs (0.3 mg total) into the muscle once. 07/25/15   Orlie Dakin, MD   Allergies  Allergen Reactions  . Levofloxacin Shortness Of Breath   Review of Systems  Unable to perform ROS: Acuity of condition    Physical Exam  Constitutional:  Frail, chronically ill appearing  HENT:  Head: Normocephalic and atraumatic.  Cardiovascular: Normal rate and regular rhythm.   Pulmonary/Chest: No respiratory distress.  Weak voice quality, able to complete sentences  Abdominal: Soft. She exhibits no distension.  Neurological: She is alert.  Skin: Skin is warm and dry.  Psychiatric:  Crying for 1 minute after I handed her my card  Nursing note and vitals reviewed.   Vital Signs: BP (!) 171/59   Pulse (!) 115   Temp 98.3 F (36.8 C) (Axillary)   Resp 18   Ht '5\' 3"'$  (1.6 m)   Wt 67.5 kg (148  lb 13 oz)   SpO2 91%   BMI 26.36 kg/m  Pain Assessment: 0-10 POSS *See Group Information*: S-Acceptable,Sleep, easy to arouse Pain Score: Asleep   SpO2: SpO2: 91 % O2 Device:SpO2: 91 % O2 Flow Rate: .O2 Flow Rate (L/min): 2 L/min  IO: Intake/output summary:  Intake/Output Summary (Last 24 hours) at 01/14/16 1549 Last data filed at 01/14/16 1300  Gross per 24 hour  Intake              270 ml  Output             1050 ml  Net             -780 ml    LBM: Last BM Date: 01/13/16 Baseline Weight: Weight: 65.8 kg (145 lb) Most recent weight: Weight: 67.5 kg (148 lb 13 oz)     Palliative Assessment/Data:   Flowsheet Rows   Flowsheet Row Most Recent Value  Intake Tab  Referral Department  Hospitalist  Unit at Time of Referral  ICU  Palliative Care Primary Diagnosis  Pulmonary  Date Notified  01/14/16  Palliative Care Type  New Palliative care  Reason for referral  Advance Care Planning, Clarify Goals of Care  Date of Admission  12/31/15  Date first seen by Palliative Care  01/14/16  # of days Palliative referral response time  0 Day(s)  # of days IP prior to Palliative referral  14  Clinical Assessment  Palliative Performance Scale Score  40%  Pain Max last 24 hours  Not able to report  Pain Min Last 24 hours  Not able to report  Dyspnea Max Last 24 Hours  Not able to report  Dyspnea Min Last 24 hours  Not able to report  Psychosocial & Spiritual Assessment  Palliative Care Outcomes  Who was at the meeting?  Patient and sister Hyman Bible  Palliative Care Outcomes  Clarified goals of care, Provided end of life care assistance, Provided psychosocial or spiritual support  Patient/Family wishes: Interventions discontinued/not started   Mechanical Ventilation  Palliative Care follow-up planned  -- [Working for palliative services at home]  Time In: 1450 Time Out: 1525 Time Total: 35 minutes Greater than 50%  of this time was spent counseling and coordinating  care related to the above assessment and plan.  Signed by: Drue Novel, NP   Please contact Palliative Medicine Team phone at [phone] for questions and concerns.  For individual provider: See Shea Evans

## 2016-01-14 NOTE — Progress Notes (Signed)
Date:  January 14, 2016 Chart reviewed for concurrent status and case management needs. Will continue to follow the patient for status change: Discharge Planning: following for needs Expected discharge date: [id number] Marcelle SmilingRhonda Skie Vitrano, BSN, [place]3, [place]M   [phone]

## 2016-01-15 DIAGNOSIS — J9600 Acute respiratory failure, unspecified whether with hypoxia or hypercapnia: Secondary | ICD-10-CM

## 2016-01-15 LAB — BASIC METABOLIC PANEL
Anion gap: 5 (ref 5–15)
BUN: 13 mg/dL (ref 6–20)
CALCIUM: 7.8 mg/dL — AB (ref 8.9–10.3)
CO2: 28 mmol/L (ref 22–32)
CREATININE: 0.37 mg/dL — AB (ref 0.44–1.00)
Chloride: 110 mmol/L (ref 101–111)
GFR calc Af Amer: >60 mL/min (ref >60)
GFR calc non Af Amer: >60 mL/min (ref >60)
GLUCOSE: 84 mg/dL (ref 65–99)
Potassium: 2.9 mmol/L — ABNORMAL LOW (ref 3.5–5.1)
Sodium: 143 mmol/L (ref 135–145)

## 2016-01-15 LAB — MAGNESIUM: MAGNESIUM: 2.1 mg/dL (ref 1.7–2.4)

## 2016-01-15 LAB — GLUCOSE, CAPILLARY
GLUCOSE-CAPILLARY: 119 mg/dL — AB (ref 65–99)
Glucose-Capillary: 132 mg/dL — ABNORMAL HIGH (ref 65–99)
Glucose-Capillary: 78 mg/dL (ref 65–99)
Glucose-Capillary: 80 mg/dL (ref 65–99)

## 2016-01-15 LAB — POTASSIUM: POTASSIUM: 4 mmol/L (ref 3.5–5.1)

## 2016-01-15 MED ORDER — STARCH (THICKENING) PO POWD: ORAL | 0 refills | Status: DC

## 2016-01-15 MED ORDER — POTASSIUM CHLORIDE 20 MEQ/15ML (10%) PO SOLN: 40.0000 meq | Freq: Every day | ORAL | Status: DC

## 2016-01-15 MED ORDER — PREDNISONE 10 MG PO TABS: ORAL_TABLET | ORAL | 0 refills | Status: DC

## 2016-01-15 MED ORDER — DOCUSATE SODIUM 100 MG PO CAPS: 100.0000 mg | ORAL_CAPSULE | Freq: Two times a day (BID) | ORAL | 0 refills | Status: DC | PRN

## 2016-01-15 MED ORDER — FAMOTIDINE 20 MG PO TABS
20.0000 mg | ORAL_TABLET | Freq: Two times a day (BID) | ORAL | Status: DC
  Administered 2016-01-15: 20 mg via ORAL
  Filled 2016-01-15: qty 1

## 2016-01-15 MED ORDER — CALCIUM CARBONATE 1250 (500 CA) MG PO TABS
1.0000 | ORAL_TABLET | Freq: Every day | ORAL | Status: DC
Start: 2016-01-15 — End: 2016-01-15
  Administered 2016-01-15: 500 mg via ORAL
  Filled 2016-01-15: qty 1

## 2016-01-15 MED ORDER — POTASSIUM CHLORIDE CRYS ER 20 MEQ PO TBCR
40.0000 meq | EXTENDED_RELEASE_TABLET | Freq: Once | ORAL | Status: AC
  Administered 2016-01-15: 40 meq via ORAL

## 2016-01-15 MED ORDER — NICOTINE 21 MG/24HR TD PT24: 21.0000 mg | MEDICATED_PATCH | Freq: Every day | TRANSDERMAL | 0 refills | Status: DC

## 2016-01-15 MED ORDER — DOCUSATE SODIUM 100 MG PO CAPS: 100.0000 mg | ORAL_CAPSULE | Freq: Two times a day (BID) | ORAL | Status: DC | PRN

## 2016-01-15 MED ORDER — GUAIFENESIN ER 600 MG PO TB12: 600.0000 mg | ORAL_TABLET | Freq: Two times a day (BID) | ORAL | 0 refills | Status: AC | PRN

## 2016-01-15 MED ORDER — POTASSIUM CHLORIDE CRYS ER 20 MEQ PO TBCR
40.0000 meq | EXTENDED_RELEASE_TABLET | Freq: Every day | ORAL | Status: DC
  Administered 2016-01-15: 40 meq via ORAL
  Filled 2016-01-15 (×2): qty 2

## 2016-01-15 NOTE — Progress Notes (Signed)
Nutrition Follow-up  DOCUMENTATION CODES:   Not applicable  INTERVENTION:  - Continue to encourage PO intakes.  - Will trial Magic Cup once/day, this supplement will provide  290 kcal and 9 grams of protein. - RD will continue to monitor for additional nutrition-related needs.   NUTRITION DIAGNOSIS:   Inadequate oral intake related to acute illness, poor appetite as evidenced by meal completion < 50%, per patient/family report. -revised.  GOAL:   Patient will meet greater than or equal to 90% of their needs -unmet at this time.   MONITOR:   PO intake, Supplement acceptance, Weight trends, Labs, Skin, I & O's  ASSESSMENT:   70 y/o female with advanced COPD still smoking who was admitted for acute respiratory failure with hypoxemia due to severe CAP and a COPD exacerbation. Required intubation 9/9.  9/22 Pt failed bedside swallow evaluation and subsequently underwent MBS. Noted information provided in SLP notes about findings and also hopeful for further diet advancement in the future; pt currently on Dysphagia 2, nectar-thick diet. Pt reports that she does not have any difficulty chewing or swallowing any items but that the food does not taste good and that this causes disinterest in eating. She and sister, who is at bedside, report that MD encouraged pt to hold her nose to help with dislike for taste when eating and drinking. Sister reminds pt of being on several medications and taste alteration may be a side effect of this. Pt reports that this AM she attempted to eat a few bites of oatmeal and scrambled eggs and that both of these items "tasted terrible."   Continued to encourage pt and focus on the benefits of good PO intakes. Will trial vanilla Magic Cup once/day and will adjust as needed at follow-up. Thick It order in place. No new weight since 9/19.  Medications reviewed; 1 mg oral folic acid, sliding scale Novolog, PRN Zofran, 40 mEq oral KCl/day, 40 mg oral  Prednisone/day. Labs reviewed; CBGs: 78-132 mg/dL this AM, K: 2.9 mmol/L, creatinine: 0.37 mg/dL, Ca: 7.8 mg/dL.     9/20 - Pt extubated shortly after noon yesterday and OGT removed at that time; TF d/c'ed.  - Pt remains NPO and per CCM NP note this AM, plan for SLP evaluation.  - Estimated nutrition needs updated based on extubation and calculated with weight from admission (65.9 kg) as weight trending up during hospitalization.  - Weight now beginning to trend back down and is currently +1.6 kg from admission weight.   9/19 - Pt with OGT with order for Vital High Protein @ 25 mL/hr with 30 mL Prostat BID.  - This regimen provides 800 kcal, 83 grams of protein, and 502 mL free water.  - At time of RD visit pt receiving Vital High Protein @ 40 mL/hr which is providing 960 kcal, 84 grams of protein, and 803 mL free water.  - Free water flush per MD order: 20 mL every 4 hours which is providing 120 mL/day.  - Estimated kcal need updated this AM and continue to use 65.9 kg to estimate needs.  - Weight now beginning to trend down toward admission weight.  - Pt able to nod/shake head and indicates that she is not having abdominal pain or nausea.  - Will adjust TF regimen based on re-estimated kcal need and current Propofol rate.   Patient is currently intubated on ventilator support MV: 7.6L/min Temp (24hrs), Avg:98 F (36.7 C), Min:96.6 F (35.9 C), Max:99.1 F (37.3 C) Propofol: 6.4ml/hr (164  kcal).  Drips:Fentanyl @ 75 mcg/hr, Propofol @ 15 mcg/kg/min.     Diet Order:  DIET DYS 2 Room service appropriate? Yes; Fluid consistency: Nectar Thick  Skin:  Reviewed, no issues  Last BM:  9/20  Height:   Ht Readings from Last 1 Encounters:  01/01/16 5\' 3"  (1.6 m)    Weight:   Wt Readings from Last 1 Encounters:  01/12/16 148 lb 13 oz (67.5 kg)    Ideal Body Weight:  52.27 kg  BMI:  Body mass index is 26.36 kg/m.  Estimated Nutritional Needs:   Kcal:   1400-1600  Protein:  65-75 grams  Fluid:  1.4-1.6 L/day  EDUCATION NEEDS:   No education needs identified at this time    Trenton GammonJessica Burlin Mcnair, MS, RD, LDN Inpatient Clinical Dietitian Pager # [phone] After hours/weekend pager # [phone]

## 2016-01-15 NOTE — Discharge Summary (Signed)
Physician Discharge Summary  Leslie Porter RUE:[id number] DOB: Mar 03, 1946 DOA: 12/31/2015  PCP: [name], MD  Admit date: 12/31/2015 Discharge date: 01/15/2016  Time spent: 45 minutes  Recommendations for Outpatient Follow-up:  Patient will be discharged to home with palliative care/hospice.  Patient will need to follow up with primary care provider within one week of discharge, repeat CBC and BMP.  Patient should continue medications as prescribed.  Patient should follow a dysphagia 2 diet.   Discharge Diagnoses:  Acute respirator failure with hypoxia and hypercarbia/acute COPD exacerbation Pneumonia Acute metabolic encephalopathy Leukocytosis Hypokalemia Deconditioning  Discharge Condition: Stable  Diet recommendation: dysphagia 2  Filed Weights   01/07/16 0500 01/08/16 0401 01/12/16 1478  Weight: 68.6 kg (151 lb 3.8 oz) 68.9 kg (151 lb 14.4 oz) 67.5 kg (148 lb 13 oz)    History of present illness:  On 12/31/2015 by Dr. Marcial Pacas Opyd Leslie Porter is a 70 y.o. female with medical history significant for COPD, tobacco abuse, and osteoporosis who presents to the emergency department for evaluation of worsening respiratory distress. Patient reports pain in her usual state of health until she developed increased dyspnea with cough approximately one week ago. Her symptoms continued to worsen despite her increasingly frequent use of home nebulizer. She saw her PCP on 12/30/2015 for evaluation of these complaints and was given an IM dose of Rocephin, started on a prednisone taper, and started on 3 L per minute of supplemental oxygen. EMS was called out yesterday but she refused transport to the ED at that time. She saw her PCP again today and received a second IM dose of Rocephin. EMS was again called about this evening for worsening respiratory distress and found the patient to be tripoding on their arrival. She was treated with 125 mg of Solu-Medrol and a DuoNeb en route to the ED.  She denies recent fevers or chills, but notes worsening cough, and describes a sensation of needing to cough up some phlegm, but being unable to. She denies any chest pain, palpitations, lower extremity edema, or orthopnea.  Hospital Course:  Acute respiratory failure with hypoxia and hypercarbia/acute COPD exacerbation -Likely secondary to continued smoking and right middle lobe pneumonia -Patient was intubated on 01/02/2016 and extubated on 01/12/2016 -Completed antibiotic course on 9/13 -Currently refusing nocturnal BiPAP for C Pap -Continue bronchodilators and oxygen as needed -Palliative care was consulted for goals of care -Plan for patient to be discharged with hospice/palliative care to follow at home  RML Pneumonia -Completed antibiotics -Strep pneumonia and legionella urine antigens negative -Respiratory culture showed normal flora  Acute metabolic encephalopathy -Secondary to hypercarbia -Appears to have resolved, patient currently at her baseline  Leukocytosis -Possibly reactive to steroids versus pneumonia -Patient currently afebrile  Hypokalemia -Replaced, currently potassium 4 -Magnesium level 2.1 -Patient should repeat her BMP within 1 week  Deconditioning -PT consulted and patient would like to return home  Tobacco abuse -Continue nicotine patch  Procedures/Significant Events 9/07 Admit with respiratory distress, RML consolidation  9/9 Echocardiogram EF of 65-70%, grade 1 diastolic dysfunction 9/15 More calm/alert, trial weaning 9/15 extuabted, bipap 9/16 Re-intubated  ETT 9/7 >> 9/15, 9/16 >> RUE PICC 9/12 >>  Consultations: PCCM Palliative care  Code status: DNR  Discharge Exam: Vitals:   01/15/16 1200 01/15/16 1300  BP:    Pulse:    Resp: (!) 29 (!) 26  Temp: 98.2 F (36.8 C)      General: Well developed, well nourished, NAD  HEENT: NCAT, mucous membranes moist.  Cardiovascular:  S1 S2 auscultated, RRR  Respiratory:  Diminished however clear, no wheezing noted  Abdomen: Soft, nontender, nondistended, + bowel sounds  Extremities: warm dry without cyanosis clubbing or edema  Neuro: AAOx2, Nonfocal  Psych: Appropriate mood and affect  Discharge Instructions Discharge Instructions    Discharge instructions    Complete by:  As directed    Patient will be discharged to home with palliative care/hospice.  Patient will need to follow up with primary care provider within one week of discharge, repeat CBC and BMP.  Patient should continue medications as prescribed.  Patient should follow a dysphagia 2 diet.     Current Discharge Medication List    START taking these medications   Details  docusate sodium (COLACE) 100 MG capsule Take 1 capsule (100 mg total) by mouth 2 (two) times daily as needed for mild constipation. Qty: 10 capsule, Refills: 0    food thickener (THICK IT) POWD Use as needed Qty: 1 Can, Refills: 0    guaiFENesin (MUCINEX) 600 MG 12 hr tablet Take 1 tablet (600 mg total) by mouth 2 (two) times daily as needed for cough or to loosen phlegm. Qty: 14 tablet, Refills: 0    nicotine (NICODERM CQ - DOSED IN MG/24 HOURS) 21 mg/24hr patch Place 1 patch (21 mg total) onto the skin daily. Qty: 28 patch, Refills: 0      CONTINUE these medications which have CHANGED   Details  predniSONE (DELTASONE) 10 MG tablet Take 40mg  (4 tabs) x 2 days, then taper to 30mg  (3 tabs) x 2 days, then 20mg  (2 tabs) x 2 days, then 10mg  (1 tab) x 2days Qty: 20 tablet, Refills: 0      CONTINUE these medications which have NOT CHANGED   Details  albuterol (PROVENTIL HFA;VENTOLIN HFA) 108 (90 Base) MCG/ACT inhaler Inhale 1-2 puffs into the lungs every 6 (six) hours as needed for wheezing or shortness of breath.    alendronate (FOSAMAX) 70 MG tablet Take 70 mg by mouth every Sunday.  Refills: 11    budesonide-formoterol (SYMBICORT) 160-4.5 MCG/ACT inhaler Inhale 2 puffs into the lungs 2 (two) times daily.      Calcium Carbonate (CALCIUM 600 PO) Take 1 tablet by mouth daily.    ipratropium-albuterol (DUONEB) 0.5-2.5 (3) MG/3ML SOLN Take 3 mLs by nebulization 2 (two) times daily.    Multiple Vitamin (MULTIVITAMIN) capsule Take 1 capsule by mouth daily.    umeclidinium bromide (INCRUSE ELLIPTA) 62.5 MCG/INH AEPB Inhale 1 puff into the lungs daily. Qty: 1 each, Refills: 11    vitamin E 400 UNIT capsule Take 400 Units by mouth daily.    EPINEPHrine (EPIPEN 2-PAK) 0.3 mg/0.3 mL IJ SOAJ injection Inject 0.3 mLs (0.3 mg total) into the muscle once. Qty: 1 Device, Refills: 0      STOP taking these medications     cefTRIAXone (ROCEPHIN) 10 g injection        Allergies  Allergen Reactions  . Levofloxacin Shortness Of Breath   Follow-up Information    NADEL,SCOTT M, MD Follow up on 01/27/2016.   Specialty:  Pulmonary Disease Why:  Appt at 11:00 AM Contact information: [address] [place] [address] [phone]            The results of significant diagnostics from this hospitalization (including imaging, microbiology, ancillary and laboratory) are listed below for reference.    Significant Diagnostic Studies: Dg Abd 1 View  Result Date: 01/09/2016 CLINICAL DATA:  Nasogastric tube placement EXAM: ABDOMEN - 1  VIEW COMPARISON:  Portable exam 2257 hours compared to 01/05/2016 FINDINGS: Tip of nasogastric tube projects over distal gastric antrum/pylorus. Lungs appear hyperinflated but clear. Bowel gas pattern unremarkable. IMPRESSION: Tip of nasogastric tube projects over distal gastric antrum/pylorus. Electronically Signed   By: Ulyses Southward M.D.   On: 01/09/2016 23:19   Ct Angio Chest Pe W Or Wo Contrast  Result Date: 12/31/2015 CLINICAL DATA:  70 year old female with dyspnea and tachycardia. Mass seen on chest radiograph. EXAM: CT ANGIOGRAPHY CHEST WITH CONTRAST TECHNIQUE: Multidetector CT imaging of the chest was performed using the standard protocol during bolus administration of  intravenous contrast. Multiplanar CT image reconstructions and MIPs were obtained to evaluate the vascular anatomy. CONTRAST:  100 cc Isovue 370 COMPARISON:  Chest radiograph dated 12/31/2015 FINDINGS: Evaluation of this exam is limited due to respiratory motion artifact. There is emphysematous changes of the lungs. There is near complete consolidative changes of the medial segment of the right middle lobe, likely atelectasis versus infiltrate. The remainder of the lungs are clear. There is no pleural effusion or pneumothorax. The central airways are patent. There is atherosclerotic calcification of the thoracic aorta. There is no aneurysmal dilatation or evidence of dissection. The origins of the great vessels of the aortic arch appear patent. Evaluation of the pulmonary arteries is limited due to respiratory motion artifact and suboptimal opacification of the peripheral branches. No definite central pulmonary artery embolus identified. There is no cardiomegaly or pericardial effusion. There is coronary vascular calcification. A 10 mm hypodense structure in the right hilar region (series 6, image 71) may represent hilar tissue or a top-normal lymph node. Mildly enlarged lymph node noted anterior to the carina which demonstrates fatty hilum. The esophagus is grossly unremarkable. No thyroid nodule identified. There is no axillary adenopathy. The chest wall soft tissues appear unremarkable. The there is osteopenia with degenerative changes of the spine. Old-appearing T6 an T7 compression deformity with anterior wedging resulting in thoracic kyphosis. 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 No acute fracture identified. The visualized upper abdomen is unremarkable. Review of the MIP images confirms the above findings. IMPRESSION: No CT evidence of central pulmonary artery embolus. Segmental consolidative changes of the right middle lobe. No definite obstructing mass identified. Clinical correlation and follow-up recommended.  Emphysema. Electronically Signed   By: Elgie Collard M.D.   On: 12/31/2015 20:11   Dg Chest Port 1 View  Result Date: 01/11/2016 CLINICAL DATA:  70 year old female with respiratory failure. COPD, smoker. Initial encounter. EXAM: PORTABLE CHEST 1 VIEW COMPARISON:  01/10/2016 and earlier. FINDINGS: Portable AP semi upright view at 0450 hours. Stable endotracheal tube, visible enteric tube, and PICC line. Mediastinal contours remain normal. Calcified aortic atherosclerosis. Stable lung volumes. Allowing for portable technique the lungs are clear. No pneumothorax or pleural effusion. IMPRESSION: 1.  Stable lines and tubes. 2.  No acute cardiopulmonary abnormality. 3.  Calcified aortic atherosclerosis. Electronically Signed   By: Odessa Fleming M.D.   On: 01/11/2016 07:10   Portable Chest Xray  Result Date: 01/10/2016 CLINICAL DATA:  70 y/o  F; acute respiratory failure. EXAM: PORTABLE CHEST 1 VIEW COMPARISON:  01/09/2016 chest radiograph. FINDINGS: Endotracheal tube is 3 cm from the carina. Enteric tube tip below the field of view in the abdomen. Left PICC line tip projects over lower SVC. Stable cardiomediastinal silhouette. Aortic atherosclerosis with arch calcifications. No new focal consolidation, pneumothorax, or pleural effusion. No significant interval change. IMPRESSION: Stable lines and tubes.  Clear lungs. Electronically Signed   By: Micah Noel  [place] M.D.   On: 01/10/2016 05:03   Portable Chest Xray  Result Date: 01/09/2016 CLINICAL DATA:  Intubation, acute respiratory failure, COPD, asthma, smoker EXAM: PORTABLE CHEST 1 VIEW COMPARISON:  Portable exam 1616 hours compared to 01/09/2016 at 0438 hours FINDINGS: Tip of endotracheal tube projects 3.3 cm above carina. LEFT arm PICC line tip projects over SVC. Normal heart size, mediastinal contours, and pulmonary vascularity. Atherosclerotic calcification aorta. Emphysematous changes compatible with COPD. No acute infiltrate, pleural effusion, or  pneumothorax. Bones demineralized. IMPRESSION: Satisfactory endotracheal tube position. COPD changes without acute infiltrate. Aortic atherosclerosis. Electronically Signed   By: Ulyses Southward M.D.   On: 01/09/2016 16:47   Dg Chest Port 1 View  Result Date: 01/09/2016 CLINICAL DATA:  Followup acute respiratory failure. Subsequent encounter. History of asthma/ COPD. EXAM: PORTABLE CHEST 1 VIEW COMPARISON:  01/07/2016 FINDINGS: Lungs are hyperexpanded. There are prominent bronchovascular markings. No evidence of pneumonia or edema. Cardiac silhouette is normal in size. No mediastinal or hilar masses. Left PICC is stable and well positioned. The endotracheal tube and nasal/ orogastric tube have been removed. IMPRESSION: 1. Status post removal of the endotracheal and nasogastric tubes. 2. No acute cardiopulmonary disease. 3. Findings of COPD. Electronically Signed   By: Amie Portland M.D.   On: 01/09/2016 07:21   Dg Chest Port 1 View  Result Date: 01/07/2016 CLINICAL DATA:  Respiratory failure, community-acquired pneumonia intubated patient, history of COPD, current smoker. EXAM: PORTABLE CHEST 1 VIEW COMPARISON:  Portable chest x-ray of January 06, 2016 FINDINGS: The lungs remain mildly hyperinflated but clear. The heart and pulmonary vascularity are normal. There is calcification in the wall of the aortic arch. The endotracheal tube tip lies 4.1 cm above the carina. The esophagogastric tube tip projects below the inferior margin of the image. The left-sided PICC line tip projects over the midportion of the SVC. IMPRESSION: 1. COPD. No pneumonia, CHF, nor other acute cardiopulmonary abnormality. 2. Aortic atherosclerosis. 3. The support tubes are in reasonable position. Electronically Signed   By: David  [place] M.D.   On: 01/07/2016 07:30   Dg Chest Port 1 View  Result Date: 01/06/2016 CLINICAL DATA:  Respiratory failure. EXAM: PORTABLE CHEST 1 VIEW COMPARISON:  Chest radiograph 01/04/2016 FINDINGS:  Endotracheal tube tip is in unchanged position, well above the carina. The enteric tube courses beyond the field of view of the side port overlies the stomach. Left PICC line tip terminates at the right atrium. Atherosclerotic calcification within the thoracic aorta. No pneumothorax or sizable pleural effusion. No pulmonary edema or focal airspace consolidation. IMPRESSION: 1. Endotracheal tube tip below level of the clavicular heads and well above the carina. 2. Left approach PICC line tip at the right atrium. Electronically Signed   By: Deatra Robinson M.D.   On: 01/06/2016 05:39   Dg Chest Port 1 View  Result Date: 01/04/2016 CLINICAL DATA:  Respiratory failure with hypoxemia. Ventilator support. EXAM: PORTABLE CHEST 1 VIEW COMPARISON:  01/03/2016 FINDINGS: Endotracheal tube has its tip 3 cm above the carina. Nasogastric tube enters the abdomen. The lungs appear well aerated and clear. Aortic atherosclerosis noted. No bone abnormality. IMPRESSION: Endotracheal tube and nasogastric tube well positioned. Lungs clear. Electronically Signed   By: Paulina Fusi M.D.   On: 01/04/2016 07:26   Dg Chest Port 1 View  Result Date: 01/03/2016 CLINICAL DATA:  Acute respiratory failure with hypoxemia. EXAM: PORTABLE CHEST 1 VIEW COMPARISON:  Radiographs of January 01, 2016. FINDINGS: The heart size and mediastinal contours are within  normal limits. Both lungs are clear. Endotracheal tube is in grossly good position. Atherosclerosis of thoracic aorta is noted. Distal tip of nasogastric tube is seen in proximal stomach. The visualized skeletal structures are unremarkable. IMPRESSION: Aortic atherosclerosis. Distal tip of nasogastric tube seen in proximal stomach. Endotracheal tube in grossly good position. No acute cardiopulmonary abnormality seen. Electronically Signed   By: Lupita Raider, M.D.   On: 01/03/2016 07:13   Portable Chest Xray  Result Date: 01/01/2016 CLINICAL DATA:  Difficult intubation. EXAM: PORTABLE  CHEST 1 VIEW COMPARISON:  CT 12/31/2015 FINDINGS: Endotracheal T 3.5 cm from carina. NG tube extends into the stomach. Normal cardiac silhouette. Lungs are clear. No pneumothorax. Small volume of fluid along the RIGHT lung apex appears new. IMPRESSION: Endotracheal tube in appropriate position. New Small RIGHT apical fluid collection. Recommend attention on follow-up radiographs. Electronically Signed   By: Genevive Bi M.D.   On: 01/01/2016 10:43   Dg Chest Portable 1 View  Result Date: 12/31/2015 CLINICAL DATA:  Severe SOB today. Hx of asthma, COPD. Everyday smoker x1ppd x50 years. EXAM: PORTABLE CHEST 1 VIEW COMPARISON:  07/25/2015 FINDINGS: Atherosclerotic calcification of the aortic arch. Abnormal fullness of the right hilum. Suspected underlying emphysema. Interstitial accentuation in the lung bases. Mild biapical pleuroparenchymal scarring. Vague nodularity at the left lung base may relate to the adjacent rib the hand. Airway thickening is present, suggesting bronchitis or reactive airways disease. IMPRESSION: 1. Abnormal and increased prominence the right hilum. Hilar mass or adenopathy not excluded. CT chest recommended, with contrast if feasible. 2. Suspected emphysema. 3. Airway thickening is present, suggesting bronchitis or reactive airways disease. Interstitial accentuation at the lung bases. Electronically Signed   By: Gaylyn Rong M.D.   On: 12/31/2015 18:40   Dg Abd Portable 1v  Result Date: 01/05/2016 CLINICAL DATA:  OG tube placement EXAM: PORTABLE ABDOMEN - 1 VIEW COMPARISON:  None. FINDINGS: The orogastric tube extends into the stomach. Tip is in the gastric body. Proximal port is just beyond the EG junction. IMPRESSION: OG tube extends into the stomach. Electronically Signed   By: Ellery Plunk M.D.   On: 01/05/2016 01:22   Dg Swallowing Func-speech Pathology  Result Date: 01/13/2016 Objective Swallowing Evaluation: Type of Study: MBS-Modified Barium Swallow Study  Patient Details Name: Deatrice Spanbauer MRN: [id number] Date of Birth: 12/10/1945 Today's Date: 01/13/2016 Time: SLP Start Time (ACUTE ONLY): 1230-SLP Stop Time (ACUTE ONLY): 1258 SLP Time Calculation (min) (ACUTE ONLY): 28 min Past Medical History: Past Medical History: Diagnosis Date . Asthma  . COPD (chronic obstructive pulmonary disease) (HCC)  . Osteoporosis  Past Surgical History: Past Surgical History: Procedure Laterality Date . VAGINAL HYSTERECTOMY   HPI: 70 yo female adm to [facility] with respiratory failure - required intubation- failed extubation trial and needed to be reintubated.  Pt intubated 9/7-9/15, reintubated 9/16-9/19.  Swallow evaluation ordered.   Subjective: pt awake in chair Assessment / Plan / Recommendation CHL IP CLINICAL IMPRESSIONS 01/13/2016 Therapy Diagnosis Mild oral phase dysphagia;Mild pharyngeal phase dysphagia;Other (Comment) Clinical Impression  Pt presents with mild oropharyngeal dysphagia with sensorimotor deficits likely from COPD/extended intubation.  Decreased oral coordination resulting in delayed transiting and premature spillage of into pharynx.  Pharyngeal swallow was delayed with some boluses spilling over epiglottis before swallow was triggered.  Pt with minimal laryngeal penetration of thin and CRACKER bolus without sensation.  Use of chin tuck posture effective but pt needed cues to conduct and will need full supervision for airway protection. Mild vallecular residuals  present without pt awareness but further swallows decrease it. Using live video, educated pt to findings and reinforced effective compensation strategies.  Recommend dys2/nectar and allowing pt ice chips with strict precautions including intermittent dry swallows and chin tuck with EVERY swallow.  Will follow clinically for indication for dietary advancement.  Pt may benefit from repeat MBS due to SILENT nature of dysphagia.  Thanks for this consult.    Impact on safety and function Mild aspiration risk;Moderate  aspiration risk   CHL IP TREATMENT RECOMMENDATION 01/13/2016 Treatment Recommendations Therapy as outlined in treatment plan below   Prognosis 01/13/2016 Prognosis for Safe Diet Advancement Good Barriers to Reach Goals -- Barriers/Prognosis Comment -- CHL IP DIET RECOMMENDATION 01/13/2016 SLP Diet Recommendations Dysphagia 2 (Fine chop) solids;Nectar thick liquid;Ice chips PRN after oral care Liquid Administration via Cup;Straw Medication Administration Whole meds with puree Compensations Minimize environmental distractions;Slow rate;Small sips/bites;Chin tuck;Follow solids with liquid Postural Changes Seated upright at 90 degrees;Remain semi-upright after after feeds/meals (Comment)   CHL IP OTHER RECOMMENDATIONS 01/13/2016 Recommended Consults -- Oral Care Recommendations Oral care BID Other Recommendations Order thickener from pharmacy;Have oral suction available   No flowsheet data found.  CHL IP FREQUENCY AND DURATION 01/13/2016 Speech Therapy Frequency (ACUTE ONLY) min 2x/week Treatment Duration 2 weeks      CHL IP ORAL PHASE 01/13/2016 Oral Phase Impaired Oral - Pudding Teaspoon -- Oral - Pudding Cup -- Oral - Honey Teaspoon -- Oral - Honey Cup -- Oral - Nectar Teaspoon Delayed oral transit Oral - Nectar Cup Delayed oral transit Oral - Nectar Straw Delayed oral transit Oral - Thin Teaspoon Delayed oral transit;Decreased bolus cohesion Oral - Thin Cup Decreased bolus cohesion;Delayed oral transit;Premature spillage Oral - Thin Straw Delayed oral transit;Decreased bolus cohesion;Premature spillage Oral - Puree Delayed oral transit;Decreased bolus cohesion;Premature spillage;Weak lingual manipulation;Reduced posterior propulsion Oral - Mech Soft -- Oral - Regular Weak lingual manipulation;Impaired mastication;Delayed oral transit;Premature spillage Oral - Multi-Consistency -- Oral - Pill -- Oral Phase - Comment --  CHL IP PHARYNGEAL PHASE 01/13/2016 Pharyngeal Phase Impaired Pharyngeal- Pudding Teaspoon -- Pharyngeal  -- Pharyngeal- Pudding Cup -- Pharyngeal -- Pharyngeal- Honey Teaspoon -- Pharyngeal -- Pharyngeal- Honey Cup -- Pharyngeal -- Pharyngeal- Nectar Teaspoon Delayed swallow initiation-vallecula;Reduced airway/laryngeal closure Pharyngeal -- Pharyngeal- Nectar Cup Delayed swallow initiation-vallecula;Reduced airway/laryngeal closure Pharyngeal -- Pharyngeal- Nectar Straw Delayed swallow initiation-vallecula;Reduced airway/laryngeal closure Pharyngeal -- Pharyngeal- Thin Teaspoon Delayed swallow initiation-vallecula;Delayed swallow initiation-pyriform sinuses;Reduced airway/laryngeal closure Pharyngeal -- Pharyngeal- Thin Cup Delayed swallow initiation-pyriform sinuses;Reduced airway/laryngeal closure Pharyngeal -- Pharyngeal- Thin Straw Delayed swallow initiation-pyriform sinuses;Reduced airway/laryngeal closure;Penetration/Aspiration during swallow Pharyngeal -- Pharyngeal- Puree Delayed swallow initiation-vallecula;Reduced airway/laryngeal closure;Pharyngeal residue - valleculae Pharyngeal -- Pharyngeal- Mechanical Soft -- Pharyngeal -- Pharyngeal- Regular Penetration/Aspiration during swallow;Delayed swallow initiation-vallecula;Delayed swallow initiation-pyriform sinuses Pharyngeal Material enters airway, CONTACTS cords and then ejected out Pharyngeal- Multi-consistency -- Pharyngeal -- Pharyngeal- Pill -- Pharyngeal -- Pharyngeal Comment chin tuck posture with small bolus aids airway protection; pt needed cues to follow directions for adequate posturing  CHL IP CERVICAL ESOPHAGEAL PHASE 01/13/2016 Cervical Esophageal Phase Impaired Pudding Teaspoon -- Pudding Cup -- Honey Teaspoon -- Honey Cup -- Nectar Teaspoon -- Nectar Cup -- Nectar Straw -- Thin Teaspoon -- Thin Cup -- Thin Straw -- Puree -- Mechanical Soft -- Regular -- Multi-consistency -- Pill -- Cervical Esophageal Comment appearance of minimally slow clearance at distal esophagus, further liquid boluses faciliated clearance No flowsheet data found.  Chales Abrahams 01/13/2016, 2:12 PM  Microbiology: Recent Results (from the past 240 hour(s))  Culture, respiratory (NON-Expectorated)     Status: None   Collection Time: 01/10/16 10:52 AM  Result Value Ref Range Status   Specimen Description TRACHEAL ASPIRATE  Final   Special Requests NONE  Final   Gram Stain   Final    MODERATE WBC PRESENT,BOTH PMN AND MONONUCLEAR RARE SQUAMOUS EPITHELIAL CELLS PRESENT NO ORGANISMS SEEN    Culture   Final    Consistent with normal respiratory flora. [facility]    Report Status 01/13/2016 FINAL  Final     Labs: Basic Metabolic Panel:  Recent Labs Lab 01/10/16 0420 01/11/16 0423 01/12/16 0551 01/14/16 0405 01/15/16 0340 01/15/16 1300  NA 138 140 139 139 143  --   K 3.6 3.7 3.8 3.3* 2.9* 4.0  CL 101 103 102 104 110  --   CO2 30 32 32 27 28  --   GLUCOSE 198* 185* 154* 111* 84  --   BUN 31* 27* 33* 19 13  --   CREATININE 0.42* 0.40* 0.38* 0.34* 0.37*  --   CALCIUM 8.1* 8.7* 8.3* 7.7* 7.8*  --   MG  --   --  1.8  --  2.1  --   PHOS  --   --  3.5  --   --   --    Liver Function Tests: No results for input(s): AST, ALT, ALKPHOS, BILITOT, PROT, ALBUMIN in the last 168 hours. No results for input(s): LIPASE, AMYLASE in the last 168 hours. No results for input(s): AMMONIA in the last 168 hours. CBC:  Recent Labs Lab 01/09/16 0500 01/10/16 0420 01/11/16 0423 01/12/16 0551 01/14/16 0405  WBC 16.3* 13.4* 15.9* 14.7* 15.5*  HGB 13.6 12.5 12.7 11.6* 12.8  HCT 41.4 38.7 38.9 35.8* 38.8  MCV 100.7* 102.7* 100.8* 102.6* 100.0  PLT 308 282 300 292 337   Cardiac Enzymes:  Recent Labs Lab 01/12/16 0551  CKTOTAL 20*  CKMB 2.0   BNP: BNP (last 3 results) No results for input(s): BNP in the last 8760 hours.  ProBNP (last 3 results) No results for input(s): PROBNP in the last 8760 hours.  CBG:  Recent Labs Lab 01/14/16 2013 01/15/16 0003 01/15/16 0353 01/15/16 0827 01/15/16 1122    GLUCAP 128* 132* 78 80 119*       Signed:  Tierany Appleby  [facility] 01/15/2016, 2:15 PM

## 2016-01-15 NOTE — Progress Notes (Addendum)
Date: January 12, 2016 Patient to go home with [facility]iative care/patient is agreeable to this.  No equipment needs has home o2. Marcelle Smilinghonda Tashai Catino, RN, BSN, [place]   [phone]

## 2016-01-15 NOTE — Discharge Instructions (Signed)
Chronic Obstructive Pulmonary Disease Chronic obstructive pulmonary disease (COPD) is a common lung condition in which airflow from the lungs is limited. COPD is a general term that can be used to describe many different lung problems that limit airflow, including both chronic bronchitis and emphysema. If you have COPD, your lung function will probably never return to normal, but there are measures you can take to improve lung function and make yourself feel better. CAUSES   Smoking (common).  Exposure to secondhand smoke.  Genetic problems.  Chronic inflammatory lung diseases or recurrent infections. SYMPTOMS  Shortness of breath, especially with physical activity.  Deep, persistent (chronic) cough with a large amount of thick mucus.  Wheezing.  Rapid breaths (tachypnea).  Gray or bluish discoloration (cyanosis) of the skin, especially in your fingers, toes, or lips.  Fatigue.  Weight loss.  Frequent infections or episodes when breathing symptoms become much worse (exacerbations).  Chest tightness. DIAGNOSIS Your health care provider will take a medical history and perform a physical examination to diagnose COPD. Additional tests for COPD may include:  Lung (pulmonary) function tests.  Chest X-ray.  CT scan.  Blood tests. TREATMENT  Treatment for COPD may include:  Inhaler and nebulizer medicines. These help manage the symptoms of COPD and make your breathing more comfortable.  Supplemental oxygen. Supplemental oxygen is only helpful if you have a low oxygen level in your blood.  Exercise and physical activity. These are beneficial for nearly all people with COPD.  Lung surgery or transplant.  Nutrition therapy to gain weight, if you are underweight.  Pulmonary rehabilitation. This may involve working with a team of health care providers and specialists, such as respiratory, occupational, and physical therapists. HOME CARE INSTRUCTIONS  Take all medicines  (inhaled or pills) as directed by your health care provider.  Avoid over-the-counter medicines or cough syrups that dry up your airway (such as antihistamines) and slow down the elimination of secretions unless instructed otherwise by your health care provider.  If you are a smoker, the most important thing that you can do is stop smoking. Continuing to smoke will cause further lung damage and breathing trouble. Ask your health care provider for help with quitting smoking. He or she can direct you to community resources or hospitals that provide support.  Avoid exposure to irritants such as smoke, chemicals, and fumes that aggravate your breathing.  Use oxygen therapy and pulmonary rehabilitation if directed by your health care provider. If you require home oxygen therapy, ask your health care provider whether you should purchase a pulse oximeter to measure your oxygen level at home.  Avoid contact with individuals who have a contagious illness.  Avoid extreme temperature and humidity changes.  Eat healthy foods. Eating smaller, more frequent meals and resting before meals may help you maintain your strength.  Stay active, but balance activity with periods of rest. Exercise and physical activity will help you maintain your ability to do things you want to do.  Preventing infection and hospitalization is very important when you have COPD. Make sure to receive all the vaccines your health care provider recommends, especially the pneumococcal and influenza vaccines. Ask your health care provider whether you need a pneumonia vaccine.  Learn and use relaxation techniques to manage stress.  Learn and use controlled breathing techniques as directed by your health care provider. Controlled breathing techniques include:  Pursed lip breathing. Start by breathing in (inhaling) through your nose for 1 second. Then, purse your lips as if you were   going to whistle and breathe out (exhale) through the  pursed lips for 2 seconds.  Diaphragmatic breathing. Start by putting one hand on your abdomen just above your waist. Inhale slowly through your nose. The hand on your abdomen should move out. Then purse your lips and exhale slowly. You should be able to feel the hand on your abdomen moving in as you exhale.  Learn and use controlled coughing to clear mucus from your lungs. Controlled coughing is a series of short, progressive coughs. The steps of controlled coughing are: 1. Lean your head slightly forward. 2. Breathe in deeply using diaphragmatic breathing. 3. Try to hold your breath for 3 seconds. 4. Keep your mouth slightly open while coughing twice. 5. Spit any mucus out into a tissue. 6. Rest and repeat the steps once or twice as needed. SEEK MEDICAL CARE IF:  You are coughing up more mucus than usual.  There is a change in the color or thickness of your mucus.  Your breathing is more labored than usual.  Your breathing is faster than usual. SEEK IMMEDIATE MEDICAL CARE IF:  You have shortness of breath while you are resting.  You have shortness of breath that prevents you from:  Being able to talk.  Performing your usual physical activities.  You have chest pain lasting longer than 5 minutes.  Your skin color is more cyanotic than usual.  You measure low oxygen saturations for longer than 5 minutes with a pulse oximeter. MAKE SURE YOU:  Understand these instructions.  Will watch your condition.  Will get help right away if you are not doing well or get worse.   This information is not intended to replace advice given to you by your health care provider. Make sure you discuss any questions you have with your health care provider.   Document Released: 01/19/2005 Document Revised: 05/02/2014 Document Reviewed: 12/06/2012 Elsevier Interactive Patient Education 2016 [employer]  

## 2016-01-27 ENCOUNTER — Inpatient Hospital Stay: Payer: Medicare Other | Admitting: Pulmonary Disease

## 2016-04-06 ENCOUNTER — Emergency Department (HOSPITAL_COMMUNITY): Payer: Medicare Other

## 2016-04-06 ENCOUNTER — Encounter (HOSPITAL_COMMUNITY): Payer: Self-pay | Admitting: *Deleted

## 2016-04-06 ENCOUNTER — Emergency Department (HOSPITAL_COMMUNITY)
Admission: EM | Admit: 2016-04-06 | Discharge: 2016-04-06 | Disposition: A | Discharge status: Home / Self Care | Payer: Medicare Other | Attending: Emergency Medicine | Admitting: Emergency Medicine

## 2016-04-06 DIAGNOSIS — E876 Hypokalemia: Secondary | ICD-10-CM | POA: Diagnosis not present

## 2016-04-06 DIAGNOSIS — R5383 Other fatigue: Secondary | ICD-10-CM

## 2016-04-06 DIAGNOSIS — J449 Chronic obstructive pulmonary disease, unspecified: Secondary | ICD-10-CM | POA: Diagnosis not present

## 2016-04-06 DIAGNOSIS — R0602 Shortness of breath: Secondary | ICD-10-CM | POA: Insufficient documentation

## 2016-04-06 DIAGNOSIS — F17210 Nicotine dependence, cigarettes, uncomplicated: Secondary | ICD-10-CM | POA: Diagnosis not present

## 2016-04-06 DIAGNOSIS — J45909 Unspecified asthma, uncomplicated: Secondary | ICD-10-CM | POA: Insufficient documentation

## 2016-04-06 DIAGNOSIS — R51 Headache: Secondary | ICD-10-CM | POA: Insufficient documentation

## 2016-04-06 LAB — URINALYSIS, ROUTINE W REFLEX MICROSCOPIC
BILIRUBIN URINE: NEGATIVE
GLUCOSE, UA: NEGATIVE mg/dL
Hgb urine dipstick: NEGATIVE
KETONES UR: NEGATIVE mg/dL
NITRITE: NEGATIVE
PH: 6 (ref 5.0–8.0)
PROTEIN: NEGATIVE mg/dL
Specific Gravity, Urine: <1.005 — ABNORMAL LOW (ref 1.005–1.030)

## 2016-04-06 LAB — BASIC METABOLIC PANEL
Anion gap: 9 (ref 5–15)
BUN: 16 mg/dL (ref 6–20)
CO2: 26 mmol/L (ref 22–32)
CREATININE: 0.64 mg/dL (ref 0.44–1.00)
Calcium: 8.7 mg/dL — ABNORMAL LOW (ref 8.9–10.3)
Chloride: 103 mmol/L (ref 101–111)
GFR calc Af Amer: >60 mL/min (ref >60)
GLUCOSE: 153 mg/dL — AB (ref 65–99)
POTASSIUM: 3.1 mmol/L — AB (ref 3.5–5.1)
SODIUM: 138 mmol/L (ref 135–145)

## 2016-04-06 LAB — CBC
HEMATOCRIT: 40.5 % (ref 36.0–46.0)
Hemoglobin: 13.4 g/dL (ref 12.0–15.0)
MCH: 33.8 pg (ref 26.0–34.0)
MCHC: 33.1 g/dL (ref 30.0–36.0)
MCV: 102.3 fL — ABNORMAL HIGH (ref 78.0–100.0)
PLATELETS: 343 10*3/uL (ref 150–400)
RBC: 3.96 MIL/uL (ref 3.87–5.11)
RDW: 12.4 % (ref 11.5–15.5)
WBC: 10.9 10*3/uL — ABNORMAL HIGH (ref 4.0–10.5)

## 2016-04-06 LAB — CBG MONITORING, ED: Glucose-Capillary: 143 mg/dL — ABNORMAL HIGH (ref 65–99)

## 2016-04-06 LAB — URINALYSIS, MICROSCOPIC (REFLEX)
Bacteria, UA: NONE SEEN
RBC / HPF: NONE SEEN RBC/hpf (ref 0–5)

## 2016-04-06 MED ORDER — POTASSIUM CHLORIDE 20 MEQ PO PACK
20.0000 meq | PACK | Freq: Once | ORAL | Status: AC
  Administered 2016-04-06: 20 meq via ORAL
  Filled 2016-04-06: qty 1

## 2016-04-06 NOTE — ED Provider Notes (Signed)
[facility]-EMERGENCY DEPT Provider Note   CSN: [id number] Arrival date & time: 04/06/16  1130  By signing my name below, I, Leslie Porter, attest that this documentation has been prepared under the direction and in the presence of Leslie Dibbles, MD. Electronically Signed: Doreatha Porter, ED Scribe. 04/06/16. 12:36 PM.     History   Chief Complaint Chief Complaint  Patient presents with  . Fatigue    HPI Leslie Porter is a 70 y.o. female with h/o COPD who presents to the Emergency Department complaining of moderate "lightheaded" dizziness that began today. She also complains of intermittent HA that began a few weeks ago after d/c from the hospital for tx of a lung infection, and worsened today. Pt states she went to PCP a week ago and felt well, but a few days later began to have increased difficulty breathing and SOB. Pt was also admitted to the hospital on 03/19/16 for COPD exacerbation. No alleviating factors noted. She denies syncope, vomiting, diarrhea, fever, chills, CP.   The history is provided by the patient. No language interpreter was used.    Past Medical History:  Diagnosis Date  . Asthma   . COPD (chronic obstructive pulmonary disease) (HCC)   . Osteoporosis     Patient Active Problem List   Diagnosis Date Noted  . Palliative care encounter   . Goals of care, counseling/discussion   . DNR (do not resuscitate) discussion   . Acute hypoxemic respiratory failure (HCC) 12/31/2015  . COPD with acute exacerbation (HCC) 12/31/2015  . CAP (community acquired pneumonia) 12/31/2015  . Respiratory failure (HCC) 12/31/2015  . COPD mixed type (HCC) 06/24/2015  . Cigarette smoker 06/24/2015  . Dyspnea 06/24/2015    Past Surgical History:  Procedure Laterality Date  . VAGINAL HYSTERECTOMY      OB History    No data available       Home Medications    Prior to Admission medications   Medication Sig Start Date End Date Taking? Authorizing Provider  albuterol (PROVENTIL  HFA;VENTOLIN HFA) 108 (90 Base) MCG/ACT inhaler Inhale 1-2 puffs into the lungs every 6 (six) hours as needed for wheezing or shortness of breath.    Historical Provider, MD  alendronate (FOSAMAX) 70 MG tablet Take 70 mg by mouth every Sunday.  12/29/15   Historical Provider, MD  budesonide-formoterol (SYMBICORT) 160-4.5 MCG/ACT inhaler Inhale 2 puffs into the lungs 2 (two) times daily.    Historical Provider, MD  Calcium Carbonate (CALCIUM 600 PO) Take 1 tablet by mouth daily.    Historical Provider, MD  docusate sodium (COLACE) 100 MG capsule Take 1 capsule (100 mg total) by mouth 2 (two) times daily as needed for mild constipation. 01/15/16   Leslie Mikhail, DO  EPINEPHrine (EPIPEN 2-PAK) 0.3 mg/0.3 mL IJ SOAJ injection Inject 0.3 mLs (0.3 mg total) into the muscle once. 07/25/15   Leslie Sou, MD  food thickener (THICK IT) POWD Use as needed 01/15/16   Leslie Petrin, DO  guaiFENesin (MUCINEX) 600 MG 12 hr tablet Take 1 tablet (600 mg total) by mouth 2 (two) times daily as needed for cough or to loosen phlegm. 01/15/16   Leslie Mikhail, DO  ipratropium-albuterol (DUONEB) 0.5-2.5 (3) MG/3ML SOLN Take 3 mLs by nebulization 2 (two) times daily.    Historical Provider, MD  Multiple Vitamin (MULTIVITAMIN) capsule Take 1 capsule by mouth daily.    Historical Provider, MD  nicotine (NICODERM CQ - DOSED IN MG/24 HOURS) 21 mg/24hr patch Place 1 patch (21 mg total)  onto the skin daily. 01/16/16   Leslie Mikhail, DO  predniSONE (DELTASONE) 10 MG tablet Take 40mg  (4 tabs) x 2 days, then taper to 30mg  (3 tabs) x 2 days, then 20mg  (2 tabs) x 2 days, then 10mg  (1 tab) x 2days 01/15/16   Leslie Mikhail, DO  umeclidinium bromide (INCRUSE ELLIPTA) 62.5 MCG/INH AEPB Inhale 1 puff into the lungs daily. 06/24/15   Leslie McalpineScott M Nadel, MD  vitamin E 400 UNIT capsule Take 400 Units by mouth daily.    Historical Provider, MD    Family History Family History  Problem Relation Age of Onset  . Heart attack Father   . Heart  disease Mother     Social History Social History  Substance Use Topics  . Smoking status: Current Every Day Smoker    Packs/day: 0.50    Years: 50.00    Types: Cigarettes  . Smokeless tobacco: Never Used  . Alcohol use 0.0 oz/week     Comment: Occasional     Allergies   Levofloxacin   Review of Systems Review of Systems  Constitutional: Negative for chills and fever.  Respiratory: Positive for shortness of breath.   Cardiovascular: Negative for chest pain.  Gastrointestinal: Negative for diarrhea and vomiting.  Neurological: Positive for dizziness, light-headedness and headaches. Negative for syncope.  All other systems reviewed and are negative.    Physical Exam Updated Vital Signs BP 136/65   Pulse 96   Temp 97.9 F (36.6 C) (Oral)   Resp 19   Ht 5\' 3"  (1.6 m)   Wt 65.8 kg   SpO2 93%   BMI 25.69 kg/m   Physical Exam  Constitutional: She appears well-developed and well-nourished. No distress.  HENT:  Head: Normocephalic and atraumatic.  Right Ear: External ear normal.  Left Ear: External ear normal.  Eyes: Conjunctivae are normal. Right eye exhibits no discharge. Left eye exhibits no discharge. No scleral icterus.  Neck: Neck supple. No tracheal deviation present.  Cardiovascular: Normal rate, regular rhythm and intact distal pulses.   Pulmonary/Chest: Effort normal and breath sounds normal. No stridor. No respiratory distress. She has no wheezes. She has no rales.  Hoarse voice.   Abdominal: Soft. Bowel sounds are normal. She exhibits no distension. There is no tenderness. There is no rebound and no guarding.  Musculoskeletal: She exhibits no edema or tenderness.  Neurological: She is alert. She has normal strength. No cranial nerve deficit (no facial droop, extraocular movements intact, no slurred speech) or sensory deficit. She exhibits normal muscle tone. She displays no seizure activity. Coordination normal.  Skin: Skin is warm and dry. No rash noted.    Psychiatric: She has a normal mood and affect.  Nursing note and vitals reviewed.    ED Treatments / Results   DIAGNOSTIC STUDIES: Oxygen Saturation is 96% on RA, adequate by my interpretation.    COORDINATION OF CARE: 12:32 PM Discussed treatment plan with pt at bedside which includes lab work and pt agreed to plan.    Labs (all labs ordered are listed, but only abnormal results are displayed) Labs Reviewed  BASIC METABOLIC PANEL - Abnormal; Notable for the following:       Result Value   Potassium 3.1 (*)    Glucose, Bld 153 (*)    Calcium 8.7 (*)    All other components within normal limits  CBC - Abnormal; Notable for the following:    WBC 10.9 (*)    MCV 102.3 (*)    All other components  within normal limits  URINALYSIS, ROUTINE W REFLEX MICROSCOPIC - Abnormal; Notable for the following:    Specific Gravity, Urine <1.005 (*)    Leukocytes, UA TRACE (*)    All other components within normal limits  URINALYSIS, MICROSCOPIC (REFLEX) - Abnormal; Notable for the following:    Squamous Epithelial / LPF 0-5 (*)    All other components within normal limits  CBG MONITORING, ED - Abnormal; Notable for the following:    Glucose-Capillary 143 (*)    All other components within normal limits    EKG  EKG Interpretation  Date/Time:  Wednesday April 06 2016 11:38:58 EST Ventricular Rate:  102 PR Interval:    QRS Duration: 85 QT Interval:  362 QTC Calculation: 472 R Axis:   90 Text Interpretation:  Sinus tachycardia Borderline right axis deviation No old tracing to compare Confirmed by Abimbola Aki  MD-J, Lindia Garms [phone]([id number]) on 04/06/2016 1:02:51 PM       Radiology Dg Chest 2 View  Result Date: 04/06/2016 CLINICAL DATA:  Dizziness and weakness. History of pneumonia, asthma -[facility] OPD, current smoker. EXAM: CHEST  2 VIEW COMPARISON:  Portable chest x-ray of January 11, 2016 and July 25, 2015. FINDINGS: The lungs are hyperinflated but clear. The heart and pulmonary vascularity  are normal. The mediastinum is normal in width. There is calcification in the wall of the thoracic aorta. There is old wedge compression of the bodies of T6 and T7. There are old deformities of the anterior aspects of the left sixth and seventh ribs. IMPRESSION: COPD. No pneumonia, CHF, nor other acute cardiopulmonary abnormality. Thoracic aortic atherosclerosis. Electronically Signed   By: David  SwazilandJordan M.D.   On: 04/06/2016 12:47   Ct Head Wo Contrast  Result Date: 04/06/2016 CLINICAL DATA:  70 year old female with lightheadedness, dizziness onset today. Near syncope. Initial encounter. EXAM: CT HEAD WITHOUT CONTRAST TECHNIQUE: Contiguous axial images were obtained from the base of the skull through the vertex without intravenous contrast. COMPARISON:  None. FINDINGS: Brain: Circumscribed 12 mm dilated perivascular space at the inferior right basal ganglia (normal variant). Cerebral volume is within normal limits for age. No midline shift, ventriculomegaly, mass effect, evidence of mass lesion, intracranial hemorrhage or evidence of cortically based acute infarction. Gray-white matter differentiation is within normal limits throughout the brain. No cortical encephalomalacia identified. Vascular: Calcified atherosclerosis at the skull base. No suspicious intracranial vascular hyperdensity. Skull: Osteopenia.  No osseous abnormality identified. Sinuses/Orbits: Minimal to mild posterior ethmoid sinus mucosal thickening. Other Visualized paranasal sinuses and mastoids are clear. Other: Visualized orbit soft tissues are within normal limits. Visualized scalp soft tissues are within normal limits. IMPRESSION: Normal for age non contrast CT appearance of the brain. Electronically Signed   By: Odessa FlemingH  Hall M.D.   On: 04/06/2016 12:52    Procedures Procedures (including critical care time)  Medications Ordered in ED Medications  potassium chloride (KLOR-CON) packet 20 mEq (not administered)     Initial  Impression / Assessment and Plan / ED Course  I have reviewed the triage vital signs and the nursing notes.  Pertinent labs & imaging results that were available during my care of the patient were reviewed by me and considered in my medical decision making (see chart for details).  Clinical Course as of Apr 06 1321  Wed Apr 06, 2016  1319 Mild hypokalemia otherwise labs reassuring.  CT head and CXR without acute changes.  [JK]    Clinical Course User Index [JK] Leslie DibblesJon Jermeka Schlotterbeck, MD   ED evaluation is  reassuring.  No focal neuro sx on exam to suggest stroke.  Labs, and xrays are reassuring.  EKG without acute changes . No clear etiology for her headache and fatigue but could be related to her recovering from her recent illness.  At this time there does not appear to be any evidence of an acute emergency medical condition and the patient appears stable for discharge with appropriate outpatient follow up.   Final Clinical Impressions(s) / ED Diagnoses   Final diagnoses:  Hypokalemia  Fatigue, unspecified type    New Prescriptions New Prescriptions   No medications on file   I personally performed the services described in this documentation, which was scribed in my presence.  The recorded information has been reviewed and is accurate.    Leslie Dibbles, MD 04/06/16 [phone]

## 2016-04-06 NOTE — ED Triage Notes (Signed)
Pt comes in by EMS from home for weakness starting this morning. [facility] and was given antibiotic for a lung infection. Pt has hx of COPD. NAD noted. Pt alert and oriented upon arrival.

## 2016-04-06 NOTE — ED Notes (Signed)
Pt taken to xray 

## 2016-04-06 NOTE — Discharge Instructions (Signed)
Continue your current medications. Follow-up with your doctor next week to be rechecked. Return as needed for worsening symptoms

## 2016-04-06 NOTE — ED Triage Notes (Signed)
Pt states she has a headache.   CBG 162.

## 2016-10-04 ENCOUNTER — Encounter: Payer: Self-pay | Admitting: Pulmonary Disease

## 2016-10-04 ENCOUNTER — Ambulatory Visit: Payer: Medicare Other | Admitting: Pulmonary Disease

## 2016-10-04 ENCOUNTER — Ambulatory Visit (INDEPENDENT_AMBULATORY_CARE_PROVIDER_SITE_OTHER): Payer: Medicare Other | Admitting: Pulmonary Disease

## 2016-10-04 VITALS — BP 132/72 | HR 95 | Temp 98.6°F | Ht 63.0 in | Wt 121.1 lb

## 2016-10-04 DIAGNOSIS — J9611 Chronic respiratory failure with hypoxia: Secondary | ICD-10-CM

## 2016-10-04 DIAGNOSIS — Z87891 Personal history of nicotine dependence: Secondary | ICD-10-CM | POA: Insufficient documentation

## 2016-10-04 DIAGNOSIS — R5383 Other fatigue: Secondary | ICD-10-CM | POA: Diagnosis not present

## 2016-10-04 DIAGNOSIS — J449 Chronic obstructive pulmonary disease, unspecified: Secondary | ICD-10-CM | POA: Diagnosis not present

## 2016-10-04 DIAGNOSIS — J441 Chronic obstructive pulmonary disease with (acute) exacerbation: Secondary | ICD-10-CM | POA: Diagnosis not present

## 2016-10-04 MED ORDER — FLUTTER DEVI: 0 refills | Status: DC

## 2016-10-04 MED ORDER — UMECLIDINIUM BROMIDE 62.5 MCG/INH IN AEPB: 1.0000 | INHALATION_SPRAY | Freq: Every day | RESPIRATORY_TRACT | 11 refills | Status: DC

## 2016-10-04 NOTE — Patient Instructions (Addendum)
Today we updated your med list in our EPIC system...    Continue your current medications the same...  We outlined a breathing regimen designed to treat any reversible component to your COPD & help your breathing by minimizing your symptoms and maximizing your life expectancy...  In this regard>>  Do your DUONEB by NEBULIZER 3 times daily every day (morn, mid-day, eve)...    Follow the NEB treatment w/ the Symbicort160- 2sprays twice daily & the INCRUSE once daily (do all these regularly)  In addition take the Guaifenesin 400mg  tabs 3 times daily w/ extra fluids...  Today we did a follow up spirometry breathing test>>    This showed SEVERE airflow obstruction (severe COPD) & this underscores the NEED to take these meds/ breathing treatments regularly ` You may try the FLUTTER Valve 3 times daily after the nebulizer treatments to aid in mucus clearance...  Today we recheck your CXR & blood work...    We will contact you w/ the results when available...   We will also do a thorough assessment for your Oxygen needs- to see if it's necessary to protect your heart & to see if you meet Medicare criteria...    Today we did a walking test in the office...    We will arrange for an overnight oximetry test to be done at home one night...  Call for any questions...  Let's plan a follow up visit in 19mo, sooner if needed for problems.Marland Kitchen..Marland Kitchen

## 2016-10-04 NOTE — Progress Notes (Signed)
Subjective:     Patient ID: Leslie Porter, female   DOB: 03-21-1946, 71 y.o.   MRN: [id number]  HPI  71 y/o WF, ex-smoker who says she quit smoking 12/2015, w/ severe COPD/ emphysema, hx acute on chronic resp failure  ~  June 24, 2015:  Initial pulmonary consult by SN>    89 y/o WF referred by Leslie Porter, Leslie Porter, for a pulmonary evaluation due to COPD>      Leslie Porter has been a smoker- starting at age 60, smoked for 55+years now, up to 1+ppd, and most recently down to 1/2ppd for the past few yrs (this adds up to a 60+ pack-year smoking history);  She denies prev history of lung problems- no asthma as a child, rare bout of bronchitis requiring antibiotics in adulthood but no pneumonia, no TB or known exposure, and no known toxic exposures occupationally or otherwise (she still does part-time merchandizing/ delivery);  Leslie Porter has noticed SOB for several years along w/ a morning cough, beige sputum, and DOE after min activity and some ADLs (eg-stairs);  She was diagnosed w/ COPD and placed on NEBS and inhalers (currently Symbicort & AlbutHFA).Marland KitchenMarland Kitchen      I was able to review old data in Care Everywhere>  COPD, smoker, hx sinusitis (treated 01/2015), 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     EXAM shows Afeb, VSS, O2sat=92% on RA at rest;  HEENT- neg, mallampati2;  Chest- decr BS bilat w/o w/r/r heard;  Heart- RR w/o m/r/g;  Abd- soft, nontender, neg;  Ext- neg w/o c/c/e;  Neuro- intact...   CXR 06/09/15 at [facility]>  Norm heart size, hyperinflation/ clear lungs/ no nodules, adenopathy, etc/ stable anterior wedging of mid-thoracic vertebral bodies...   Spirometry 06/24/15>  FVC=1.90 (70%), FEV1=0.76 (36%), %1sec=40, mid-flows reduced at 18% predicted;  This is  shows a severe obstructive ventilatory defect c/w GOLD Stage 3 COPD  Ambulatory Oximetry 06/24/15>  O2sat=96% on RA at rest w/ pulse=78/min;  She ambulated in office a total of 3 laps w/ lowest O2sat=89% w/ pulse=102/min...  IMP/PLAN >>    COPD-- suspect emphysema> chronic bronchitis, could eval further w/ FullPFTs & check lung volumes and DLCO, consider CT Chest as well (would serve for further eval for centrilobular emphysema as well as screening for high risk smoker);  In the meanwhile we need to add a long acting anticholinergic (INCRUSE one inhalation daily) to her ICS/LABA (Symbicort160-2spBid) and NEB w/ Duoneb Tid...    Cigarette smoker-- Leslie Porter understands that the MOST IMPORTANT thing to do is QUIT SMOKING COMPLETELY; I offered Chantix, encouraged Nicotine replacement options, etc...    Osteoporosis w/ kyphosis & wedging of mult Tspine vertebral bodies-- I encouraged her to get a baseline BMD & proceed on w/ treatment for this progressive & sometimes very painful, condition...     Other medical issues per Leslie Porter & his medical team-- she needs to be sure she is up-to-date on all needed vaccinations (including Pneumovax-23 & Prevnar-13)...   ~  October 04, 2016:  15 month ROV & Leslie Porter had cancelled several f/u appt w/ [place], sent back by Leslie Porter for pulm re-consult>  When I saw her 3/17 we started Rx w/ DUONEB Tid, Symbicort160-2spBid, Incruse once daily, plus smoking cessation counseling & Mucinex600mg -2Bid w/ fluids; unfortunately  she did not return as requested cancelling mult appt in the interval... We reviewed the following medical problems & interval medical history during today's office visit >>     GOLD Stage 3 COPD- clinically a mix of chronic bronchitis & emphysema per our prelim evaluation    Hosp 12/2015 at [facility] w/ acute resp failure due to COPD exac + RML pneumonia w/ superimposed hypoxemia and hypercarbia=> required intubation x10d, treated w/ NEBS, antibiotics, solumedrol, etc; she  refused BiPAP therapy after extubation; she was disch home w/ palliative care/ hospice on Home O2, NEBS w/ Duoneb Tid, Symbicort160-2spBid, Incruse once daily, Prednisone taper, Mucinex, NicodermCQ => followed by Leslie Porter...    Hosp 02/2016 for 3d at [facility] w/ COPD exac & acute on chronic resp failure w/ hypoxemia;  Treated w/ similar inhaled & IV protocol and disch on Home O2 (they documented that O2sats droped to 83% w/ ambulation prior to disch), Medrol dosepak, Doxycycline, NEB w/ Duoneb prn, Symbicort160-2spBid, Mucinex, and encouraged to quit vaping    Cigarette smoker- she says she finally quit cigarettes 12/2015 but she still "vapes"    MEDICAL Issues>  PCP=Leslie Porter; atherosclerosis (needs ASA81), osteoporosis w/ kyphosis & wedging of mult Tspine vertebral bodies (on Aldendronate, calcium, vitD... Her most recent medical visit was 09/30/16 w/ PA- Leslie Porter at [facility]>  Pt c/o SOB w/ activities, cough w/ small amt clear sput, using O2 Qhs & prn days, SHE VAPES REGULARLY THRU THE DAY "only a few puffs at a time" & she wants to know if she has "end-stage COPD"    EXAM shows Afeb, VSS, O2sat=94% on RA at rest;  HEENT- neg, mallampati2;  Chest- decr BS bilat w/o w/r/r heard;  Heart- RR w/o m/r/g;  Abd- soft, nontender, neg;  Ext- neg w/o c/c/e;  Neuro- intact...   CT Angio Chest 12/31/15 showed NEG for PE, atherosclerosis in Ao & coronaries, no aneurysm or dissection, +emphysema & RML consolidation w/o centrally obstructing lesion identified...  LAST CXR 04/06/16 showed norm heart size, atherosclerosis in Ao, COPD- NAD, osteopenia & compression deformities of T6 & T7  Spirometry 10/04/16>  FVC=1.24 (44%), FEV1=0.66 (31%), FEV1/FVC ratio=53%; mid-flows reduced at 22% predicted... This is c/w severe airflow obstruction and GOLD Stage 3-4 COPD (her FEV1 has dropped ~15%...   Ambulatory Oximetry 10/04/16 on RA>  O2sat=94% on RA at rest w/ pulse=101/min;  She ambulated 3  Laps in the office (185' ea) w/ lowest O2sat=87% w/ pulse= 106/min...  Overnight Oximetry 10/11/16 on RA>  Done by [employer]- 4H overnight study showed that she spent w/ O2sat<89%, min O2sat=77% => she needs Nocturnal Oxygen at 2L/min [place] Qhs...  IMP/PLAN>>  Annasofia has deteriorated and her COPD progressed in the year long interval;  Spirometry reveals GOLD Stage 3=>4 COPD and I told her she is approaching "end-stage" but has an opportunity to turn things around & improve some if she will do the following:  1) must quit all smoking & vaping  2) wear her Home O2 at 2L/min flow Qhs and prn days (she wants to hold-off on port O2 system as long as poss)  3) use NEBS w/ DUONEB medication Tid regularly  4) follow these treatments w/ Symbicort160-2spBid & INCRUSE one inhalation daily  5) take the OTC GUAIFENESIN 400mg  tabs at 2tabsTid w/ fluids  6) EXERCISE (pulm rehab) is the next most important treatment to help restore her sense of well-being & hold-back the "end-stage" diagnosis  7) she is off Pred at present, needs  rov recheck in 70month... Note: >50% of this rov was spent in counseling & coordination of care...    Past Medical History:  Diagnosis Date  . Asthma   . COPD (chronic obstructive pulmonary disease) (HCC)   . Osteoporosis     Past Surgical History:  Procedure Laterality Date  . VAGINAL HYSTERECTOMY      Outpatient Encounter Prescriptions as of 10/04/2016  Medication Sig  . albuterol (PROVENTIL HFA;VENTOLIN HFA) 108 (90 Base) MCG/ACT inhaler Inhale 1-2 puffs into the lungs every 6 (six) hours as needed for wheezing or shortness of breath.  Marland Kitchen alendronate (FOSAMAX) 70 MG tablet Take 70 mg by mouth every Sunday.   . budesonide-formoterol (SYMBICORT) 160-4.5 MCG/ACT inhaler Inhale 2 puffs into the lungs 2 (two) times daily.  . Calcium Carbonate (CALCIUM 600 PO) Take 1 tablet by mouth daily.  Marland Kitchen docusate sodium (COLACE) 100 MG capsule Take 1 capsule (100 mg total) by mouth  2 (two) times daily as needed for mild constipation.  Marland Kitchen guaiFENesin (MUCINEX) 600 MG 12 hr tablet Take 1 tablet (600 mg total) by mouth 2 (two) times daily as needed for cough or to loosen phlegm.  Marland Kitchen ipratropium-albuterol (DUONEB) 0.5-2.5 (3) MG/3ML SOLN Take 3 mLs by nebulization 2 (two) times daily.  . Multiple Vitamin (MULTIVITAMIN) capsule Take 1 capsule by mouth daily.  . [DISCONTINUED] EPINEPHrine (EPIPEN 2-PAK) 0.3 mg/0.3 mL IJ SOAJ injection Inject 0.3 mLs (0.3 mg total) into the muscle once. (Patient not taking: Reported on 10/04/2016)  . [DISCONTINUED] food thickener (THICK IT) POWD Use as needed (Patient not taking: Reported on 10/04/2016)  . [DISCONTINUED] nicotine (NICODERM CQ - DOSED IN MG/24 HOURS) 21 mg/24hr patch Place 1 patch (21 mg total) onto the skin daily. (Patient not taking: Reported on 10/04/2016)  . [DISCONTINUED] predniSONE (DELTASONE) 10 MG tablet Take 40mg  (4 tabs) x 2 days, then taper to 30mg  (3 tabs) x 2 days, then 20mg  (2 tabs) x 2 days, then 10mg  (1 tab) x 2days (Patient not taking: Reported on 10/04/2016)  . [DISCONTINUED] umeclidinium bromide (INCRUSE ELLIPTA) 62.5 MCG/INH AEPB Inhale 1 puff into the lungs daily. (Patient not taking: Reported on 10/04/2016)  . [DISCONTINUED] vitamin E 400 UNIT capsule Take 400 Units by mouth daily.    ALLERGIES>  No known drug allergies...   Immunization History  Administered Date(s) Administered  . Influenza,inj,Quad PF,36+ Mos 02/03/2015  . Zoster 02/27/2014    Family History  Problem Relation Age of Onset  . Heart attack Father   . Heart disease Mother     Social History   Social History  . Marital status: Divorced    Spouse name: N/A  . Number of children: N/A  . Years of education: N/A   Occupational History  . merchandising     [employer]   Social History Main Topics  . Smoking status: Former Smoker    Years: 55.00    Types: Cigarettes    Quit date: 12/25/2015  . Smokeless tobacco: Never Used   . Alcohol use 0.0 oz/week     Comment: Occasional  . Drug use: No  . Sexual activity: Not on file   Other Topics Concern  . Not on file   Social History Narrative  . No narrative on file    Current Medications, Allergies, Past Medical History, Past Surgical History, Family History, and Social History were reviewed in [employer] record.   Review of Systems  All symptoms NEG except where BOLDED >>  Constitutional:  F/C/S, fatigue, anorexia, unexpected weight change. HEENT:  HA, visual changes, hearing loss, earache, nasal symptoms, sore throat, mouth sores, hoarseness. Resp:  cough, sputum, hemoptysis; SOB, tightness, wheezing. Cardio:  CP, palpit, DOE, orthopnea, edema. GI:  N/V/D/C, blood in stool; reflux, abd pain, distention, gas. GU:  dysuria, freq, urgency, hematuria, flank pain, voiding difficulty. MS:  joint pain, swelling, tenderness, decr ROM; neck pain, back pain, etc. Neuro:  HA, tremors, seizures, dizziness, syncope, weakness, numbness, gait abn. Skin:  suspicious lesions or skin rash. Heme:  adenopathy, bruising, bleeding. Psyche:  confusion, agitation, sleep disturbance, hallucinations, anxiety, depression suicidal.   Objective:   Physical Exam        Vital Signs:  Reviewed...   General:  WD, WN, 71 y/o WF in NAD; alert & oriented; pleasant & cooperative... HEENT:  [place]/AT; Conjunctiva- pink, Sclera- nonicteric, EOM-wnl, PERRLA, EACs-clear, TMs-wnl; NOSE-clear; THROAT-clear & wnl.  Neck:  Supple w/ fair ROM; no JVD; normal carotid impulses w/o bruits; no thyromegaly or nodules palpated; no lymphadenopathy.  Chest:  Decr BS bilat & can't augment the BS voluntarily; no wheezing/ rales/ or rhonchi today... Heart:  Regular Rhythm; norm S1 & S2 without murmurs, rubs, or gallops detected. Abdomen:  Soft & nontender- no guarding or rebound; normal bowel sounds; no organomegaly or masses palpated. Ext:  Normal ROM; without  deformities, +arthritic changes; no varicose veins, +venous insuffic, no edema;  Pulses intact w/o bruits. Neuro:  CNs II-XII intact; motor testing normal; sensory testing normal; gait normal & balance OK. Derm:  No lesions noted; no rash etc. Lymph:  No cervical, supraclavicular, axillary, or inguinal adenopathy palpated.   Assessment:      IMP >>    COPD-- suspect emphysema> chronic bronchitis    Hx acute on chronic hypoxemic & hypercarbic resp failure    Ex- Cigarette smoker-- still vapes    Osteoporosis w/ wedging of mult Tspine vertebral bodies and kyphosis--     Other medical issues per Leslie Porter & his medical team-- she needs to be sure she is up-to-date on all needed vaccinations (including Pneumovax23 & Prevnar-13)...  PLAN >>  10/04/16>    Elease Hashimotoatricia has deteriorated and her COPD progressed in the year long interval;  Spirometry reveals GOLD Stage 3=>4 COPD and I told her she is approaching "end-stage" but has an opportunity to turn things around & improve some if she will do the following:  1) must quit all smoking & vaping  2) wear her Home O2 at 2L/min flow Qhs and prn days (she wants to hold-off on port O2 system as long as poss)  3) use NEBS w/ DUONEB medication Tid regularly  4) follow these treatments w/ Symbicort160-2spBid & INCRUSE one inhalation daily  5) take the OTC GUAIFENESIN 400mg  tabs at 2tabsTid w/ fluids  6) EXERCISE (pulm rehab) is the next most important treatment to help restore her sense of well-being & hold-back the "end-stage" diagnosis  7) she is off Pred at present, needs rov recheck in 67month...     Plan:     Patient's Medications  New Prescriptions   RESPIRATORY THERAPY SUPPLIES (FLUTTER) DEVI    Use device as directed  Previous Medications   ALBUTEROL (PROVENTIL HFA;VENTOLIN HFA) 108 (90 BASE) MCG/ACT INHALER    Inhale 1-2 puffs into the lungs every 6 (six) hours as needed for wheezing or shortness of breath.   ALENDRONATE (FOSAMAX) 70 MG TABLET     Take 70 mg by  mouth every Sunday.    BUDESONIDE-FORMOTEROL (SYMBICORT) 160-4.5 MCG/ACT INHALER    Inhale 2 puffs into the lungs 2 (two) times daily.   CALCIUM CARBONATE (CALCIUM 600 PO)    Take 1 tablet by mouth daily.   DOCUSATE SODIUM (COLACE) 100 MG CAPSULE    Take 1 capsule (100 mg total) by mouth 2 (two) times daily as needed for mild constipation.   GUAIFENESIN (MUCINEX) 600 MG 12 HR TABLET    Take 1 tablet (600 mg total) by mouth 2 (two) times daily as needed for cough or to loosen phlegm.   IPRATROPIUM-ALBUTEROL (DUONEB) 0.5-2.5 (3) MG/3ML SOLN    Take 3 mLs by nebulization 2 (two) times daily.   MULTIPLE VITAMIN (MULTIVITAMIN) CAPSULE    Take 1 capsule by mouth daily.  Modified Medications   Modified Medication Previous Medication   UMECLIDINIUM BROMIDE (INCRUSE ELLIPTA) 62.5 MCG/INH AEPB umeclidinium bromide (INCRUSE ELLIPTA) 62.5 MCG/INH AEPB      Inhale 1 puff into the lungs daily.    Inhale 1 puff into the lungs daily.  Discontinued Medications   EPINEPHRINE (EPIPEN 2-PAK) 0.3 MG/0.3 ML IJ SOAJ INJECTION    Inject 0.3 mLs (0.3 mg total) into the muscle once.   FOOD THICKENER (THICK IT) POWD    Use as needed   NICOTINE (NICODERM CQ - DOSED IN MG/24 HOURS) 21 MG/24HR PATCH    Place 1 patch (21 mg total) onto the skin daily.   PREDNISONE (DELTASONE) 10 MG TABLET    Take 40mg  (4 tabs) x 2 days, then taper to 30mg  (3 tabs) x 2 days, then 20mg  (2 tabs) x 2 days, then 10mg  (1 tab) x 2days   VITAMIN E 400 UNIT CAPSULE    Take 400 Units by mouth daily.

## 2016-10-27 ENCOUNTER — Telehealth: Payer: Self-pay | Admitting: Pulmonary Disease

## 2016-10-27 NOTE — Telephone Encounter (Signed)
Per SN---  ONO results shows that the oxygen data was at less than 88% for 122 minutes.  She needs the nocturnal oxygen at 2 liters at bedtime.  We need to make sure that the pt has this set up and pt had an appt on 7/12 but this has been cancelled.  Will need to reschedule this appt..  Pt stated that she is not sure when she can come in but she will call back to schedule this appt.    Pt stated that [facility] is going to fax over a form for us to fill out for the incruse so she may get this at a cheaper price using the mail order for 3 month supply

## 2016-11-01 ENCOUNTER — Other Ambulatory Visit: Payer: Self-pay | Admitting: Pulmonary Disease

## 2016-11-01 MED ORDER — UMECLIDINIUM BROMIDE 62.5 MCG/INH IN AEPB: 1.0000 | INHALATION_SPRAY | Freq: Every day | RESPIRATORY_TRACT | 3 refills | Status: DC

## 2016-11-03 ENCOUNTER — Ambulatory Visit: Payer: Medicare Other | Admitting: Pulmonary Disease

## 2017-03-16 ENCOUNTER — Encounter (HOSPITAL_COMMUNITY): Payer: Self-pay | Admitting: *Deleted

## 2017-03-16 ENCOUNTER — Emergency Department (HOSPITAL_COMMUNITY): Payer: Medicare Other

## 2017-03-16 ENCOUNTER — Inpatient Hospital Stay (HOSPITAL_COMMUNITY)
Admission: EM | Admit: 2017-03-16 | Discharge: 2017-03-18 | DRG: 190 | Disposition: A | Discharge status: Home / Self Care | Payer: Medicare Other | Attending: Internal Medicine | Admitting: Internal Medicine

## 2017-03-16 ENCOUNTER — Other Ambulatory Visit: Payer: Self-pay

## 2017-03-16 DIAGNOSIS — K5900 Constipation, unspecified: Secondary | ICD-10-CM | POA: Diagnosis present

## 2017-03-16 DIAGNOSIS — J9621 Acute and chronic respiratory failure with hypoxia: Secondary | ICD-10-CM | POA: Diagnosis present

## 2017-03-16 DIAGNOSIS — J441 Chronic obstructive pulmonary disease with (acute) exacerbation: Principal | ICD-10-CM | POA: Diagnosis present

## 2017-03-16 DIAGNOSIS — I519 Heart disease, unspecified: Secondary | ICD-10-CM

## 2017-03-16 DIAGNOSIS — Z720 Tobacco use: Secondary | ICD-10-CM

## 2017-03-16 DIAGNOSIS — J9622 Acute and chronic respiratory failure with hypercapnia: Secondary | ICD-10-CM | POA: Diagnosis not present

## 2017-03-16 DIAGNOSIS — R0902 Hypoxemia: Secondary | ICD-10-CM | POA: Diagnosis not present

## 2017-03-16 DIAGNOSIS — Z90710 Acquired absence of both cervix and uterus: Secondary | ICD-10-CM

## 2017-03-16 DIAGNOSIS — Z9981 Dependence on supplemental oxygen: Secondary | ICD-10-CM

## 2017-03-16 DIAGNOSIS — J9601 Acute respiratory failure with hypoxia: Secondary | ICD-10-CM

## 2017-03-16 DIAGNOSIS — Z7951 Long term (current) use of inhaled steroids: Secondary | ICD-10-CM

## 2017-03-16 DIAGNOSIS — J9620 Acute and chronic respiratory failure, unspecified whether with hypoxia or hypercapnia: Secondary | ICD-10-CM | POA: Diagnosis not present

## 2017-03-16 DIAGNOSIS — M810 Age-related osteoporosis without current pathological fracture: Secondary | ICD-10-CM | POA: Diagnosis present

## 2017-03-16 DIAGNOSIS — Z66 Do not resuscitate: Secondary | ICD-10-CM | POA: Diagnosis present

## 2017-03-16 DIAGNOSIS — Z8249 Family history of ischemic heart disease and other diseases of the circulatory system: Secondary | ICD-10-CM

## 2017-03-16 DIAGNOSIS — R0609 Other forms of dyspnea: Secondary | ICD-10-CM | POA: Diagnosis not present

## 2017-03-16 DIAGNOSIS — J9600 Acute respiratory failure, unspecified whether with hypoxia or hypercapnia: Secondary | ICD-10-CM

## 2017-03-16 DIAGNOSIS — D72828 Other elevated white blood cell count: Secondary | ICD-10-CM | POA: Diagnosis present

## 2017-03-16 DIAGNOSIS — T380X5A Adverse effect of glucocorticoids and synthetic analogues, initial encounter: Secondary | ICD-10-CM | POA: Diagnosis present

## 2017-03-16 DIAGNOSIS — Z87891 Personal history of nicotine dependence: Secondary | ICD-10-CM

## 2017-03-16 DIAGNOSIS — R718 Other abnormality of red blood cells: Secondary | ICD-10-CM | POA: Diagnosis present

## 2017-03-16 DIAGNOSIS — I5189 Other ill-defined heart diseases: Secondary | ICD-10-CM | POA: Diagnosis present

## 2017-03-16 DIAGNOSIS — R0989 Other specified symptoms and signs involving the circulatory and respiratory systems: Secondary | ICD-10-CM | POA: Diagnosis not present

## 2017-03-16 DIAGNOSIS — R0602 Shortness of breath: Secondary | ICD-10-CM | POA: Diagnosis present

## 2017-03-16 HISTORY — DX: Other ill-defined heart diseases: I51.89

## 2017-03-16 LAB — BLOOD GAS, ARTERIAL
ACID-BASE EXCESS: 1.8 mmol/L (ref 0.0–2.0)
BICARBONATE: 25.3 mmol/L (ref 20.0–28.0)
DRAWN BY: 28459
FIO2: 40
O2 SAT: 93.1 %
PH ART: 7.366 (ref 7.350–7.450)
Patient temperature: 37
pCO2 arterial: 47.6 mmHg (ref 32.0–48.0)
pO2, Arterial: 71.3 mmHg — ABNORMAL LOW (ref 83.0–108.0)

## 2017-03-16 LAB — BASIC METABOLIC PANEL
ANION GAP: 8 (ref 5–15)
BUN: 17 mg/dL (ref 6–20)
CALCIUM: 9.2 mg/dL (ref 8.9–10.3)
CO2: 28 mmol/L (ref 22–32)
Chloride: 102 mmol/L (ref 101–111)
Creatinine, Ser: 0.53 mg/dL (ref 0.44–1.00)
Glucose, Bld: 103 mg/dL — ABNORMAL HIGH (ref 65–99)
POTASSIUM: 3.6 mmol/L (ref 3.5–5.1)
SODIUM: 138 mmol/L (ref 135–145)

## 2017-03-16 LAB — PHOSPHORUS: Phosphorus: 3.6 mg/dL (ref 2.5–4.6)

## 2017-03-16 LAB — BRAIN NATRIURETIC PEPTIDE: B NATRIURETIC PEPTIDE 5: 159 pg/mL — AB (ref 0.0–100.0)

## 2017-03-16 LAB — VITAMIN B12: Vitamin B-12: 485 pg/mL (ref 180–914)

## 2017-03-16 LAB — CBC
HEMATOCRIT: 42.7 % (ref 36.0–46.0)
HEMOGLOBIN: 14 g/dL (ref 12.0–15.0)
MCH: 34.2 pg — ABNORMAL HIGH (ref 26.0–34.0)
MCHC: 32.8 g/dL (ref 30.0–36.0)
MCV: 104.4 fL — ABNORMAL HIGH (ref 78.0–100.0)
Platelets: 290 10*3/uL (ref 150–400)
RBC: 4.09 MIL/uL (ref 3.87–5.11)
RDW: 12.6 % (ref 11.5–15.5)
WBC: 13 10*3/uL — AB (ref 4.0–10.5)

## 2017-03-16 LAB — TROPONIN I: Troponin I: <0.03 ng/mL (ref <0.03)

## 2017-03-16 LAB — FOLATE: Folate: 34 ng/mL (ref >5.9)

## 2017-03-16 LAB — MAGNESIUM: Magnesium: 2 mg/dL (ref 1.7–2.4)

## 2017-03-16 MED ORDER — GUAIFENESIN ER 600 MG PO TB12: 600.0000 mg | ORAL_TABLET | Freq: Two times a day (BID) | ORAL | Status: DC | PRN

## 2017-03-16 MED ORDER — METHYLPREDNISOLONE SODIUM SUCC 125 MG IJ SOLR
60.0000 mg | Freq: Four times a day (QID) | INTRAMUSCULAR | Status: AC
  Administered 2017-03-16 (×3): 60 mg via INTRAVENOUS
  Filled 2017-03-16 (×2): qty 2

## 2017-03-16 MED ORDER — LORAZEPAM 2 MG/ML IJ SOLN: 0.5000 mg | INTRAMUSCULAR | Status: DC | PRN

## 2017-03-16 MED ORDER — METHYLPREDNISOLONE SODIUM SUCC 40 MG IJ SOLR
40.0000 mg | Freq: Four times a day (QID) | INTRAMUSCULAR | Status: DC
  Administered 2017-03-16: 40 mg via INTRAVENOUS
  Filled 2017-03-16 (×2): qty 1

## 2017-03-16 MED ORDER — ENOXAPARIN SODIUM 40 MG/0.4ML ~~LOC~~ SOLN
40.0000 mg | SUBCUTANEOUS | Status: DC
  Administered 2017-03-16 – 2017-03-18 (×3): 40 mg via SUBCUTANEOUS
  Filled 2017-03-16 (×3): qty 0.4

## 2017-03-16 MED ORDER — ONDANSETRON HCL 4 MG/2ML IJ SOLN: 4.0000 mg | Freq: Four times a day (QID) | INTRAMUSCULAR | Status: DC | PRN

## 2017-03-16 MED ORDER — ALBUTEROL (5 MG/ML) CONTINUOUS INHALATION SOLN
15.0000 mg/h | INHALATION_SOLUTION | Freq: Once | RESPIRATORY_TRACT | Status: AC
  Administered 2017-03-16: 15 mg/h via RESPIRATORY_TRACT
  Filled 2017-03-16: qty 20

## 2017-03-16 MED ORDER — ALBUTEROL SULFATE (2.5 MG/3ML) 0.083% IN NEBU
5.0000 mg | INHALATION_SOLUTION | Freq: Once | RESPIRATORY_TRACT | Status: AC
  Administered 2017-03-16: 5 mg via RESPIRATORY_TRACT
  Filled 2017-03-16: qty 6

## 2017-03-16 MED ORDER — MAGNESIUM SULFATE 2 GM/50ML IV SOLN
2.0000 g | Freq: Once | INTRAVENOUS | Status: AC
  Administered 2017-03-16: 2 g via INTRAVENOUS
  Filled 2017-03-16: qty 50

## 2017-03-16 MED ORDER — ACETAMINOPHEN 325 MG PO TABS
650.0000 mg | ORAL_TABLET | Freq: Four times a day (QID) | ORAL | Status: DC | PRN
  Administered 2017-03-16: 650 mg via ORAL
  Filled 2017-03-16: qty 2

## 2017-03-16 MED ORDER — DOCUSATE SODIUM 100 MG PO CAPS: 100.0000 mg | ORAL_CAPSULE | Freq: Two times a day (BID) | ORAL | Status: DC | PRN

## 2017-03-16 MED ORDER — BUDESONIDE 0.25 MG/2ML IN SUSP
0.2500 mg | Freq: Two times a day (BID) | RESPIRATORY_TRACT | Status: DC
  Administered 2017-03-16 – 2017-03-17 (×3): 0.25 mg via RESPIRATORY_TRACT
  Filled 2017-03-16 (×3): qty 2

## 2017-03-16 MED ORDER — IPRATROPIUM-ALBUTEROL 0.5-2.5 (3) MG/3ML IN SOLN
3.0000 mL | Freq: Four times a day (QID) | RESPIRATORY_TRACT | Status: DC
  Administered 2017-03-16 – 2017-03-18 (×7): 3 mL via RESPIRATORY_TRACT
  Filled 2017-03-16 (×8): qty 3

## 2017-03-16 MED ORDER — ONDANSETRON HCL 4 MG PO TABS: 4.0000 mg | ORAL_TABLET | Freq: Four times a day (QID) | ORAL | Status: DC | PRN

## 2017-03-16 MED ORDER — IPRATROPIUM BROMIDE 0.02 % IN SOLN
0.5000 mg | Freq: Once | RESPIRATORY_TRACT | Status: AC
  Administered 2017-03-16: 0.5 mg via RESPIRATORY_TRACT
  Filled 2017-03-16: qty 2.5

## 2017-03-16 NOTE — H&P (Signed)
History and Physical    Leslie Porter XLK:[id number] DOB: 04/28/1945 DOA: 03/16/2017  PCP: Eartha InchBadger, Michael C, MD   Patient coming from: Home.  I have personally briefly reviewed patient's old medical records in [facility]  Chief Complaint: Shortness of breath.  HPI: Leslie Porter is a 71 y.o. female with medical history significant of asthma, COPD on home oxygen, osteoporosis who is brought to the emergency department via EMS after having progressively worse dyspnea associated with occasionally productive cough of whitish sputum, wheezing and fatigue despite using nebulizer treatments 5 times a day for the past few days.  EMS reported that when they arrived to the scene the patient was satting 85% on 2 L oxygen.  She was given nonrebreather oxygen, bronchodilators and Solu-Medrol in route to the hospital.  She denies fever, chills, sore throat, chest pain, dizziness, palpitations, diaphoresis, pitting edema lower extremities, abdominal pain, nausea, vomiting, diarrhea, melena or hematochezia.  She is states that she experiences frequent constipation.  She denies dysuria, frequency or hematuria.  Denies polyuria, polydipsia, polyphagia or blurred vision.  ED Course: Initial vital signs in the ED temperature 98.48F, pulse 124, respirations 28, blood pressure 149/92 mmHg and O2 sat was 98% on supplemental oxygen.  She was put on BiPAP ventilation and was given a continuous neb treatment of 5 mg, plus ipratropium 0.5 mg, followed by a 15 mg of albuterol with some improvement.  Her workup shows a WBC count of 13, hemoglobin of 14 g/dL and platelets [id number].  Her MCV was elevated at 104.4 fL.  Review of Systems: As per HPI otherwise 10 point review of systems negative.    Past Medical History:  Diagnosis Date  . Asthma   . COPD (chronic obstructive pulmonary disease) (HCC)   . Osteoporosis     Past Surgical History:  Procedure Laterality Date  . VAGINAL HYSTERECTOMY       reports  that she quit smoking about 14 months ago. Her smoking use included cigarettes. She quit after 55.00 years of use. she has never used smokeless tobacco. She reports that she drinks alcohol. She reports that she does not use drugs.  Allergies  Allergen Reactions  . Levofloxacin Shortness Of Breath    Family History  Problem Relation Age of Onset  . Heart attack Father   . Heart disease Mother     Prior to Admission medications   Medication Sig Start Date End Date Taking? Authorizing Provider  albuterol (PROVENTIL HFA;VENTOLIN HFA) 108 (90 Base) MCG/ACT inhaler Inhale 1-2 puffs into the lungs every 6 (six) hours as needed for wheezing or shortness of breath.    [provider]  alendronate (FOSAMAX) 70 MG tablet Take 70 mg by mouth every Sunday.  12/29/15   [provider]  budesonide-formoterol (SYMBICORT) 160-4.5 MCG/ACT inhaler Inhale 2 puffs into the lungs 2 (two) times daily.    [provider]  Calcium Carbonate (CALCIUM 600 PO) Take 1 tablet by mouth daily.    [provider]  docusate sodium (COLACE) 100 MG capsule Take 1 capsule (100 mg total) by mouth 2 (two) times daily as needed for mild constipation. 01/15/16   Mikhail, Nita SellsMaryann, DO  guaiFENesin (MUCINEX) 600 MG 12 hr tablet Take 1 tablet (600 mg total) by mouth 2 (two) times daily as needed for cough or to loosen phlegm. 01/15/16   Mikhail, Nita SellsMaryann, DO  ipratropium-albuterol (DUONEB) 0.5-2.5 (3) MG/3ML SOLN Take 3 mLs by nebulization 2 (two) times daily.    [provider]  Multiple Vitamin (MULTIVITAMIN) capsule Take 1 capsule by mouth daily.    [provider]  Respiratory Therapy Supplies (FLUTTER) DEVI Use device as directed 10/04/16   Michele McalpineNadel, Scott M, MD  umeclidinium bromide (INCRUSE ELLIPTA) 62.5 MCG/INH AEPB Inhale 1 puff into the lungs daily. 11/01/16   Michele McalpineNadel, Scott M, MD    Physical Exam: Vitals:   03/16/17 0200 03/16/17 0236 03/16/17 0245 03/16/17 0341  BP: (!) 136/92      Pulse: (!) 123   (!) 116  Resp: (!) 30     Temp:    98.1 F (36.7 C)  TempSrc:    Oral  SpO2: 92% 99% 100% 92%  Weight:    53.1 kg (117 lb 1 oz)  Height:    5\' 3"  (1.6 m)    Constitutional: NAD, calm, comfortable Eyes: PERRL, lids and conjunctivae normal ENMT: BiPAP mask in place.  Mucous membranes are dry. Posterior pharynx clear of any exudate or lesions. Neck: normal, supple, no masses, no thyromegaly Respiratory: On BiPAP ventilation with decreased breath sounds, wheezing and mild rhonchi bilaterally.  No accessory muscle use. Cardiovascular: Tachycardic at 112 bpm, no murmurs / rubs / gallops. No extremity edema. 2+ pedal pulses. No carotid bruits.  Abdomen: Soft, no tenderness, no masses palpated. No hepatosplenomegaly. Bowel sounds positive.  Musculoskeletal: no clubbing / cyanosis. No joint deformity upper and lower extremities. Good ROM, no contractures. Normal muscle tone.  Skin: no rashes, lesions, ulcers. No induration Neurologic: CN 2-12 grossly intact. Sensation intact, DTR normal. Strength 5/5 in all 4.  Psychiatric: Normal judgment and insight. Alert and oriented x 4.  Mildly anxious mood.    Labs on Admission: I have personally reviewed following labs and imaging studies  CBC: Recent Labs  Lab 03/16/17 0211  WBC 13.0*  HGB 14.0  HCT 42.7  MCV 104.4*  PLT 290   Basic Metabolic Panel: Recent Labs  Lab 03/16/17 0211  NA 138  K 3.6  CL 102  CO2 28  GLUCOSE 103*  BUN 17  CREATININE 0.53  CALCIUM 9.2   GFR: Estimated Creatinine Clearance: 51.8 mL/min (by C-G formula based on SCr of 0.53 mg/dL). Liver Function Tests: No results for input(s): AST, ALT, ALKPHOS, BILITOT, PROT, ALBUMIN in the last 168 hours. No results for input(s): LIPASE, AMYLASE in the last 168 hours. No results for input(s): AMMONIA in the last 168 hours. Coagulation Profile: No results for input(s): INR, PROTIME in the last 168 hours. Cardiac Enzymes: Recent Labs  Lab  03/16/17 0211  TROPONINI <0.03   BNP (last 3 results) No results for input(s): PROBNP in the last 8760 hours. HbA1C: No results for input(s): HGBA1C in the last 72 hours. CBG: No results for input(s): GLUCAP in the last 168 hours. Lipid Profile: No results for input(s): CHOL, HDL, LDLCALC, TRIG, CHOLHDL, LDLDIRECT in the last 72 hours. Thyroid Function Tests: No results for input(s): TSH, T4TOTAL, FREET4, T3FREE, THYROIDAB in the last 72 hours. Anemia Panel: No results for input(s): VITAMINB12, FOLATE, FERRITIN, TIBC, IRON, RETICCTPCT in the last 72 hours. Urine analysis:    Component Value Date/Time   COLORURINE YELLOW 04/06/2016 1142   APPEARANCEUR CLEAR 04/06/2016 1142   LABSPEC <1.005 (L) 04/06/2016 1142   PHURINE 6.0 04/06/2016 1142   GLUCOSEU NEGATIVE 04/06/2016 1142   HGBUR NEGATIVE 04/06/2016 1142   BILIRUBINUR NEGATIVE 04/06/2016 1142   KETONESUR NEGATIVE 04/06/2016 1142   PROTEINUR NEGATIVE 04/06/2016 1142   NITRITE NEGATIVE 04/06/2016 1142   LEUKOCYTESUR TRACE (A) 04/06/2016 1142  Radiological Exams on Admission: Dg Chest Port 1 View  Result Date: 03/16/2017 CLINICAL DATA:  Shortness of breath since Monday, increasing tonight. History of asthma, COPD, former smoker. EXAM: PORTABLE CHEST 1 VIEW COMPARISON:  04/06/2016 FINDINGS: Normal heart size and pulmonary vascularity. Lungs appear clear and expanded. Emphysematous changes in the lungs with scattered fibrosis and peribronchial thickening suggesting chronic bronchitis. No blunting of costophrenic angles. No pneumothorax. Calcification of the aorta. 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 No evidence of active pulmonary disease. Aortic atherosclerosis. Electronically Signed   By: Burman Nieves M.D.   On: 03/16/2017 02:16   01/02/2016 echocardiogram complete ------------------------------------------------------------------- LV EF: 65% -    70%  ------------------------------------------------------------------- Indications:      COPD 496.  ------------------------------------------------------------------- History:   PMH:   Chronic obstructive pulmonary disease.  ------------------------------------------------------------------- Study Conclusions  - Left ventricle: The cavity size was normal. Wall thickness was   normal. Systolic function was vigorous. The estimated ejection   fraction was in the range of 65% to 70%. Wall motion was normal;   there were no regional wall motion abnormalities. Doppler   parameters are consistent with abnormal left ventricular   relaxation (grade 1 diastolic dysfunction). - Mitral valve: Mildly calcified annulus. - Atrial septum: No defect or patent foramen ovale was identified. - Inferior vena cava: The vessel was dilated. The respirophasic   diameter changes were blunted (< 50%), consistent with elevated   central venous pressure.  EKG: Independently reviewed. Vent. rate 122 BPM PR interval * ms QRS duration 83 ms QT/QTc 323/461 ms P-R-T axes 100 99 70 Right and left arm electrode reversal, interpretation assumes no reversal Sinus tachycardia Right axis deviation  Assessment/Plan Principal Problem:   Acute respiratory failure (HCC)   COPD with acute exacerbation (HCC) Admit to ICU/inpatient. Continue supplemental oxygen. Continue BiPAP ventilation. Continue scheduled and as needed bronchodilators. Continue Solu-Medrol 40 mg every 6 hours x4 doses. Continue guaifenesin 600 mg p.o. twice daily. Check sputum Gram stain, culture and sensitivity if possible.  Active Problems:   Elevated MCV Check B12 and folic acid levels.    Grade 1 diastolic dysfunction No signs of decompensation. Monitor input and output.   DVT prophylaxis: Lovenox SQ. Code Status: DNR. Family Communication:  Disposition Plan: Admit for BiPAP ventilation for acute respiratory failure and COPD  exacerbation treatment. Consults called:  Admission status: Inpatient/ICU.   Bobette Mo MD [facility] Pager [phone].  If 7PM-7AM, please contact night-coverage [email or web address] Password [facility]  03/16/2017, 3:45 AM

## 2017-03-16 NOTE — ED Triage Notes (Signed)
Patient arrives via EMS from home  w/ complaints of shortness of breath. Hx of copd. Wheezing throughout. Initial 02 sats 85%, improved with NRB. Nebs given and 125 solumedrol en route to ED.

## 2017-03-16 NOTE — ED Notes (Signed)
admitting Provider at bedside. 

## 2017-03-16 NOTE — ED Notes (Signed)
ED Provider at bedside. 

## 2017-03-16 NOTE — ED Notes (Signed)
RT notified of orders 

## 2017-03-16 NOTE — ED Provider Notes (Signed)
[facility] EMERGENCY DEPARTMENT Provider Note   CSN: [id number] Arrival date & time: 03/16/17  0143     History   Chief Complaint Chief Complaint  Patient presents with  . Shortness of Breath    HPI Leslie Porter is a 71 y.o. female.  Level 5 caveat for respiratory distress.  Patient arrives via EMS with difficulty breathing, coughing and shortness of breath for the past several days.  He has a history of COPD and wears oxygen at home as needed.  She is given nebulizers and steroids by EMS and placed on nonrebreather.  Initial sat was 85% on 2 L.  The patient states she is not able to cough up anything.  She has been using her nebulizer 5 times a day for the past several days.  She normally uses it 3 times a day.  Denies any chest pain or fever.  No abdominal pain, nausea or vomiting.  No leg pain or leg swelling.  Denies any sick contacts.  No other medical problems.   The history is provided by the patient and the EMS personnel. The history is limited by the condition of the patient.  Shortness of Breath  Associated symptoms include rhinorrhea and cough. Pertinent negatives include no fever, no headaches, no chest pain, no vomiting, no abdominal pain, no rash and no leg swelling.    Past Medical History:  Diagnosis Date  . Asthma   . COPD (chronic obstructive pulmonary disease) (HCC)   . Osteoporosis     Patient Active Problem List   Diagnosis Date Noted  . Ex-cigarette smoker 10/04/2016  . Palliative care encounter   . Goals of care, counseling/discussion   . DNR (do not resuscitate) discussion   . Acute hypoxemic respiratory failure (HCC) 12/31/2015  . COPD with acute exacerbation (HCC) 12/31/2015  . CAP (community acquired pneumonia) 12/31/2015  . Respiratory failure (HCC) 12/31/2015  . COPD mixed type (HCC) 06/24/2015  . Dyspnea 06/24/2015    Past Surgical History:  Procedure Laterality Date  . VAGINAL HYSTERECTOMY      OB History    No data available         Home Medications    Prior to Admission medications   Medication Sig Start Date End Date Taking? Authorizing Provider  albuterol (PROVENTIL HFA;VENTOLIN HFA) 108 (90 Base) MCG/ACT inhaler Inhale 1-2 puffs into the lungs every 6 (six) hours as needed for wheezing or shortness of breath.    [provider]  alendronate (FOSAMAX) 70 MG tablet Take 70 mg by mouth every Sunday.  12/29/15   [provider]  budesonide-formoterol (SYMBICORT) 160-4.5 MCG/ACT inhaler Inhale 2 puffs into the lungs 2 (two) times daily.    [provider]  Calcium Carbonate (CALCIUM 600 PO) Take 1 tablet by mouth daily.    [provider]  docusate sodium (COLACE) 100 MG capsule Take 1 capsule (100 mg total) by mouth 2 (two) times daily as needed for mild constipation. 01/15/16   Mikhail, Nita Sells, DO  guaiFENesin (MUCINEX) 600 MG 12 hr tablet Take 1 tablet (600 mg total) by mouth 2 (two) times daily as needed for cough or to loosen phlegm. 01/15/16   Mikhail, Nita Sells, DO  ipratropium-albuterol (DUONEB) 0.5-2.5 (3) MG/3ML SOLN Take 3 mLs by nebulization 2 (two) times daily.    [provider]  Multiple Vitamin (MULTIVITAMIN) capsule Take 1 capsule by mouth daily.    [provider]  Respiratory Therapy Supplies (FLUTTER) DEVI Use device as directed 10/04/16  Michele McalpineNadel, Scott M, MD  umeclidinium bromide (INCRUSE ELLIPTA) 62.5 MCG/INH AEPB Inhale 1 puff into the lungs daily. 11/01/16   Michele McalpineNadel, Scott M, MD    Family History Family History  Problem Relation Age of Onset  . Heart attack Father   . Heart disease Mother     Social History Social History   Tobacco Use  . Smoking status: Former Smoker    Years: 55.00    Types: Cigarettes    Last attempt to quit: 12/25/2015    Years since quitting: 1.2  . Smokeless tobacco: Never Used  Substance Use Topics  . Alcohol use: Yes    Alcohol/week: 0.0 oz    Comment: Occasional  . Drug use: No     Allergies    Levofloxacin   Review of Systems Review of Systems  Constitutional: Positive for activity change. Negative for appetite change and fever.  HENT: Positive for congestion and rhinorrhea.   Eyes: Negative for visual disturbance.  Respiratory: Positive for cough, chest tightness and shortness of breath.   Cardiovascular: Negative for chest pain and leg swelling.  Gastrointestinal: Negative for abdominal pain, nausea and vomiting.  Genitourinary: Negative for dysuria, hematuria, vaginal bleeding and vaginal discharge.  Musculoskeletal: Negative for arthralgias and myalgias.  Skin: Negative for rash.  Neurological: Negative for dizziness, weakness and headaches.   all other systems are negative except as noted in the HPI and PMH.     Physical Exam Updated Vital Signs BP (!) 149/92 (BP Location: Right Arm)   Pulse (!) 124   Temp 98.3 F (36.8 C) (Oral)   Resp (!) 28   Ht 5\' 3"  (1.6 m)   Wt 52.2 kg (115 lb)   SpO2 98%   BMI 20.37 kg/m   Physical Exam  Constitutional: She is oriented to person, place, and time. She appears well-developed and well-nourished. She appears distressed.  Moderate respiratory distress, tripoding, increased work of breathing  HENT:  Head: Normocephalic and atraumatic.  Mouth/Throat: Oropharynx is clear and moist. No oropharyngeal exudate.  Eyes: Conjunctivae and EOM are normal. Pupils are equal, round, and reactive to light.  Neck: Normal range of motion. Neck supple.  No meningismus.  Cardiovascular: Normal rate, regular rhythm, normal heart sounds and intact distal pulses.  No murmur heard. Pulmonary/Chest: She is in respiratory distress. She has wheezes.  Decreased Breath sounds bilaterally. Scattered expiratory wheezing. Increased work of breathing with accessory muscle use  Abdominal: Soft. There is no tenderness. There is no rebound and no guarding.  Musculoskeletal: Normal range of motion. She exhibits no edema or tenderness.  Neurological:  She is alert and oriented to person, place, and time. No cranial nerve deficit. She exhibits normal muscle tone. Coordination normal.   5/5 strength throughout. CN 2-12 intact.Equal grip strength.   Skin: Skin is warm. Capillary refill takes less than 2 seconds. No rash noted. No erythema.  Psychiatric: She has a normal mood and affect. Her behavior is normal.  Nursing note and vitals reviewed.    ED Treatments / Results  Labs (all labs ordered are listed, but only abnormal results are displayed) Labs Reviewed  BASIC METABOLIC PANEL - Abnormal; Notable for the following components:      Result Value   Glucose, Bld 103 (*)    All other components within normal limits  CBC - Abnormal; Notable for the following components:   WBC 13.0 (*)    MCV 104.4 (*)    MCH 34.2 (*)    All other components  within normal limits  BRAIN NATRIURETIC PEPTIDE - Abnormal; Notable for the following components:   B Natriuretic Peptide 159.0 (*)    All other components within normal limits  BLOOD GAS, ARTERIAL - Abnormal; Notable for the following components:   pO2, Arterial 71.3 (*)    All other components within normal limits  MRSA PCR SCREENING  TROPONIN I  MAGNESIUM  PHOSPHORUS  VITAMIN B12  FOLATE    EKG  EKG Interpretation  Date/Time:  Thursday March 16 2017 01:54:32 EST Ventricular Rate:  122 PR Interval:    QRS Duration: 83 QT Interval:  323 QTC Calculation: 461 R Axis:   99 Text Interpretation:  Right and left arm electrode reversal, interpretation assumes no reversal Sinus tachycardia Right axis deviation Artifact Confirmed by Glynn Octaveancour, Shamiya Demeritt [phone]([id number]) on 03/16/2017 2:02:04 AM       Radiology Dg Chest Port 1 View  Result Date: 03/16/2017 CLINICAL DATA:  Shortness of breath since Monday, increasing tonight. History of asthma, COPD, former smoker. EXAM: PORTABLE CHEST 1 VIEW COMPARISON:  04/06/2016 FINDINGS: Normal heart size and pulmonary vascularity. Lungs appear clear and  expanded. Emphysematous changes in the lungs with scattered fibrosis and peribronchial thickening suggesting chronic bronchitis. No blunting of costophrenic angles. No pneumothorax. Calcification of the aorta. 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 No evidence of active pulmonary disease. Aortic atherosclerosis. Electronically Signed   By: Burman NievesWilliam  Stevens M.D.   On: 03/16/2017 02:16    Procedures Procedures (including critical care time)  Medications Ordered in ED Medications  albuterol (PROVENTIL) (2.5 MG/3ML) 0.083% nebulizer solution 5 mg (not administered)  magnesium sulfate IVPB 2 g 50 mL (not administered)  albuterol (PROVENTIL,VENTOLIN) solution continuous neb (not administered)  ipratropium (ATROVENT) nebulizer solution 0.5 mg (not administered)     Initial Impression / Assessment and Plan / ED Course  I have reviewed the triage vital signs and the nursing notes.  Pertinent labs & imaging results that were available during my care of the patient were reviewed by me and considered in my medical decision making (see chart for details).    Patient arrives in respiratory distress with coughing and shortness of breath.  History of COPD.  Patient placed on BiPAP on arrival, given continuous nebulizer and magnesium.  She received steroids from EMS.  EKG is nonischemic  ABG shows no CO2 retention.  X-ray is negative for infiltrate.  Does show emphysema.  The patient improved on BiPAP with nebulizers, steroids and magnesium.  Labs are reassuring.  Patient does not want to be intubated.  She is DNR and DNI. She is stable on bipap at this time.   She is improved on BiPAP but still remains very diminished.   admission discussed with Dr. Robb Matarrtiz at bedside.  CRITICAL CARE Performed by: Glynn OctaveANCOUR, Romario Tith Total critical care time: 45 minutes Critical care time was exclusive of separately billable procedures and treating other patients. Critical care was  necessary to treat or prevent imminent or life-threatening deterioration. Critical care was time spent personally by me on the following activities: development of treatment plan with patient and/or surrogate as well as nursing, discussions with consultants, evaluation of patient's response to treatment, examination of patient, obtaining history from patient or surrogate, ordering and performing treatments and interventions, ordering and review of laboratory studies, ordering and review of radiographic studies, pulse oximetry and re-evaluation of patient's condition.   Final Clinical Impressions(s) / ED Diagnoses   Final diagnoses:  Acute respiratory failure with hypoxia (HCC)  COPD exacerbation (  [facility])    ED Discharge Orders    None       Ojas Coone, Jeannett Senior, MD 03/16/17 [phone]

## 2017-03-16 NOTE — Progress Notes (Signed)
Report given to 300 nurse, patient transferred to room via wheelchair.

## 2017-03-16 NOTE — Progress Notes (Signed)
PROGRESS NOTE  Infant Leslie Porter:[id number] Bushy2RN:[id number] DOB: 04/26/1945 DOA: 03/16/2017 PCP: Eartha InchBadger, Michael C, MD  Brief History:  71 year old female with a history of COPD and chronic respiratory failure presenting with one-month history of shortness of breath with significant worsening over the past 24 hours prior to admission.  Patient states that she has had 3 office visits with her primary care provider in the last month where she has received prescriptions for antibiotics and prednisone.  She states that she would have a short period of improvement followed by worsening clinically.  She has a nonproductive cough, but denies any fevers, chills, chest pain, hemoptysis, nausea, vomiting, diarrhea, abdominal pain.  The patient quit smoking tobacco in September 2017, but she continues to vape.  Upon EMS arrival, the patient was noted to have 80% oxygen saturation on 2 L.  Apparently, the patient was on BiPAP for short period of time in the emergency department and has been since taken off and remained fairly stable.  She was given bronchodilators and intravenous Solu-Medrol for COPD exacerbation.  Assessment/Plan: Acute on chronic respiratory failure with hypoxia -Secondary to COPD exacerbation -Presently stable on 3.5 L -Patient normally uses 2 L nasal cannula on as needed basis at home -Wean oxygen for saturation greater than 92% -personally reviewed EKG--sinus, nonspecific T wave change, RAD -check mag  COPD exacerbation -Add Pulmicort -Continue duo nebs -Continue IV Solu-Medrol -Personally reviewed CXR--bronchial wall thickening, no consolidation  Leukocytosis -In part due to patient's recent steroid use -She is afebrile and hemodynamically stable  Nicotine abuse -cessation discussed       Disposition Plan:   Home in 2-3 days  Family Communication:  No Family at bedside--Total time spent 35 minutes.  Greater than 50% spent face to face counseling and coordinating care.  [id number] [phone] [id number]   Consultants:  none  Code Status: DNR  DVT Prophylaxis:  [place] Lovenox   Procedures: As Listed in Progress Note Above  Antibiotics: None    Subjective: Patient states that her breathing is somewhat better than yesterday, but she is still having shortness of breath with minimal exertion.  She has a nonproductive cough.  She denies any fevers, chills, chest pain, hemoptysis, nausea, vomiting, diarrhea, abdominal pain, headache, neck pain.  Objective: Vitals:   03/16/17 0600 03/16/17 0700 03/16/17 0800 03/16/17 0830  BP: (!) 112/54 138/73 106/77   Pulse: 98 (!) 108 (!) 116   Resp: (!) 22 (!) 22 (!) 29   Temp:      TempSrc:      SpO2: 96% 95% 91% 90%  Weight:      Height:        Intake/Output Summary (Last 24 hours) at 03/16/2017 0841 Last data filed at 03/16/2017 0319 Gross per 24 hour  Intake 50 ml  Output -  Net 50 ml   Weight change:  Exam:   General:  Pt is alert, follows commands appropriately, not in acute distress  HEENT: No icterus, No thrush, No neck mass, [place]/AT  Cardiovascular: RRR, S1/S2, no rubs, no gallops  Respiratory: Diminished breath sounds bilateral.  Bibasilar rales.  Bilateral wheezing.  Good air movement.  Abdomen: Soft/+BS, non tender, non distended, no guarding  Extremities: No edema, No lymphangitis, No petechiae, No rashes, no synovitis   Data Reviewed: I have personally reviewed following labs and imaging studies Basic Metabolic Panel: Recent Labs  Lab 03/16/17 0211  NA 138  K 3.6  CL 102  CO2 28  GLUCOSE 103*  BUN 17  CREATININE 0.53  CALCIUM 9.2  MG 2.0  PHOS 3.6   Liver Function Tests: No results for input(s): AST, ALT, ALKPHOS, BILITOT, PROT, ALBUMIN in the last 168 hours. No results for input(s): LIPASE, AMYLASE in the last 168 hours. No results for input(s): AMMONIA in the last 168 hours. Coagulation Profile: No results for input(s): INR, PROTIME in the last 168 hours. CBC: Recent Labs  Lab  03/16/17 0211  WBC 13.0*  HGB 14.0  HCT 42.7  MCV 104.4*  PLT 290   Cardiac Enzymes: Recent Labs  Lab 03/16/17 0211  TROPONINI <0.03   BNP: Invalid input(s): POCBNP CBG: No results for input(s): GLUCAP in the last 168 hours. HbA1C: No results for input(s): HGBA1C in the last 72 hours. Urine analysis:    Component Value Date/Time   COLORURINE YELLOW 04/06/2016 1142   APPEARANCEUR CLEAR 04/06/2016 1142   LABSPEC <1.005 (L) 04/06/2016 1142   PHURINE 6.0 04/06/2016 1142   GLUCOSEU NEGATIVE 04/06/2016 1142   HGBUR NEGATIVE 04/06/2016 1142   BILIRUBINUR NEGATIVE 04/06/2016 1142   KETONESUR NEGATIVE 04/06/2016 1142   PROTEINUR NEGATIVE 04/06/2016 1142   NITRITE NEGATIVE 04/06/2016 1142   LEUKOCYTESUR TRACE (A) 04/06/2016 1142   Sepsis Labs: @LABRCNTIP (procalcitonin:4,lacticidven:4) )No results found for this or any previous visit (from the past 240 hour(s)).   Scheduled Meds: . enoxaparin (LOVENOX) injection  40 mg Subcutaneous Q24H  . ipratropium-albuterol  3 mL Nebulization Q6H  . methylPREDNISolone (SOLU-MEDROL) injection  40 mg Intravenous Q6H   Continuous Infusions:  Procedures/Studies: Dg Chest Port 1 View  Result Date: 03/16/2017 CLINICAL DATA:  Shortness of breath since Monday, increasing tonight. History of asthma, COPD, former smoker. EXAM: PORTABLE CHEST 1 VIEW COMPARISON:  04/06/2016 FINDINGS: Normal heart size and pulmonary vascularity. Lungs appear clear and expanded. Emphysematous changes in the lungs with scattered fibrosis and peribronchial thickening suggesting chronic bronchitis. No blunting of costophrenic angles. No pneumothorax. Calcification of the aorta. 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 No evidence of active pulmonary disease. Aortic atherosclerosis. Electronically Signed   By: Burman NievesWilliam  Stevens M.D.   On: 03/16/2017 02:16    Curtis Cain, DO  [facility] Pager [phone]  If 7PM-7AM, please contact  night-coverage [email or web address] Password [email or web address] 03/16/2017, 8:41 AM   LOS: 0 days

## 2017-03-17 DIAGNOSIS — R0902 Hypoxemia: Secondary | ICD-10-CM

## 2017-03-17 DIAGNOSIS — R0609 Other forms of dyspnea: Secondary | ICD-10-CM

## 2017-03-17 DIAGNOSIS — J9620 Acute and chronic respiratory failure, unspecified whether with hypoxia or hypercapnia: Secondary | ICD-10-CM

## 2017-03-17 DIAGNOSIS — R0989 Other specified symptoms and signs involving the circulatory and respiratory systems: Secondary | ICD-10-CM

## 2017-03-17 LAB — CBC WITH DIFFERENTIAL/PLATELET
BASOS PCT: 0 %
Basophils Absolute: 0 10*3/uL (ref 0.0–0.1)
EOS ABS: 0 10*3/uL (ref 0.0–0.7)
EOS PCT: 0 %
HCT: 42 % (ref 36.0–46.0)
HEMOGLOBIN: 13.4 g/dL (ref 12.0–15.0)
LYMPHS ABS: 0.7 10*3/uL (ref 0.7–4.0)
Lymphocytes Relative: 6 %
MCH: 33.3 pg (ref 26.0–34.0)
MCHC: 31.9 g/dL (ref 30.0–36.0)
MCV: 104.5 fL — ABNORMAL HIGH (ref 78.0–100.0)
MONOS PCT: 6 %
Monocytes Absolute: 0.8 10*3/uL (ref 0.1–1.0)
NEUTROS PCT: 88 %
Neutro Abs: 11.5 10*3/uL — ABNORMAL HIGH (ref 1.7–7.7)
PLATELETS: 347 10*3/uL (ref 150–400)
RBC: 4.02 MIL/uL (ref 3.87–5.11)
RDW: 12.4 % (ref 11.5–15.5)
WBC: 13 10*3/uL — ABNORMAL HIGH (ref 4.0–10.5)

## 2017-03-17 LAB — BASIC METABOLIC PANEL
Anion gap: 7 (ref 5–15)
BUN: 12 mg/dL (ref 6–20)
CALCIUM: 8.4 mg/dL — AB (ref 8.9–10.3)
CHLORIDE: 102 mmol/L (ref 101–111)
CO2: 28 mmol/L (ref 22–32)
CREATININE: 0.43 mg/dL — AB (ref 0.44–1.00)
Glucose, Bld: 134 mg/dL — ABNORMAL HIGH (ref 65–99)
Potassium: 3.8 mmol/L (ref 3.5–5.1)
SODIUM: 137 mmol/L (ref 135–145)

## 2017-03-17 LAB — MRSA PCR SCREENING: MRSA by PCR: INVALID — AB

## 2017-03-17 LAB — MAGNESIUM: MAGNESIUM: 2.3 mg/dL (ref 1.7–2.4)

## 2017-03-17 MED ORDER — METHYLPREDNISOLONE SODIUM SUCC 125 MG IJ SOLR
60.0000 mg | Freq: Four times a day (QID) | INTRAMUSCULAR | Status: DC
  Administered 2017-03-17 – 2017-03-18 (×4): 60 mg via INTRAVENOUS
  Filled 2017-03-17 (×4): qty 2

## 2017-03-17 MED ORDER — BUDESONIDE 0.5 MG/2ML IN SUSP
0.5000 mg | Freq: Two times a day (BID) | RESPIRATORY_TRACT | Status: DC
  Administered 2017-03-17 – 2017-03-18 (×2): 0.5 mg via RESPIRATORY_TRACT
  Filled 2017-03-17 (×2): qty 2

## 2017-03-17 NOTE — Progress Notes (Signed)
Nutrition Brief Note  RD consulted as part of COPD Gold order set. Of note, she did not report any weight loss or poor intake on MST  Wt Readings from Last 15 Encounters:  03/16/17 117 lb 1 oz (53.1 kg)  10/04/16 121 lb 2 oz (54.9 kg)  04/06/16 145 lb (65.8 kg)  01/12/16 148 lb 13 oz (67.5 kg)  07/25/15 140 lb (63.5 kg)  06/24/15 140 lb 9.6 oz (63.8 kg)   Body mass index is 20.74 kg/m. Patient meets criteria for healthy weight/height based on current BMI.   Patient seen up in bed reading a book. She states PTA she was eating normally. At baseline, she says she eats at least a few times a day. She takes vitamins. Her sob has not impacted her intake.   She gives a UBW of 115 lbs. RD asked about apparent weight loss in chart; she appeared to be weighing in the 140's in 2017. She states that after getting out of the hospital in Sept 2017, "I felt like a blob" and wanted to lose weight. She says she started dieting and exercising. She maintains weight loss was intentional. Her weight has been relatively stable x 4 months. She says she doesn't want to be here.  Current diet order is heart healthy. She did not eat much for breakfast, but says she doesn't like breakfast food. In the future, she just wants a grilled chicken sandwich for breakfast.   If nutrition issues arise, please consult RD.   Christophe LouisNathan Franks RD, LDN, CNSC Clinical Nutrition Pager: [phone] 03/17/2017 10:34 AM

## 2017-03-17 NOTE — Care Management Note (Signed)
Case Management Note  Patient Details  Name: Leslie Porter MRN: [id number] Date of Birth: 08/16/1945  Subjective/Objective:          Admitted with COPD exacerbation. Pt is from home, lives alone, ind, drives, has PCP, medications coverage. She has neb machine and home oxygen pta but only uses it as needed. She communicates no needs or concerns.           Action/Plan: DC home with self care. No CM needs noted at this time.   Expected Discharge Date:  03/18/17               Expected Discharge Plan:  Home/Self Care  In-House Referral:  NA  Discharge planning Services  NA  Post Acute Care Choice:  NA Choice offered to:  NA  Status of Service:  Completed, signed off  Malcolm MetroChildress, Theoplis Garciagarcia Demske, RN 03/17/2017, 11:44 AM

## 2017-03-17 NOTE — Progress Notes (Signed)
SATURATION QUALIFICATIONS: (This note is used to comply with regulatory documentation for home oxygen)  Patient Saturations on Room Air at Rest = 92%  Patient Saturations on Room Air while Ambulating = 87%  Patient Saturations on 2 Liters of oxygen while Ambulating = 95%  Please briefly explain why patient needs home oxygen: To maintain 02 sat at 90% or above during ambulation.  DTat

## 2017-03-17 NOTE — Progress Notes (Signed)
PROGRESS NOTE  Grace Bushyatricia Wiedel WUJ:[id number] DOB: 03/06/1946 DOA: 03/16/2017 PCP: Eartha InchBadger, Michael C, MD  Brief History:  71 year old female with a history of COPD and chronic respiratory failure presenting with one-month history of shortness of breath with significant worsening over the past 24 hours prior to admission.  Patient states that she has had 3 office visits with her primary care provider in the last month where she has received prescriptions for antibiotics and prednisone.  She states that she would have a short period of improvement followed by worsening clinically.  She has a nonproductive cough, but denies any fevers, chills, chest pain, hemoptysis, nausea, vomiting, diarrhea, abdominal pain.  The patient quit smoking tobacco in September 2017, but she continues to vape.  Upon EMS arrival, the patient was noted to have 80% oxygen saturation on 2 L.  Apparently, the patient was on BiPAP for short period of time in the emergency department and has been since taken off and remained fairly stable.  She was given bronchodilators and intravenous Solu-Medrol for COPD exacerbation.  Assessment/Plan: Acute on chronic respiratory failure with hypoxia -Secondary to COPD exacerbation -Presently stable on 3.5 L>>>2L -Patient normally uses 2 L nasal cannula on as needed basis at home -Wean oxygen for saturation greater than 92% -personally reviewed EKG--sinus, nonspecific T wave change, RAD -check mag--2.3 -still wheezing  COPD exacerbation -Continue Pulmicort -Continue duo nebs -Continue IV Solu-Medrol -Personally reviewed CXR--bronchial wall thickening, no consolidation  Leukocytosis -In part due to patient's recent steroid use -She is afebrile and hemodynamically stable  Nicotine abuse -cessation discussed    Disposition Plan:   Home 11/24 if stable Family Communication:  no family present   Consultants:  none  Code Status: DNR  DVT Prophylaxis:  [place]  Lovenox   Procedures: As Listed in Progress Note Above  Antibiotics: None      Subjective: Patient states that she is breathing better.  She still has a nonproductive cough.  She does have some dyspnea on exertion with mild to moderate exertion.  Denies any fevers, chills, headache, chest pain, nausea, vomiting, diarrhea, abdominal pain.  No dysuria or hematuria.  Objective: Vitals:   03/17/17 0404 03/17/17 0724 03/17/17 1423 03/17/17 1454  BP: 136/60   (!) 159/75  Pulse: 97   (!) 102  Resp: 18   18  Temp: 98 F (36.7 C)   97.7 F (36.5 C)  TempSrc: Oral   Oral  SpO2: 91% 95% 93% 98%  Weight:      Height:        Intake/Output Summary (Last 24 hours) at 03/17/2017 1614 Last data filed at 03/17/2017 1300 Gross per 24 hour  Intake 480 ml  Output -  Net 480 ml   Weight change:  Exam:   General:  Pt is alert, follows commands appropriately, not in acute distress  HEENT: No icterus, No thrush, No neck mass, [place]/AT  Cardiovascular: RRR, S1/S2, no rubs, no gallops  Respiratory: Bilateral rales.  Bibasilar wheezing.  Abdomen: Soft/+BS, non tender, non distended, no guarding  Extremities: No edema, No lymphangitis, No petechiae, No rashes, no synovitis   Data Reviewed: I have personally reviewed following labs and imaging studies Basic Metabolic Panel: Recent Labs  Lab 03/16/17 0211 03/17/17 0504  NA 138 137  K 3.6 3.8  CL 102 102  CO2 28 28  GLUCOSE 103* 134*  BUN 17 12  CREATININE 0.53 0.43*  CALCIUM 9.2 8.4*  MG 2.0 2.3  PHOS 3.6  --    Liver Function Tests: No results for input(s): AST, ALT, ALKPHOS, BILITOT, PROT, ALBUMIN in the last 168 hours. No results for input(s): LIPASE, AMYLASE in the last 168 hours. No results for input(s): AMMONIA in the last 168 hours. Coagulation Profile: No results for input(s): INR, PROTIME in the last 168 hours. CBC: Recent Labs  Lab 03/16/17 0211 03/17/17 0504  WBC 13.0* 13.0*  NEUTROABS  --  11.5*  HGB  14.0 13.4  HCT 42.7 42.0  MCV 104.4* 104.5*  PLT 290 347   Cardiac Enzymes: Recent Labs  Lab 03/16/17 0211  TROPONINI <0.03   BNP: Invalid input(s): POCBNP CBG: No results for input(s): GLUCAP in the last 168 hours. HbA1C: No results for input(s): HGBA1C in the last 72 hours. Urine analysis:    Component Value Date/Time   COLORURINE YELLOW 04/06/2016 1142   APPEARANCEUR CLEAR 04/06/2016 1142   LABSPEC <1.005 (L) 04/06/2016 1142   PHURINE 6.0 04/06/2016 1142   GLUCOSEU NEGATIVE 04/06/2016 1142   HGBUR NEGATIVE 04/06/2016 1142   BILIRUBINUR NEGATIVE 04/06/2016 1142   KETONESUR NEGATIVE 04/06/2016 1142   PROTEINUR NEGATIVE 04/06/2016 1142   NITRITE NEGATIVE 04/06/2016 1142   LEUKOCYTESUR TRACE (A) 04/06/2016 1142   Sepsis Labs: @LABRCNTIP (procalcitonin:4,lacticidven:4) ) Recent Results (from the past 240 hour(s))  MRSA PCR Screening     Status: Abnormal   Collection Time: 03/16/17  3:34 AM  Result Value Ref Range Status   MRSA by PCR INVALID RESULTS, SPECIMEN SENT FOR CULTURE (A) NEGATIVE Final    Comment: RESULT CALLED TO, READ BACK BY AND VERIFIED WITH: MARTIN,J @ 0452 BY MATTHEWS, B 11.23.18      Scheduled Meds: . budesonide (PULMICORT) nebulizer solution  0.5 mg Nebulization BID  . enoxaparin (LOVENOX) injection  40 mg Subcutaneous Q24H  . ipratropium-albuterol  3 mL Nebulization Q6H  . methylPREDNISolone (SOLU-MEDROL) injection  60 mg Intravenous Q6H   Continuous Infusions:  Procedures/Studies: Dg Chest Port 1 View  Result Date: 03/16/2017 CLINICAL DATA:  Shortness of breath since Monday, increasing tonight. History of asthma, COPD, former smoker. EXAM: PORTABLE CHEST 1 VIEW COMPARISON:  04/06/2016 FINDINGS: Normal heart size and pulmonary vascularity. Lungs appear clear and expanded. Emphysematous changes in the lungs with scattered fibrosis and peribronchial thickening suggesting chronic bronchitis. No blunting of costophrenic angles. No pneumothorax.  Calcification of the aorta. 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 No evidence of active pulmonary disease. Aortic atherosclerosis. Electronically Signed   By: Burman NievesWilliam  Stevens M.D.   On: 03/16/2017 02:16    Malania Gawthrop, DO  [facility] Pager [phone]  If 7PM-7AM, please contact night-coverage [email or web address] Password [email or web address] 03/17/2017, 4:14 PM   LOS: 1 day

## 2017-03-17 NOTE — Discharge Summary (Signed)
Physician Discharge Summary  Grace Bushyatricia Hentges ZOX:[id number] DOB: 10/01/1945 DOA: 03/16/2017  PCP: Eartha InchBadger, Michael C, MD  Admit date: 03/16/2017 Discharge date: 03/18/17  Admitted From: Home Disposition:  Home  Recommendations for Outpatient Follow-up:  1. Follow up with PCP in 1-2 weeks 2. Please obtain BMP/CBC in one week    Discharge Condition: Stable CODE STATUS: FULL Diet recommendation: Heart Healthy / Carb Modified / Dysphagia / Regular   Brief/Interim Summary: 71 year old female with a history of COPD and chronic respiratory failure presenting with one-month history of shortness of breath with significant worsening over the past 24 hours prior to admission. Patient states that she has had 3 office visits with her primary care provider in the last month where she has received prescriptions for antibiotics and prednisone. She states that she would have a short period of improvement followed by worsening clinically. She has a nonproductive cough, but denies any fevers, chills, chest pain, hemoptysis, nausea, vomiting, diarrhea, abdominal pain. The patient quit smoking tobacco in September 2017, but she continues to vape.Upon EMS arrival, the patient was noted to have 80% oxygen saturation on 2 L. Apparently, the patient was on BiPAP for short period of time in the emergency department and has been since taken off and remained fairly stable. She was given bronchodilators and intravenous Solu-Medrol for COPD exacerbation.     Discharge Diagnoses:  Acute on chronic respiratory failure with hypoxia -Secondary to COPD exacerbation -Presently stable on 3.5 L>>>3L -Patient normally uses 2 L nasal cannula on as needed basis at home -Wean oxygen for saturation greater than 92% -personally reviewed EKG--sinus, nonspecific T wave change, RAD -check mag--2.3 -ambulatory pulse ox showed desaturation--instructed pt to use oxygen 24/7--she already has oxygen set up at home -send  patient home on 3L [place]  COPD exacerbation -Continue Pulmicort-->restart symbicort after d/c -Continue duo nebs -Continue IV Solu-Medrol>>>prednisone taper over 2 weeks -Personally reviewedCXR--bronchial wall thickening,no consolidation  Leukocytosis -In part due to patient's recent steroid use -She is afebrile and hemodynamically stable  Nicotine abuse -cessation discussed -she continues to vape with no desire to quit     Discharge Instructions  Discharge Instructions    Diet general   Complete by:  As directed    Increase activity slowly   Complete by:  As directed      Allergies as of 03/18/2017      Reactions   Levofloxacin Shortness Of Breath      Medication List    STOP taking these medications   furosemide 20 MG tablet Commonly known as:  LASIX     TAKE these medications   albuterol 108 (90 Base) MCG/ACT inhaler Commonly known as:  PROVENTIL HFA;VENTOLIN HFA Inhale 1-2 puffs into the lungs every 6 (six) hours as needed for wheezing or shortness of breath.   alendronate 70 MG tablet Commonly known as:  FOSAMAX Take 70 mg by mouth every Sunday.   budesonide-formoterol 160-4.5 MCG/ACT inhaler Commonly known as:  SYMBICORT Inhale 2 puffs into the lungs 2 (two) times daily.   FLUTTER Devi Use device as directed   guaiFENesin 600 MG 12 hr tablet Commonly known as:  MUCINEX Take 1 tablet (600 mg total) by mouth 2 (two) times daily as needed for cough or to loosen phlegm.   ipratropium-albuterol 0.5-2.5 (3) MG/3ML Soln Commonly known as:  DUONEB Take 3 mLs by nebulization 2 (two) times daily.   montelukast 10 MG tablet Commonly known as:  SINGULAIR Take 10 mg by mouth at bedtime.   multivitamin  capsule Take 1 capsule by mouth daily.   predniSONE 10 MG tablet Commonly known as:  DELTASONE Take 6 tablets (60 mg total) by mouth daily with breakfast. And decrease by one tablet every 2 days Start taking on:  03/19/2017 What changed:     medication strength  how much to take  additional instructions   umeclidinium bromide 62.5 MCG/INH Aepb Commonly known as:  INCRUSE ELLIPTA Inhale 1 puff into the lungs daily.            Durable Medical Equipment  (From admission, onward)        Start     Ordered   03/18/17 1218  For home use only DME oxygen  Once    Question Answer Comment  Mode or (Route) Nasal cannula   Liters per Minute 3   Frequency Continuous (stationary and portable oxygen unit needed)   Oxygen conserving device Yes   Oxygen delivery system Gas      03/18/17 1217      Allergies  Allergen Reactions  . Levofloxacin Shortness Of Breath    Consultations:  none   Procedures/Studies: Dg Chest Port 1 View  Result Date: 03/16/2017 CLINICAL DATA:  Shortness of breath since Monday, increasing tonight. History of asthma, COPD, former smoker. EXAM: PORTABLE CHEST 1 VIEW COMPARISON:  04/06/2016 FINDINGS: Normal heart size and pulmonary vascularity. Lungs appear clear and expanded. Emphysematous changes in the lungs with scattered fibrosis and peribronchial thickening suggesting chronic bronchitis. No blunting of costophrenic angles. No pneumothorax. Calcification of the aorta. 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 No evidence of active pulmonary disease. Aortic atherosclerosis. Electronically Signed   By: Burman NievesWilliam  Stevens M.D.   On: 03/16/2017 02:16        Discharge Exam: Vitals:   03/17/17 2131 03/18/17 0806  BP: (!) 160/71   Pulse: 98   Resp: 19   Temp: 98 F (36.7 C)   SpO2: 100% 97%   Vitals:   03/17/17 1454 03/17/17 1955 03/17/17 2131 03/18/17 0806  BP: (!) 159/75  (!) 160/71   Pulse: (!) 102 74 98   Resp: 18 18 19    Temp: 97.7 F (36.5 C)  98 F (36.7 C)   TempSrc: Oral  Oral   SpO2: 98% 92% 100% 97%  Weight:      Height:        General: Pt is alert, awake, not in acute distress Cardiovascular: RRR, S1/S2 +, no rubs, no  gallops Respiratory: CTA bilaterally, no wheezing, no rhonchi Abdominal: Soft, NT, ND, bowel sounds + Extremities: no edema, no cyanosis   The results of significant diagnostics from this hospitalization (including imaging, microbiology, ancillary and laboratory) are listed below for reference.    Significant Diagnostic Studies: Dg Chest Port 1 View  Result Date: 03/16/2017 CLINICAL DATA:  Shortness of breath since Monday, increasing tonight. History of asthma, COPD, former smoker. EXAM: PORTABLE CHEST 1 VIEW COMPARISON:  04/06/2016 FINDINGS: Normal heart size and pulmonary vascularity. Lungs appear clear and expanded. Emphysematous changes in the lungs with scattered fibrosis and peribronchial thickening suggesting chronic bronchitis. No blunting of costophrenic angles. No pneumothorax. Calcification of the aorta. 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 No evidence of active pulmonary disease. Aortic atherosclerosis. Electronically Signed   By: Burman NievesWilliam  Stevens M.D.   On: 03/16/2017 02:16     Microbiology: Recent Results (from the past 240 hour(s))  MRSA PCR Screening     Status: Abnormal   Collection Time: 03/16/17  3:34 AM  Result Value Ref Range Status   MRSA by PCR INVALID RESULTS, SPECIMEN SENT FOR CULTURE (A) NEGATIVE Final    Comment: RESULT CALLED TO, READ BACK BY AND VERIFIED WITH: MARTIN,J @ 0452 BY MATTHEWS, B 11.23.18      Labs: Basic Metabolic Panel: Recent Labs  Lab 03/16/17 0211 03/17/17 0504  NA 138 137  K 3.6 3.8  CL 102 102  CO2 28 28  GLUCOSE 103* 134*  BUN 17 12  CREATININE 0.53 0.43*  CALCIUM 9.2 8.4*  MG 2.0 2.3  PHOS 3.6  --    Liver Function Tests: No results for input(s): AST, ALT, ALKPHOS, BILITOT, PROT, ALBUMIN in the last 168 hours. No results for input(s): LIPASE, AMYLASE in the last 168 hours. No results for input(s): AMMONIA in the last 168 hours. CBC: Recent Labs  Lab 03/16/17 0211 03/17/17 0504  WBC  13.0* 13.0*  NEUTROABS  --  11.5*  HGB 14.0 13.4  HCT 42.7 42.0  MCV 104.4* 104.5*  PLT 290 347   Cardiac Enzymes: Recent Labs  Lab 03/16/17 0211  TROPONINI <0.03   BNP: Invalid input(s): POCBNP CBG: No results for input(s): GLUCAP in the last 168 hours.  Time coordinating discharge:  Greater than 30 minutes  Signed:  Dianna Ewald, DO [facility] Pager: [phone] 03/18/2017, 12:17 PM

## 2017-03-18 LAB — MRSA CULTURE: CULTURE: NOT DETECTED

## 2017-03-18 MED ORDER — PREDNISONE 20 MG PO TABS: 60.0000 mg | ORAL_TABLET | Freq: Every day | ORAL | Status: DC

## 2017-03-18 MED ORDER — PREDNISONE 10 MG PO TABS: 60.0000 mg | ORAL_TABLET | Freq: Every day | ORAL | 0 refills | Status: DC

## 2017-03-18 MED ORDER — BUDESONIDE 0.5 MG/2ML IN SUSP: 0.5000 mg | Freq: Two times a day (BID) | RESPIRATORY_TRACT | 1 refills | Status: DC

## 2017-03-18 NOTE — Progress Notes (Signed)
SATURATION QUALIFICATIONS: (This note is used to comply with regulatory documentation for home oxygen)  Patient Saturations on Room Air at Rest = 91%  Patient Saturations on Room Air while Ambulating = 88%  Patient Saturations on 3 Liters of oxygen while Ambulating = 94%  Please briefly explain why patient needs home oxygen: To maintain 02 sat at 90% or above during ambulation.  DTat

## 2017-03-18 NOTE — Progress Notes (Signed)
Patient IV removed, telemetry removed, tolerated well. Patient given discharge instructions at bedside.  

## 2017-03-20 NOTE — Care Management (Signed)
CM received call from patient. Pt at home and has continuous oxygen set up through "[employer]" out of [place]. She wanting to know how to go about getting a portable concentrator and switching to a company closer to home. List of DME options given to patient over phone. Pt has chosen [facility]. Bonita [facility]Linda, [facility], given pt info. Pt aware change to another Oxygen company will not be immediate, it will take time and information/orders will need to be gathered from pt's PCP. Pt also asking about Palliative care. Pt informed [place], services at the hospital do not extend into the community. Pt [facility] She plans to contact them today to inquire about palliative care services.

## 2017-03-21 ENCOUNTER — Telehealth: Payer: Self-pay | Admitting: Pulmonary Disease

## 2017-03-21 NOTE — Telephone Encounter (Signed)
I called their office and they are were closed, no answer. Will try again in the morning   Address: [address], [place], [place] [address] Hours:  Open ? Closes 7:30PM  Phone: [phone] Appointments: [email or web address]

## 2017-03-22 ENCOUNTER — Inpatient Hospital Stay (HOSPITAL_COMMUNITY)
Admission: EM | Admit: 2017-03-22 | Discharge: 2017-03-23 | DRG: 190 | Disposition: A | Discharge status: Home / Self Care | Payer: Medicare Other | Attending: Internal Medicine | Admitting: Internal Medicine

## 2017-03-22 ENCOUNTER — Encounter (HOSPITAL_COMMUNITY): Payer: Self-pay

## 2017-03-22 ENCOUNTER — Other Ambulatory Visit: Payer: Self-pay

## 2017-03-22 ENCOUNTER — Emergency Department (HOSPITAL_COMMUNITY): Payer: Medicare Other

## 2017-03-22 DIAGNOSIS — D72829 Elevated white blood cell count, unspecified: Secondary | ICD-10-CM | POA: Diagnosis present

## 2017-03-22 DIAGNOSIS — Z79899 Other long term (current) drug therapy: Secondary | ICD-10-CM | POA: Diagnosis not present

## 2017-03-22 DIAGNOSIS — K219 Gastro-esophageal reflux disease without esophagitis: Secondary | ICD-10-CM | POA: Diagnosis not present

## 2017-03-22 DIAGNOSIS — T380X5A Adverse effect of glucocorticoids and synthetic analogues, initial encounter: Secondary | ICD-10-CM | POA: Diagnosis not present

## 2017-03-22 DIAGNOSIS — M810 Age-related osteoporosis without current pathological fracture: Secondary | ICD-10-CM | POA: Diagnosis not present

## 2017-03-22 DIAGNOSIS — I5189 Other ill-defined heart diseases: Secondary | ICD-10-CM | POA: Diagnosis present

## 2017-03-22 DIAGNOSIS — Z7952 Long term (current) use of systemic steroids: Secondary | ICD-10-CM | POA: Diagnosis not present

## 2017-03-22 DIAGNOSIS — Z881 Allergy status to other antibiotic agents status: Secondary | ICD-10-CM | POA: Diagnosis not present

## 2017-03-22 DIAGNOSIS — J9601 Acute respiratory failure with hypoxia: Secondary | ICD-10-CM

## 2017-03-22 DIAGNOSIS — J441 Chronic obstructive pulmonary disease with (acute) exacerbation: Secondary | ICD-10-CM | POA: Diagnosis present

## 2017-03-22 DIAGNOSIS — X58XXXA Exposure to other specified factors, initial encounter: Secondary | ICD-10-CM | POA: Diagnosis not present

## 2017-03-22 DIAGNOSIS — J9622 Acute and chronic respiratory failure with hypercapnia: Secondary | ICD-10-CM | POA: Diagnosis not present

## 2017-03-22 DIAGNOSIS — E876 Hypokalemia: Secondary | ICD-10-CM | POA: Diagnosis not present

## 2017-03-22 DIAGNOSIS — I519 Heart disease, unspecified: Secondary | ICD-10-CM | POA: Diagnosis not present

## 2017-03-22 DIAGNOSIS — F17290 Nicotine dependence, other tobacco product, uncomplicated: Secondary | ICD-10-CM | POA: Diagnosis present

## 2017-03-22 DIAGNOSIS — I5030 Unspecified diastolic (congestive) heart failure: Secondary | ICD-10-CM | POA: Diagnosis present

## 2017-03-22 DIAGNOSIS — J9620 Acute and chronic respiratory failure, unspecified whether with hypoxia or hypercapnia: Secondary | ICD-10-CM | POA: Diagnosis present

## 2017-03-22 DIAGNOSIS — J9621 Acute and chronic respiratory failure with hypoxia: Secondary | ICD-10-CM | POA: Diagnosis present

## 2017-03-22 DIAGNOSIS — Z7189 Other specified counseling: Secondary | ICD-10-CM

## 2017-03-22 LAB — BLOOD GAS, ARTERIAL
ACID-BASE EXCESS: 4.6 mmol/L — AB (ref 0.0–2.0)
BICARBONATE: 29.2 mmol/L — AB (ref 20.0–28.0)
Delivery systems: POSITIVE
Drawn by: 225631
Expiratory PAP: 6
FIO2: 100
Inspiratory PAP: 12
O2 Saturation: 98.7 %
PATIENT TEMPERATURE: 98.6
pCO2 arterial: 44.9 mmHg (ref 32.0–48.0)
pH, Arterial: 7.429 (ref 7.350–7.450)
pO2, Arterial: 163 mmHg — ABNORMAL HIGH (ref 83.0–108.0)

## 2017-03-22 LAB — CBC WITH DIFFERENTIAL/PLATELET
BASOS ABS: 0 10*3/uL (ref 0.0–0.1)
Basophils Relative: 0 %
Eosinophils Absolute: 0.3 10*3/uL (ref 0.0–0.7)
Eosinophils Relative: 2 %
HEMATOCRIT: 42.8 % (ref 36.0–46.0)
Hemoglobin: 14.5 g/dL (ref 12.0–15.0)
LYMPHS ABS: 1.1 10*3/uL (ref 0.7–4.0)
LYMPHS PCT: 7 %
MCH: 34.4 pg — AB (ref 26.0–34.0)
MCHC: 33.9 g/dL (ref 30.0–36.0)
MCV: 101.4 fL — AB (ref 78.0–100.0)
MONO ABS: 0.9 10*3/uL (ref 0.1–1.0)
Monocytes Relative: 6 %
NEUTROS ABS: 13.6 10*3/uL — AB (ref 1.7–7.7)
Neutrophils Relative %: 85 %
Platelets: 315 10*3/uL (ref 150–400)
RBC: 4.22 MIL/uL (ref 3.87–5.11)
RDW: 12.5 % (ref 11.5–15.5)
WBC: 15.9 10*3/uL — ABNORMAL HIGH (ref 4.0–10.5)

## 2017-03-22 LAB — BASIC METABOLIC PANEL
ANION GAP: 10 (ref 5–15)
BUN: 18 mg/dL (ref 6–20)
CHLORIDE: 95 mmol/L — AB (ref 101–111)
CO2: 29 mmol/L (ref 22–32)
Calcium: 8.5 mg/dL — ABNORMAL LOW (ref 8.9–10.3)
Creatinine, Ser: 0.55 mg/dL (ref 0.44–1.00)
Glucose, Bld: 110 mg/dL — ABNORMAL HIGH (ref 65–99)
POTASSIUM: 3.3 mmol/L — AB (ref 3.5–5.1)
SODIUM: 134 mmol/L — AB (ref 135–145)

## 2017-03-22 MED ORDER — HYDROCODONE-ACETAMINOPHEN 5-325 MG PO TABS: 1.0000 | ORAL_TABLET | ORAL | Status: DC | PRN

## 2017-03-22 MED ORDER — LEVALBUTEROL HCL 1.25 MG/0.5ML IN NEBU: 1.2500 mg | INHALATION_SOLUTION | Freq: Four times a day (QID) | RESPIRATORY_TRACT | Status: DC

## 2017-03-22 MED ORDER — IPRATROPIUM BROMIDE 0.02 % IN SOLN: 0.5000 mg | Freq: Four times a day (QID) | RESPIRATORY_TRACT | Status: DC

## 2017-03-22 MED ORDER — IPRATROPIUM BROMIDE 0.02 % IN SOLN: 0.5000 mg | RESPIRATORY_TRACT | Status: DC

## 2017-03-22 MED ORDER — GUAIFENESIN ER 600 MG PO TB12
600.0000 mg | ORAL_TABLET | Freq: Two times a day (BID) | ORAL | Status: DC
  Administered 2017-03-22 – 2017-03-23 (×3): 600 mg via ORAL
  Filled 2017-03-22 (×3): qty 1

## 2017-03-22 MED ORDER — POTASSIUM CHLORIDE CRYS ER 20 MEQ PO TBCR
40.0000 meq | EXTENDED_RELEASE_TABLET | Freq: Once | ORAL | Status: AC
  Administered 2017-03-22: 40 meq via ORAL
  Filled 2017-03-22: qty 2

## 2017-03-22 MED ORDER — METHYLPREDNISOLONE SODIUM SUCC 125 MG IJ SOLR
125.0000 mg | Freq: Once | INTRAMUSCULAR | Status: AC
  Administered 2017-03-22: 125 mg via INTRAVENOUS
  Filled 2017-03-22: qty 2

## 2017-03-22 MED ORDER — IPRATROPIUM-ALBUTEROL 0.5-2.5 (3) MG/3ML IN SOLN: 3.0000 mL | RESPIRATORY_TRACT | Status: DC

## 2017-03-22 MED ORDER — BUDESONIDE 0.5 MG/2ML IN SUSP: 0.5000 mg | Freq: Every day | RESPIRATORY_TRACT | Status: DC

## 2017-03-22 MED ORDER — ALBUTEROL (5 MG/ML) CONTINUOUS INHALATION SOLN
10.0000 mg/h | INHALATION_SOLUTION | Freq: Once | RESPIRATORY_TRACT | Status: AC
  Administered 2017-03-22: 10 mg/h via RESPIRATORY_TRACT
  Filled 2017-03-22: qty 20

## 2017-03-22 MED ORDER — SENNOSIDES-DOCUSATE SODIUM 8.6-50 MG PO TABS: 1.0000 | ORAL_TABLET | Freq: Every evening | ORAL | Status: DC | PRN

## 2017-03-22 MED ORDER — ONDANSETRON HCL 4 MG PO TABS: 4.0000 mg | ORAL_TABLET | Freq: Four times a day (QID) | ORAL | Status: DC | PRN

## 2017-03-22 MED ORDER — METHYLPREDNISOLONE SODIUM SUCC 125 MG IJ SOLR: 60.0000 mg | Freq: Two times a day (BID) | INTRAMUSCULAR | Status: DC

## 2017-03-22 MED ORDER — ENOXAPARIN SODIUM 40 MG/0.4ML ~~LOC~~ SOLN
40.0000 mg | SUBCUTANEOUS | Status: DC
Start: 2017-03-22 — End: 2017-03-22

## 2017-03-22 MED ORDER — SODIUM CHLORIDE 0.9% FLUSH: 3.0000 mL | INTRAVENOUS | Status: DC | PRN

## 2017-03-22 MED ORDER — ACETAMINOPHEN 650 MG RE SUPP: 650.0000 mg | Freq: Four times a day (QID) | RECTAL | Status: DC | PRN

## 2017-03-22 MED ORDER — DM-GUAIFENESIN ER 30-600 MG PO TB12
1.0000 | ORAL_TABLET | Freq: Two times a day (BID) | ORAL | Status: DC
  Administered 2017-03-22: 1 via ORAL
  Filled 2017-03-22: qty 1

## 2017-03-22 MED ORDER — MONTELUKAST SODIUM 10 MG PO TABS: 10.0000 mg | ORAL_TABLET | Freq: Every day | ORAL | Status: DC

## 2017-03-22 MED ORDER — IPRATROPIUM-ALBUTEROL 0.5-2.5 (3) MG/3ML IN SOLN
3.0000 mL | Freq: Four times a day (QID) | RESPIRATORY_TRACT | Status: DC
  Administered 2017-03-23 (×2): 3 mL via RESPIRATORY_TRACT
  Filled 2017-03-22 (×2): qty 3

## 2017-03-22 MED ORDER — ONDANSETRON HCL 4 MG/2ML IJ SOLN: 4.0000 mg | Freq: Four times a day (QID) | INTRAMUSCULAR | Status: DC | PRN

## 2017-03-22 MED ORDER — IPRATROPIUM-ALBUTEROL 0.5-2.5 (3) MG/3ML IN SOLN
3.0000 mL | Freq: Four times a day (QID) | RESPIRATORY_TRACT | Status: DC
  Administered 2017-03-22 (×2): 3 mL via RESPIRATORY_TRACT
  Filled 2017-03-22 (×2): qty 3

## 2017-03-22 MED ORDER — METHYLPREDNISOLONE SODIUM SUCC 125 MG IJ SOLR
80.0000 mg | Freq: Two times a day (BID) | INTRAMUSCULAR | Status: DC
  Administered 2017-03-22 – 2017-03-23 (×3): 80 mg via INTRAVENOUS
  Filled 2017-03-22 (×3): qty 2

## 2017-03-22 MED ORDER — ALBUTEROL SULFATE (2.5 MG/3ML) 0.083% IN NEBU: 2.5000 mg | INHALATION_SOLUTION | RESPIRATORY_TRACT | Status: DC | PRN

## 2017-03-22 MED ORDER — SODIUM CHLORIDE 0.9% FLUSH
3.0000 mL | Freq: Two times a day (BID) | INTRAVENOUS | Status: DC
  Administered 2017-03-22 – 2017-03-23 (×3): 3 mL via INTRAVENOUS

## 2017-03-22 MED ORDER — UMECLIDINIUM BROMIDE 62.5 MCG/INH IN AEPB: 1.0000 | INHALATION_SPRAY | Freq: Every day | RESPIRATORY_TRACT | Status: DC

## 2017-03-22 MED ORDER — ALBUTEROL SULFATE (2.5 MG/3ML) 0.083% IN NEBU: 2.5000 mg | INHALATION_SOLUTION | Freq: Four times a day (QID) | RESPIRATORY_TRACT | Status: DC

## 2017-03-22 MED ORDER — ACETAMINOPHEN 325 MG PO TABS: 650.0000 mg | ORAL_TABLET | Freq: Four times a day (QID) | ORAL | Status: DC | PRN

## 2017-03-22 MED ORDER — ALENDRONATE SODIUM 70 MG PO TABS: 70.0000 mg | ORAL_TABLET | ORAL | Status: DC

## 2017-03-22 MED ORDER — POTASSIUM CHLORIDE CRYS ER 20 MEQ PO TBCR: 40.0000 meq | EXTENDED_RELEASE_TABLET | Freq: Once | ORAL | Status: DC

## 2017-03-22 MED ORDER — SODIUM CHLORIDE 0.9 % IV SOLN: 250.0000 mL | INTRAVENOUS | Status: DC | PRN

## 2017-03-22 NOTE — Telephone Encounter (Signed)
Called Dr. Herold HarmsBeal office and asked for fax number. [facility] faxed. Nothing further is needed. Fax [phone]

## 2017-03-22 NOTE — H&P (Signed)
History and Physical    Leslie Bushyatricia Tyer ZDG:[id number] DOB: 02/15/1946 DOA: 03/22/2017  PCP: Eartha InchBadger, Michael C, MD  Patient coming from:  home  Chief Complaint:  Sob, wheezing  HPI: Leslie Porter is a 71 y.o. female with medical history significant of advanced copd on 3 liters [place] at home, diastolic dysfunction comes in with several days of worsening sob and wheezing.  Pt was hospitalized last week and sent home on a 2 week prednisone taper had no pna at that time.  She does not smoke but continues to vap.  She denies fever.  Wheezing a lot and sob.  She reports compliance with her medications.  No n/v/d.  Pt  Was on bipap in ED and this has been weaned off down to her 3 liters of oxygen and she is improved.  Pt referred for admission for copde.  Review of Systems: As per HPI otherwise 10 point review of systems negative.   Past Medical History:  Diagnosis Date  . Asthma   . COPD (chronic obstructive pulmonary disease) (HCC)   . Diastolic dysfunction   . Osteoporosis     Past Surgical History:  Procedure Laterality Date  . VAGINAL HYSTERECTOMY       reports that she quit smoking about 14 months ago. Her smoking use included cigarettes. She quit after 55.00 years of use. she has never used smokeless tobacco. She reports that she drinks alcohol. She reports that she does not use drugs.  Allergies  Allergen Reactions  . Levofloxacin Shortness Of Breath    Family History  Problem Relation Age of Onset  . Heart attack Father   . Heart disease Mother     Prior to Admission medications   Medication Sig Start Date End Date Taking? Authorizing Provider  albuterol (PROVENTIL HFA;VENTOLIN HFA) 108 (90 Base) MCG/ACT inhaler Inhale 1-2 puffs into the lungs every 6 (six) hours as needed for wheezing or shortness of breath.   Yes [provider]  alendronate (FOSAMAX) 70 MG tablet Take 70 mg by mouth every Sunday.  12/29/15  Yes [provider]  budesonide (PULMICORT)  0.5 MG/2ML nebulizer solution Take 0.5 mg by nebulization daily.   Yes [provider]  budesonide-formoterol (SYMBICORT) 160-4.5 MCG/ACT inhaler Inhale 2 puffs into the lungs 2 (two) times daily.   Yes [provider]  guaiFENesin (MUCINEX) 600 MG 12 hr tablet Take 1 tablet (600 mg total) by mouth 2 (two) times daily as needed for cough or to loosen phlegm. 01/15/16  Yes Mikhail, Maryann, DO  ipratropium-albuterol (DUONEB) 0.5-2.5 (3) MG/3ML SOLN Take 3 mLs by nebulization 2 (two) times daily.   Yes [provider]  montelukast (SINGULAIR) 10 MG tablet Take 10 mg by mouth at bedtime.   Yes [provider]  Multiple Vitamin (MULTIVITAMIN) capsule Take 1 capsule by mouth daily.   Yes [provider]  predniSONE (DELTASONE) 10 MG tablet Take 6 tablets (60 mg total) by mouth daily with breakfast. And decrease by one tablet every 2 days 03/19/17  Yes Tat, Sriram Febles, MD  umeclidinium bromide (INCRUSE ELLIPTA) 62.5 MCG/INH AEPB Inhale 1 puff into the lungs daily. 11/01/16  Yes Michele McalpineNadel, Scott M, MD  Respiratory Therapy Supplies (FLUTTER) DEVI Use device as directed 10/04/16   Michele McalpineNadel, Scott M, MD    Physical Exam: Vitals:   03/22/17 0335 03/22/17 0415 03/22/17 0538 03/22/17 0655  BP: 126/78  (!) 115/54 (!) 93/55  Pulse: (!) 115  (!) 113 (!) 103  Resp: (!) 32  Marland Kitchen(!)  30 (!) 25  Temp:      TempSrc:      SpO2: 93% 94% (!) 89% 90%  Weight:      Height:         Constitutional: NAD, calm, comfortable Vitals:   03/22/17 0335 03/22/17 0415 03/22/17 0538 03/22/17 0655  BP: 126/78  (!) 115/54 (!) 93/55  Pulse: (!) 115  (!) 113 (!) 103  Resp: (!) 32  (!) 30 (!) 25  Temp:      TempSrc:      SpO2: 93% 94% (!) 89% 90%  Weight:      Height:       Eyes: PERRL, lids and conjunctivae normal ENMT: Mucous membranes are moist. Posterior pharynx clear of any exudate or lesions.Normal dentition.  Neck: normal, supple, no masses, no thyromegaly Respiratory: clear to  auscultation bilaterally,  Wheezing bilaterally, no crackles. Normal respiratory effort. No accessory muscle use.  Cardiovascular: Regular rate and rhythm, no murmurs / rubs / gallops. No extremity edema. 2+ pedal pulses. No carotid bruits.  Abdomen: no tenderness, no masses palpated. No hepatosplenomegaly. Bowel sounds positive.  Musculoskeletal: no clubbing / cyanosis. No joint deformity upper and lower extremities. Good ROM, no contractures. Normal muscle tone.  Skin: no rashes, lesions, ulcers. No induration Neurologic: CN 2-12 grossly intact. Sensation intact, DTR normal. Strength 5/5 in all 4.  Psychiatric: Normal judgment and insight. Alert and oriented x 3. Normal mood.    Labs on Admission: I have personally reviewed following labs and imaging studies  CBC: Recent Labs  Lab 03/16/17 0211 03/17/17 0504 03/22/17 0136  WBC 13.0* 13.0* 15.9*  NEUTROABS  --  11.5* 13.6*  HGB 14.0 13.4 14.5  HCT 42.7 42.0 42.8  MCV 104.4* 104.5* 101.4*  PLT 290 347 315   Basic Metabolic Panel: Recent Labs  Lab 03/16/17 0211 03/17/17 0504 03/22/17 0136  NA 138 137 134*  K 3.6 3.8 3.3*  CL 102 102 95*  CO2 28 28 29   GLUCOSE 103* 134* 110*  BUN 17 12 18   CREATININE 0.53 0.43* 0.55  CALCIUM 9.2 8.4* 8.5*  MG 2.0 2.3  --   PHOS 3.6  --   --    GFR: Estimated Creatinine Clearance: 51.6 mL/min (by C-G formula based on SCr of 0.55 mg/dL). Liver Function Tests: No results for input(s): AST, ALT, ALKPHOS, BILITOT, PROT, ALBUMIN in the last 168 hours. No results for input(s): LIPASE, AMYLASE in the last 168 hours. No results for input(s): AMMONIA in the last 168 hours. Coagulation Profile: No results for input(s): INR, PROTIME in the last 168 hours. Cardiac Enzymes: Recent Labs  Lab 03/16/17 0211  TROPONINI <0.03   Urine analysis:    Component Value Date/Time   COLORURINE YELLOW 04/06/2016 1142   APPEARANCEUR CLEAR 04/06/2016 1142   LABSPEC <1.005 (L) 04/06/2016 1142   PHURINE 6.0  04/06/2016 1142   GLUCOSEU NEGATIVE 04/06/2016 1142   HGBUR NEGATIVE 04/06/2016 1142   BILIRUBINUR NEGATIVE 04/06/2016 1142   KETONESUR NEGATIVE 04/06/2016 1142   PROTEINUR NEGATIVE 04/06/2016 1142   NITRITE NEGATIVE 04/06/2016 1142   LEUKOCYTESUR TRACE (A) 04/06/2016 1142    Recent Results (from the past 240 hour(s))  MRSA PCR Screening     Status: Abnormal   Collection Time: 03/16/17  3:34 AM  Result Value Ref Range Status   MRSA by PCR INVALID RESULTS, SPECIMEN SENT FOR CULTURE (A) NEGATIVE Final    Comment: RESULT CALLED TO, READ BACK BY AND VERIFIED WITH: MARTIN,J @ 0452 BY  MATTHEWS, B 11.23.18   MRSA culture     Status: None   Collection Time: 03/17/17  3:40 AM  Result Value Ref Range Status   Specimen Description NOSE  Final   Special Requests NONE  Final   Culture   Final    NO MRSA DETECTED Performed at [facility], [address], [place], [place] [address]    Report Status 03/18/2017 FINAL  Final     Radiological Exams on Admission: Dg Chest Portable 1 View  Result Date: 03/22/2017 CLINICAL DATA:  Short of breath EXAM: PORTABLE CHEST 1 VIEW COMPARISON:  03/16/2017, 04/06/2016 FINDINGS: Mild hyperinflation with minimal bronchitic change. No consolidation or effusion. Stable cardiomediastinal silhouette with atherosclerosis. No pneumothorax. IMPRESSION: Hyperinflation with bronchitic changes. Negative for focal infiltrate or edema. Electronically Signed   By: Jasmine Pang M.D.   On: 03/22/2017 02:04     Assessment/Plan 71 yo female with CRF with acute exacerbation of her copd Principal Problem:   Acute on chronic respiratory failure with hypoxia (HCC)- treat copde as below  Active Problems:   COPD with acute exacerbation (HCC)- freq nebs.  Iv solumedrol.  cxr neg.   Diastolic dysfunction- stable   Hypokalemia- replete orally x 1   Acute on chronic respiratory failure (HCC)- seems improved with brief bipap use in ED.       DVT prophylaxis:   scds Code Status:  full Family Communication:  none Disposition Plan:  Per day team Consults called:  none Admission status:   admit   Karanvir Balderston A MD [facility]  If 7PM-7AM, please contact night-coverage [email or web address] Password [facility]  03/22/2017, 7:39 AM

## 2017-03-22 NOTE — Telephone Encounter (Signed)
I attempted to call Dr. Boris LownBeal's office but the number keeps a busy signal X 3. I will try again later.

## 2017-03-22 NOTE — ED Notes (Signed)
Attempted to give report 

## 2017-03-22 NOTE — Progress Notes (Signed)
Pt. ambulated w/assistance to bathroom while on 4 lpm n/c via portable tank prior to starting continuous 1 hour long Albuterol nebulizer tx., tolerated fairly well with only mild distress upon returning to room, oxygen saturations did drop to 86% upon arrival, pt. In minimal distress and able to complete full sentences.

## 2017-03-22 NOTE — Progress Notes (Signed)
Pt. arrived on CPAP with [facility] in moderate distress, placed on V-60 BiPAP per order Dr. Bebe ShaggyWickline, ABG obtained, CXR being done, RT to monitor.

## 2017-03-22 NOTE — Progress Notes (Signed)
This is a no charge note  Pending admission per Dr. Bebe ShaggyWickline  71 year old lady with past medical history of COPD, asthma, dCHF, recent admission due to COPD exacerbation, who presents with worsening shortness of breath. Oxygen desaturated to 77% on room air, negative chest x-ray, clinically consistent with COPD exacerbation. Patient was initially put on BiPAP, improved, now is off BiPAP. Pt is admitted to tele bed as inpt.   Lorretta HarpXilin Aryelle Figg, MD  [facility] Pager [phone]  If 7PM-7AM, please contact night-coverage [email or web address] Password [email or web address] 03/22/2017, 6:30 AM

## 2017-03-22 NOTE — ED Notes (Signed)
Pt ambulated from room to down the hall and back to room start 89% and end 78% informed the nurse oxygen 3L

## 2017-03-22 NOTE — ED Notes (Signed)
ED TO INPATIENT HANDOFF REPORT  Name/Age/Gender Leslie Porter 71 y.o. female  Code Status Code Status History    Date Active Date Inactive Code Status Order ID Comments User Context   03/22/2017 06:47 03/22/2017 06:51 Full Code [id number]  Radene Gunning, NP ED   03/16/2017 03:41 03/18/2017 16:16 DNR [id number]  Reubin Milan, MD Inpatient   03/16/2017 03:33 03/16/2017 03:41 DNR [id number]  Reubin Milan, MD Inpatient   01/12/2016 11:56 01/15/2016 18:28 DNR [id number]  Chesley Mires, MD Inpatient   12/31/2015 21:25 01/12/2016 11:56 Full Code [id number]  Vianne Bulls, MD ED    Advance Directive Documentation     Most Recent Value  Type of Advance Directive  Healthcare Power of [place] (sister) [email or web address]]  Pre-existing out of facility DNR order (yellow form or pink MOST form)  Physician notified to receive inpatient order  "MOST" Form in Place?  No data      Home/SNF/Other Home  Chief Complaint Shortness of Breath  Level of Care/Admitting Diagnosis ED Disposition    ED Disposition Condition Comment   Admit  Hospital Area: [place] [[id number]]  Level of Care: Telemetry [5]  Diagnosis: Acute on chronic respiratory failure [facility]) [[id number]]  Admitting Physician: Morrison Old  Attending Physician: Morrison Old  Estimated length of stay: 3 - 4 days  Certification:: I certify this patient will need inpatient services for at least 2 midnights  PT Class (Do Not Modify): Inpatient [101]  PT Acc Code (Do Not Modify): Private [1]       Medical History Past Medical History:  Diagnosis Date  . Asthma   . COPD (chronic obstructive pulmonary disease) ([place])   . Diastolic dysfunction   . Osteoporosis     Allergies Allergies  Allergen Reactions  . Levofloxacin Shortness Of Breath    IV Location/Drains/Wounds Patient Lines/Drains/Airways Status   Active Line/Drains/Airways    Name:   Placement date:    Placement time:   Site:   Days:   Peripheral IV 03/22/17 Right Hand   03/22/17    0145    Hand   less than 1          Labs/Imaging Results for orders placed or performed during the hospital encounter of 03/22/17 (from the past 48 hour(s))  Basic metabolic panel     Status: Abnormal   Collection Time: 03/22/17  1:36 AM  Result Value Ref Range   Sodium 134 (L) 135 - 145 mmol/L   Potassium 3.3 (L) 3.5 - 5.1 mmol/L   Chloride 95 (L) 101 - 111 mmol/L   CO2 29 22 - 32 mmol/L   Glucose, Bld 110 (H) 65 - 99 mg/dL   BUN 18 6 - 20 mg/dL   Creatinine, Ser 0.55 0.44 - 1.00 mg/dL   Calcium 8.5 (L) 8.9 - 10.3 mg/dL   GFR calc non Af Amer >60 >60 mL/min   GFR calc Af Amer >60 >60 mL/min    Comment: (NOTE) The eGFR has been calculated using the CKD EPI equation. This calculation has not been validated in all clinical situations. eGFR's persistently <60 mL/min signify possible Chronic Kidney Disease.    Anion gap 10 5 - 15  CBC with Differential/Platelet     Status: Abnormal   Collection Time: 03/22/17  1:36 AM  Result Value Ref Range   WBC 15.9 (H) 4.0 - 10.5 K/uL   RBC 4.22 3.87 - 5.11 MIL/uL   Hemoglobin 14.5 12.0 - 15.0 g/dL  HCT 42.8 36.0 - 46.0 %   MCV 101.4 (H) 78.0 - 100.0 fL   MCH 34.4 (H) 26.0 - 34.0 pg   MCHC 33.9 30.0 - 36.0 g/dL   RDW 12.5 11.5 - 15.5 %   Platelets 315 150 - 400 K/uL   Neutrophils Relative % 85 %   Neutro Abs 13.6 (H) 1.7 - 7.7 K/uL   Lymphocytes Relative 7 %   Lymphs Abs 1.1 0.7 - 4.0 K/uL   Monocytes Relative 6 %   Monocytes Absolute 0.9 0.1 - 1.0 K/uL   Eosinophils Relative 2 %   Eosinophils Absolute 0.3 0.0 - 0.7 K/uL   Basophils Relative 0 %   Basophils Absolute 0.0 0.0 - 0.1 K/uL  Blood gas, arterial     Status: Abnormal   Collection Time: 03/22/17  1:36 AM  Result Value Ref Range   FIO2 100.00    Delivery systems BILEVEL POSITIVE AIRWAY PRESSURE    Inspiratory PAP 12    Expiratory PAP 6    pH, Arterial 7.429 7.350 - 7.450   pCO2 arterial  44.9 32.0 - 48.0 mmHg   pO2, Arterial 163 (H) 83.0 - 108.0 mmHg   Bicarbonate 29.2 (H) 20.0 - 28.0 mmol/L   Acid-Base Excess 4.6 (H) 0.0 - 2.0 mmol/L   O2 Saturation 98.7 %   Patient temperature 98.6    Collection site LEFT RADIAL    Drawn by [id number]    Sample type ARTERIAL    Allens test (pass/fail) PASS PASS   Dg Chest Portable 1 View  Result Date: 03/22/2017 CLINICAL DATA:  Short of breath EXAM: PORTABLE CHEST 1 VIEW COMPARISON:  03/16/2017, 04/06/2016 FINDINGS: Mild hyperinflation with minimal bronchitic change. No consolidation or effusion. Stable cardiomediastinal silhouette with atherosclerosis. No pneumothorax. IMPRESSION: Hyperinflation with bronchitic changes. Negative for focal infiltrate or edema. Electronically Signed   By: Donavan Foil M.D.   On: 03/22/2017 02:04    Pending Labs Unresulted Labs (From admission, onward)   Start     Ordered   Signed and Occupational hygienist morning,   R     Signed and Held   Signed and Held  CBC  Tomorrow morning,   R     Signed and Held      Vitals/Pain Today's Vitals   03/22/17 0921 03/22/17 0945 03/22/17 1030 03/22/17 1043  BP:  (!) 107/48 109/71 109/71  Pulse: 95   (!) 110  Resp: (!) 31 (!) 29 19 (!) 30  Temp:      TempSrc:      SpO2: 94% 94% 94% 91%  Weight:      Height:        Isolation Precautions No active isolations  Medications Medications  dextromethorphan-guaiFENesin (MUCINEX DM) 30-600 MG per 12 hr tablet 1 tablet (1 tablet Oral Given 03/22/17 1030)  methylPREDNISolone sodium succinate (SOLU-MEDROL) 125 mg/2 mL injection 125 mg (125 mg Intravenous Given 03/22/17 0157)  albuterol (PROVENTIL,VENTOLIN) solution continuous neb (10 mg/hr Nebulization Given 03/22/17 0415)  potassium chloride SA (K-DUR,KLOR-CON) CR tablet 40 mEq (40 mEq Oral Given 03/22/17 0821)    Mobility walks

## 2017-03-22 NOTE — ED Provider Notes (Signed)
[facility]-EMERGENCY DEPT Provider Note   CSN: [id number] Arrival date & time: 03/22/17  0120     History   Chief Complaint Chief Complaint  Patient presents with  . Shortness of Breath  Level 5 caveat due to acuity of condition  HPI Leslie Porter is a 71 y.o. female.  The history is provided by the patient and the EMS personnel. The history is limited by the condition of the patient.  Shortness of Breath  This is a new problem. The problem occurs continuously.The problem has been rapidly worsening. Associated symptoms include cough and wheezing. Associated medical issues include COPD.   Patient with history of COPD presents via EMS in respiratory distress Per EMS patient began having wheezing earlier tonight On their arrival patient was noted to be tachypneic and hypoxic She was given albuterol and was placed on CPAP Patient began to improve with treatment No other  details noted at this time Past Medical History:  Diagnosis Date  . Asthma   . COPD (chronic obstructive pulmonary disease) (HCC)   . Diastolic dysfunction   . Osteoporosis     Patient Active Problem List   Diagnosis Date Noted  . Acute respiratory failure (HCC) 03/16/2017  . Elevated MCV 03/16/2017  . Diastolic dysfunction 03/16/2017  . Acute on chronic respiratory failure with hypoxia and hypercapnia (HCC) 03/16/2017  . Nicotine abuse 03/16/2017  . COPD exacerbation (HCC)   . Ex-cigarette smoker 10/04/2016  . Palliative care encounter   . Goals of care, counseling/discussion   . DNR (do not resuscitate) discussion   . Acute hypoxemic respiratory failure (HCC) 12/31/2015  . COPD with acute exacerbation (HCC) 12/31/2015  . CAP (community acquired pneumonia) 12/31/2015  . Respiratory failure (HCC) 12/31/2015  . COPD mixed type (HCC) 06/24/2015  . Dyspnea 06/24/2015    Past Surgical History:  Procedure Laterality Date  . VAGINAL HYSTERECTOMY      OB History    No data  available       Home Medications    Prior to Admission medications   Medication Sig Start Date End Date Taking? Authorizing Provider  albuterol (PROVENTIL HFA;VENTOLIN HFA) 108 (90 Base) MCG/ACT inhaler Inhale 1-2 puffs into the lungs every 6 (six) hours as needed for wheezing or shortness of breath.    [provider]  alendronate (FOSAMAX) 70 MG tablet Take 70 mg by mouth every Sunday.  12/29/15   [provider]  budesonide-formoterol (SYMBICORT) 160-4.5 MCG/ACT inhaler Inhale 2 puffs into the lungs 2 (two) times daily.    [provider]  guaiFENesin (MUCINEX) 600 MG 12 hr tablet Take 1 tablet (600 mg total) by mouth 2 (two) times daily as needed for cough or to loosen phlegm. 01/15/16   Mikhail, Nita Sells, DO  ipratropium-albuterol (DUONEB) 0.5-2.5 (3) MG/3ML SOLN Take 3 mLs by nebulization 2 (two) times daily.    [provider]  montelukast (SINGULAIR) 10 MG tablet Take 10 mg by mouth at bedtime.    [provider]  Multiple Vitamin (MULTIVITAMIN) capsule Take 1 capsule by mouth daily.    [provider]  predniSONE (DELTASONE) 10 MG tablet Take 6 tablets (60 mg total) by mouth daily with breakfast. And decrease by one tablet every 2 days 03/19/17   Catarina Hartshorn, MD  Respiratory Therapy Supplies (FLUTTER) DEVI Use device as directed 10/04/16   Michele Mcalpine, MD  umeclidinium bromide (INCRUSE ELLIPTA) 62.5 MCG/INH AEPB Inhale 1 puff into the lungs daily. 11/01/16   Alroy Dust  M, MD    Family History Family History  Problem Relation Age of Onset  . Heart attack Father   . Heart disease Mother     Social History Social History   Tobacco Use  . Smoking status: Former Smoker    Years: 55.00    Types: Cigarettes    Last attempt to quit: 12/25/2015    Years since quitting: 1.2  . Smokeless tobacco: Never Used  Substance Use Topics  . Alcohol use: Yes    Alcohol/week: 0.0 oz    Comment: Occasional  . Drug use: No      Allergies   Levofloxacin   Review of Systems Review of Systems  Unable to perform ROS: Acuity of condition  Respiratory: Positive for cough, shortness of breath and wheezing.      Physical Exam Updated Vital Signs BP 134/80   Pulse (!) 136   Temp 98 F (36.7 C) (Axillary)   Resp (!) 32   Ht 1.6 m (5\' 3" )   Wt 52.2 kg (115 lb)   SpO2 100%   BMI 20.37 kg/m   Physical Exam  CONSTITUTIONAL: Elderly, distress noted HEAD: Normocephalic/atraumatic EYES: EOMI/PERRL ENMT: Mucous membranes moist, CPAP mask in place NECK: supple no meningeal signs SPINE/BACK:entire spine nontender, kyphotic spine ZO:XWRUEAVCV:distant Heart sounds noted LUNGS: Wheezing noted bilaterally, distress noted ABDOMEN: soft, nontender GU:no cva tenderness NEURO: Pt is awake/alert/appropriate, moves all extremitiesx4.  No facial droop.   EXTREMITIES: pulses normal/equal, full ROM SKIN: warm, color normal PSYCH: anxious ED Treatments / Results  Labs (all labs ordered are listed, but only abnormal results are displayed) Labs Reviewed  BASIC METABOLIC PANEL - Abnormal; Notable for the following components:      Result Value   Sodium 134 (*)    Potassium 3.3 (*)    Chloride 95 (*)    Glucose, Bld 110 (*)    Calcium 8.5 (*)    All other components within normal limits  CBC WITH DIFFERENTIAL/PLATELET - Abnormal; Notable for the following components:   WBC 15.9 (*)    MCV 101.4 (*)    MCH 34.4 (*)    Neutro Abs 13.6 (*)    All other components within normal limits  BLOOD GAS, ARTERIAL - Abnormal; Notable for the following components:   pO2, Arterial 163 (*)    Bicarbonate 29.2 (*)    Acid-Base Excess 4.6 (*)    All other components within normal limits    EKG  EKG Interpretation  Date/Time:  Wednesday March 22 2017 01:45:56 EST Ventricular Rate:  135 PR Interval:    QRS Duration: 82 QT Interval:  296 QTC Calculation: 442 R Axis:   95 Text Interpretation:  Sinus tachycardia Probable  left atrial enlargement Right axis deviation Interpretation limited secondary to artifact Abnormal ekg Confirmed by Zadie RhineWickline, Darvis Croft ([id number]) on 03/22/2017 2:11:16 AM       Radiology Dg Chest Portable 1 View  Result Date: 03/22/2017 CLINICAL DATA:  Short of breath EXAM: PORTABLE CHEST 1 VIEW COMPARISON:  03/16/2017, 04/06/2016 FINDINGS: Mild hyperinflation with minimal bronchitic change. No consolidation or effusion. Stable cardiomediastinal silhouette with atherosclerosis. No pneumothorax. IMPRESSION: Hyperinflation with bronchitic changes. Negative for focal infiltrate or edema. Electronically Signed   By: Jasmine PangKim  Fujinaga M.D.   On: 03/22/2017 02:04    Procedures Procedures CRITICAL CARE Performed by: Joya Gaskinsonald W Mckenzy Salazar Total critical care time: 35 minutes Critical care time was exclusive of separately billable procedures and treating other patients. Critical care was necessary to treat or prevent  imminent or life-threatening deterioration. Critical care was time spent personally by me on the following activities: development of treatment plan with patient and/or surrogate as well as nursing, discussions with consultants, evaluation of patient's response to treatment, examination of patient, obtaining history from patient or surrogate, ordering and performing treatments and interventions, ordering and review of laboratory studies, ordering and review of radiographic studies, pulse oximetry and re-evaluation of patient's condition. She required BiPAP, required continuous albuterol therapy, and multiple re-evaluations while in emergency department  Medications Ordered in ED Medications  ipratropium (ATROVENT) nebulizer solution 0.5 mg (not administered)  levalbuterol (XOPENEX) nebulizer solution 1.25 mg (not administered)  dextromethorphan-guaiFENesin (MUCINEX DM) 30-600 MG per 12 hr tablet 1 tablet (not administered)  methylPREDNISolone sodium succinate (SOLU-MEDROL) 125 mg/2 mL injection 125  mg (125 mg Intravenous Given 03/22/17 0157)  albuterol (PROVENTIL,VENTOLIN) solution continuous neb (10 mg/hr Nebulization Given 03/22/17 0415)     Initial Impression / Assessment and Plan / ED Course  I have reviewed the triage vital signs and the nursing notes.  Pertinent labs & imaging results that were available during my care of the patient were reviewed by me and considered in my medical decision making (see chart for details).     1:29 AM Patient with known history of COPD with recent admission presents today with acute shortness of breath due to COPD To be placed on BiPAP and monitored here in the emergency department 3:32 AM Patient is overall improved while on BiPAP Chest x-ray is negative We will do a trial off BiPAP 4:08 AM Feeling improved She is doing well off BiPAP She continues to have wheeze Will give hour-long neb treatment and reassess 6:20 AM Overall patient is improved, she is now on nasal cannula. her work of breathing is improved However when patient ambulates she became tachypneic and hypoxic, while using oxygen She is not currently at her baseline He would need to be readmitted for further therapy and treatment of her COPD  This was discussed with Dr. Clyde LundborgNiu with hospitalist and she will be admitted Final Clinical Impressions(s) / ED Diagnoses   Final diagnoses:  COPD exacerbation (HCC)  Acute respiratory failure with hypoxia [facility](HCC)    ED Discharge Orders    None       Zadie RhineWickline, Nadalee Neiswender, MD 03/22/17 [phone]

## 2017-03-22 NOTE — ED Notes (Signed)
Report given to [facility] [id number].

## 2017-03-22 NOTE — ED Notes (Signed)
Bed: RESA Expected date:  Expected time:  Means of arrival:  Comments: CPAP 

## 2017-03-22 NOTE — ED Notes (Signed)
Pt sats were low after ambulation. O2 was increased to 5L initially to raise sats. O2 is now set at 3 which is her home dose and she is maintaining 92%. Pt CAox4 throughout.

## 2017-03-22 NOTE — ED Triage Notes (Signed)
Per EMS[facility]  Hx of COPD.  On CPAP on arrival.

## 2017-03-22 NOTE — Care Management Note (Signed)
Case Management Note  Patient Details  Name: Leslie Porter MRN: [id number] Date of Birth: 05/03/1945  Subjective/Objective:                  71 y.o. female with medical history significant of advanced copd on 3 liters  at home, diastolic dysfunction comes in with several days of worsening sob and wheezing.  Action/Plan: CM noted pt was active with [facility].  Note from 11/24 shows pt was given [place] [facility] contact information.  Per Chales AbrahamsMary Ann [facility], pt has NOT contacted them for services.  Pt may need a palliative consult during admission for referral.  CM will continue to follow.  Expected Discharge Date:  (unknown)               Expected Discharge Plan:   Unknown  Discharge planning Services  CM Consult  Post Acute Care Choice:  Durable Medical Equipment Choice offered to:  Patient  DME Arranged:  Oxygen, Nebulizer machine DME Agency:  [facility]  Status of Service:  In process, will continue to follow  Rica KoyanagiKritzer, Levern Kalka N, RN 03/22/2017, 11:30 AM

## 2017-03-23 DIAGNOSIS — J9621 Acute and chronic respiratory failure with hypoxia: Secondary | ICD-10-CM | POA: Diagnosis not present

## 2017-03-23 DIAGNOSIS — J441 Chronic obstructive pulmonary disease with (acute) exacerbation: Secondary | ICD-10-CM | POA: Diagnosis not present

## 2017-03-23 DIAGNOSIS — K219 Gastro-esophageal reflux disease without esophagitis: Secondary | ICD-10-CM | POA: Diagnosis not present

## 2017-03-23 DIAGNOSIS — E876 Hypokalemia: Secondary | ICD-10-CM | POA: Diagnosis not present

## 2017-03-23 LAB — BASIC METABOLIC PANEL
Anion gap: 3 — ABNORMAL LOW (ref 5–15)
BUN: 11 mg/dL (ref 6–20)
CO2: 30 mmol/L (ref 22–32)
Calcium: 8.2 mg/dL — ABNORMAL LOW (ref 8.9–10.3)
Chloride: 104 mmol/L (ref 101–111)
Creatinine, Ser: 0.38 mg/dL — ABNORMAL LOW (ref 0.44–1.00)
GFR calc Af Amer: >60 mL/min (ref >60)
GFR calc non Af Amer: >60 mL/min (ref >60)
Glucose, Bld: 130 mg/dL — ABNORMAL HIGH (ref 65–99)
Potassium: 4.2 mmol/L (ref 3.5–5.1)
Sodium: 137 mmol/L (ref 135–145)

## 2017-03-23 LAB — CBC
HCT: 39.8 % (ref 36.0–46.0)
HEMOGLOBIN: 13 g/dL (ref 12.0–15.0)
MCH: 33.2 pg (ref 26.0–34.0)
MCHC: 32.7 g/dL (ref 30.0–36.0)
MCV: 101.5 fL — AB (ref 78.0–100.0)
Platelets: 320 10*3/uL (ref 150–400)
RBC: 3.92 MIL/uL (ref 3.87–5.11)
RDW: 12.4 % (ref 11.5–15.5)
WBC: 10.9 10*3/uL — ABNORMAL HIGH (ref 4.0–10.5)

## 2017-03-23 MED ORDER — CEFDINIR 300 MG PO CAPS: 300.0000 mg | ORAL_CAPSULE | Freq: Two times a day (BID) | ORAL | 0 refills | Status: DC

## 2017-03-23 MED ORDER — IPRATROPIUM-ALBUTEROL 0.5-2.5 (3) MG/3ML IN SOLN: 3.0000 mL | Freq: Three times a day (TID) | RESPIRATORY_TRACT | Status: DC

## 2017-03-23 MED ORDER — PREDNISONE 20 MG PO TABS: ORAL_TABLET | ORAL | 0 refills | Status: DC

## 2017-03-23 NOTE — Discharge Summary (Signed)
Physician Discharge Summary  Leslie Porter ZOX:[id number] DOB: 1945-08-25 DOA: 03/22/2017  PCP: [name], MD  Admit date: 03/22/2017 Discharge date: 03/23/2017  Time spent: 35 minutes  Recommendations for Outpatient Follow-up:  1. Repeat CBC to follow WBCs 2. Please reevaluate oxygen needs made further adjustment as needed 3. Arrange follow-up with our pulmonary service as an outpatient to further assess patient COPD condition and adjust long-term regimen.   Discharge Diagnoses:  Principal Problem:   Acute on chronic respiratory failure with hypoxia and hypercapnia (HCC) Active Problems:   COPD with acute exacerbation (HCC)   DNR (do not resuscitate) discussion   Diastolic dysfunction   Hypokalemia   Acute on chronic respiratory failure (HCC)   Gastroesophageal reflux disease   Discharge Condition: Stable and improved.  Patient has been discharged home with instructions to follow-up with PCP in 10 days.  Diet recommendation: Heart healthy diet.  Filed Weights   03/22/17 0159 03/22/17 1203  Weight: 52.2 kg (115 lb) 52.2 kg (115 lb 1.3 oz)    History of present illness:  71 y.o. female with medical history significant of advanced copd on 3 liters [place] at home, diastolic dysfunction comes in with several days of worsening sob and wheezing.  Pt was hospitalized last week and sent home on a 2 week prednisone taper had no pna at that time.  She does not smoke but continues to vap.  She denies fever.  Wheezing a lot and sob.  She reports compliance with her medications.  No n/v/d.  Pt  Was on bipap in ED and this has been weaned off down to her 3 liters of oxygen and she is improved.  Pt referred for admission for copde   Hospital Course:  Acute on chronic respiratory failure with hypoxia: She will require transient BiPAP usage for couple hours prior to admission. -Her condition appears to be secondary to COPD exacerbation -Patient responded well to IV steroids,  antibiotics, nebulizer treatment and supportive care -It was proven the need of 3 L of oxygen supplementation 24/7. -Patient has been discharged on prednisone tapering, continue use of Symbicort, as needed duo nebs and also oxygen arrangement to continue using 3 L supplementation. -Patient will benefit of follow-up with pulmonary service as an outpatient. -Will resume Singulair.  Leukocytosis -Appears to be secondary to the use of steroids -Antibiotics will be added for bronchiectasis component -Please repeat CBC to follow WBCs trend.  GERD/GI prophylaxis while he is on prednisone. -Will continue the use of over-the-counter PPI as needed.  History of osteoporosis -Continue Fosamax  Procedures:  See below for x-ray reports   Consultations:  None   Discharge Exam: Vitals:   03/23/17 0700 03/23/17 0758  BP: 132/75   Pulse: 100   Resp: (!) 22   Temp: 98 F (36.7 C)   SpO2: 98% 95%    General: afebrile, no CP, reports breathing is better and overall improved, currently no wheezing, speaking in full sentences and maintaining O2 saturation on 3L [place] (at rest and activities).  Cardiovascular: S1 and S2. No rubs, no gallops Respiratory: improved air movement, no wheezing, no crackles  Abd: soft, NT, ND, positive BS Extremities: no edema, no cyanosis, no clubbing   Discharge Instructions   Discharge Instructions    Discharge instructions   Complete by:  As directed    Take medications as prescribed  Please follow up with PCP in 10 days Keep yourself well hydrated Use flutter valve and oxygen as instructed Don't forget to use  a humidifier and avoid prolonged exposure to cold weather.     Allergies as of 03/23/2017      Reactions   Levofloxacin Shortness Of Breath      Medication List    STOP taking these medications   budesonide 0.5 MG/2ML nebulizer solution Commonly known as:  PULMICORT     TAKE these medications   albuterol 108 (90 Base) MCG/ACT  inhaler Commonly known as:  PROVENTIL HFA;VENTOLIN HFA Inhale 1-2 puffs into the lungs every 6 (six) hours as needed for wheezing or shortness of breath.   alendronate 70 MG tablet Commonly known as:  FOSAMAX Take 70 mg by mouth every Sunday.   budesonide-formoterol 160-4.5 MCG/ACT inhaler Commonly known as:  SYMBICORT Inhale 2 puffs into the lungs 2 (two) times daily.   cefdinir 300 MG capsule Commonly known as:  OMNICEF Take 1 capsule (300 mg total) by mouth 2 (two) times daily.   FLUTTER Devi Use device as directed   guaiFENesin 600 MG 12 hr tablet Commonly known as:  MUCINEX Take 1 tablet (600 mg total) by mouth 2 (two) times daily as needed for cough or to loosen phlegm.   ipratropium-albuterol 0.5-2.5 (3) MG/3ML Soln Commonly known as:  DUONEB Take 3 mLs by nebulization 2 (two) times daily.   montelukast 10 MG tablet Commonly known as:  SINGULAIR Take 10 mg by mouth at bedtime.   multivitamin capsule Take 1 capsule by mouth daily.   predniSONE 20 MG tablet Commonly known as:  DELTASONE Take 3 tablets by mouth daily X 2 days; then 2 tablets by mouth daily X 2 days; then 1 tablet by mouth daily X 3 days; then 1/2 tablet by mouth daily X 3 days and stop prednisone. What changed:    medication strength  how much to take  how to take this  when to take this  additional instructions   umeclidinium bromide 62.5 MCG/INH Aepb Commonly known as:  INCRUSE ELLIPTA Inhale 1 puff into the lungs daily.            Durable Medical Equipment  (From admission, onward)        Start     Ordered   03/23/17 1503  For home use only DME oxygen  Once    Question Answer Comment  Mode or (Route) Nasal cannula   Liters per Minute 3   Frequency Continuous (stationary and portable oxygen unit needed)   Oxygen conserving device Yes   Oxygen delivery system Gas      11 /29/18 1502     Allergies  Allergen Reactions  . Levofloxacin Shortness Of Breath   Follow-up  Information    Eartha InchBadger, Michael C, MD. Schedule an appointment as soon as possible for a visit in 10 day(s).   Specialty:  Family Medicine Contact information: [address] [place] [address] [phone]           The results of significant diagnostics from this hospitalization (including imaging, microbiology, ancillary and laboratory) are listed below for reference.    Significant Diagnostic Studies: Dg Chest Portable 1 View  Result Date: 03/22/2017 CLINICAL DATA:  Short of breath EXAM: PORTABLE CHEST 1 VIEW COMPARISON:  03/16/2017, 04/06/2016 FINDINGS: Mild hyperinflation with minimal bronchitic change. No consolidation or effusion. Stable cardiomediastinal silhouette with atherosclerosis. No pneumothorax. IMPRESSION: Hyperinflation with bronchitic changes. Negative for focal infiltrate or edema. Electronically Signed   By: Jasmine PangKim  Fujinaga M.D.   On: 03/22/2017 02:04   [facility]  Result  Date: 03/16/2017 CLINICAL DATA:  Shortness of breath since Monday, increasing tonight. History of asthma, COPD, former smoker. EXAM: PORTABLE CHEST 1 VIEW COMPARISON:  04/06/2016 FINDINGS: Normal heart size and pulmonary vascularity. Lungs appear clear and expanded. Emphysematous changes in the lungs with scattered fibrosis and peribronchial thickening suggesting chronic bronchitis. No blunting of costophrenic angles. No pneumothorax. Calcification of the aorta. 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 No evidence of active pulmonary disease. Aortic atherosclerosis. Electronically Signed   By: Burman NievesWilliam  Stevens M.D.   On: 03/16/2017 02:16    Microbiology: Recent Results (from the past 240 hour(s))  MRSA PCR Screening     Status: Abnormal   Collection Time: 03/16/17  3:34 AM  Result Value Ref Range Status   MRSA by PCR INVALID RESULTS, SPECIMEN SENT FOR CULTURE (A) NEGATIVE Final    Comment: RESULT CALLED TO, READ BACK BY AND VERIFIED WITH: MARTIN,J @  0452 BY MATTHEWS, B 11.23.18   MRSA culture     Status: None   Collection Time: 03/17/17  3:40 AM  Result Value Ref Range Status   Specimen Description NOSE  Final   Special Requests NONE  Final   Culture   Final    NO MRSA DETECTED Performed at [facility], [address] [place], [place] [address]    Report Status 03/18/2017 FINAL  Final     Labs: Basic Metabolic Panel: Recent Labs  Lab 03/17/17 0504 03/22/17 0136 03/23/17 0410  NA 137 134* 137  K 3.8 3.3* 4.2  CL 102 95* 104  CO2 28 29 30   GLUCOSE 134* 110* 130*  BUN 12 18 11   CREATININE 0.43* 0.55 0.38*  CALCIUM 8.4* 8.5* 8.2*  MG 2.3  --   --    CBC: Recent Labs  Lab 03/17/17 0504 03/22/17 0136 03/23/17 0410  WBC 13.0* 15.9* 10.9*  NEUTROABS 11.5* 13.6*  --   HGB 13.4 14.5 13.0  HCT 42.0 42.8 39.8  MCV 104.5* 101.4* 101.5*  PLT 347 315 320   BNP (last 3 results) Recent Labs    03/16/17 0211  BNP 159.0*    Signed:  Vassie Lollarlos Siena Poehler MD.  [facility] 03/23/2017, 3:10 PM

## 2017-03-23 NOTE — Progress Notes (Addendum)
Pt will discharge home with Home O2 from [facility]. Pt was with [facility] who no longer service her area. [facility] called [facility] to take over her account. This was comformed by called [facility] [phone].  Will need O2 Sats and order for home O2. Pt is aware of [facility] and [facility].

## 2017-03-23 NOTE — Progress Notes (Signed)
SATURATION QUALIFICATIONS: (This note is used to comply with regulatory documentation for home oxygen)  Patient Saturations on Room Air at Rest = 95%  Patient Saturations on Room Air while Ambulating = 87%  Patient Saturations on 3 Liters of oxygen while Ambulating = 91%  Please briefly explain why patient needs home oxygen: patient's o2 saturation drops while ambulating on room air and becomes short of breath.

## 2017-05-12 ENCOUNTER — Telehealth: Payer: Self-pay | Admitting: Pulmonary Disease

## 2017-05-12 NOTE — Telephone Encounter (Signed)
Spoke with pt, she states she ended up in the hospital a month ago, her O2 was ok but she couldn't breathe. The hospital gave her a CPAP and this helped her with her breathing. Her PCP suggested she get another sleep study. Pt states she had ONO and but not a sleep study. She feels she benefited from the CPAP in the hospital. SN can we order sleep study? Does she need an OV? Please advise.

## 2017-05-12 NOTE — Telephone Encounter (Signed)
Per SN:\  Advise pt she would have to RTC for OV to discuss sleep test. She has missed the last two appts and her last OV was on 10/04/2016.   Called pt and advised message from the provider. Pt understood and verbalized understanding. Nothing further is needed.  She stated she has missed her appts because she is tired of going to doctors.

## 2017-06-03 ENCOUNTER — Other Ambulatory Visit: Payer: Self-pay

## 2017-06-03 ENCOUNTER — Emergency Department (HOSPITAL_COMMUNITY): Payer: Medicare Other

## 2017-06-03 ENCOUNTER — Encounter (HOSPITAL_COMMUNITY): Payer: Self-pay | Admitting: Cardiology

## 2017-06-03 ENCOUNTER — Observation Stay (HOSPITAL_COMMUNITY)
Admission: EM | Admit: 2017-06-03 | Discharge: 2017-06-05 | Disposition: A | Discharge status: Home / Self Care | Payer: Medicare Other | Attending: Internal Medicine | Admitting: Internal Medicine

## 2017-06-03 DIAGNOSIS — Z66 Do not resuscitate: Secondary | ICD-10-CM | POA: Insufficient documentation

## 2017-06-03 DIAGNOSIS — J441 Chronic obstructive pulmonary disease with (acute) exacerbation: Secondary | ICD-10-CM | POA: Diagnosis not present

## 2017-06-03 DIAGNOSIS — Z79899 Other long term (current) drug therapy: Secondary | ICD-10-CM | POA: Diagnosis not present

## 2017-06-03 DIAGNOSIS — Z87891 Personal history of nicotine dependence: Secondary | ICD-10-CM | POA: Insufficient documentation

## 2017-06-03 DIAGNOSIS — J45909 Unspecified asthma, uncomplicated: Secondary | ICD-10-CM | POA: Insufficient documentation

## 2017-06-03 DIAGNOSIS — J9621 Acute and chronic respiratory failure with hypoxia: Secondary | ICD-10-CM | POA: Diagnosis not present

## 2017-06-03 DIAGNOSIS — I5030 Unspecified diastolic (congestive) heart failure: Secondary | ICD-10-CM | POA: Diagnosis not present

## 2017-06-03 DIAGNOSIS — R0602 Shortness of breath: Secondary | ICD-10-CM | POA: Diagnosis present

## 2017-06-03 DIAGNOSIS — Z7189 Other specified counseling: Secondary | ICD-10-CM

## 2017-06-03 LAB — COMPREHENSIVE METABOLIC PANEL
ALBUMIN: 4.1 g/dL (ref 3.5–5.0)
ALK PHOS: 54 U/L (ref 38–126)
ALT: 19 U/L (ref 14–54)
ANION GAP: 10 (ref 5–15)
AST: 19 U/L (ref 15–41)
BILIRUBIN TOTAL: 0.4 mg/dL (ref 0.3–1.2)
BUN: 19 mg/dL (ref 6–20)
CO2: 27 mmol/L (ref 22–32)
CREATININE: 0.54 mg/dL (ref 0.44–1.00)
Calcium: 8.8 mg/dL — ABNORMAL LOW (ref 8.9–10.3)
Chloride: 103 mmol/L (ref 101–111)
GFR calc Af Amer: >60 mL/min (ref >60)
GFR calc non Af Amer: >60 mL/min (ref >60)
GLUCOSE: 114 mg/dL — AB (ref 65–99)
Potassium: 4.2 mmol/L (ref 3.5–5.1)
Sodium: 140 mmol/L (ref 135–145)
TOTAL PROTEIN: 7.2 g/dL (ref 6.5–8.1)

## 2017-06-03 LAB — CBC WITH DIFFERENTIAL/PLATELET
BASOS ABS: 0 10*3/uL (ref 0.0–0.1)
BASOS PCT: 0 %
EOS ABS: 0.1 10*3/uL (ref 0.0–0.7)
EOS PCT: 0 %
HEMATOCRIT: 42.2 % (ref 36.0–46.0)
Hemoglobin: 13.3 g/dL (ref 12.0–15.0)
Lymphocytes Relative: 5 %
Lymphs Abs: 0.7 10*3/uL (ref 0.7–4.0)
MCH: 32 pg (ref 26.0–34.0)
MCHC: 31.5 g/dL (ref 30.0–36.0)
MCV: 101.7 fL — ABNORMAL HIGH (ref 78.0–100.0)
MONO ABS: 0.4 10*3/uL (ref 0.1–1.0)
MONOS PCT: 3 %
NEUTROS ABS: 11.4 10*3/uL — AB (ref 1.7–7.7)
Neutrophils Relative %: 92 %
PLATELETS: 372 10*3/uL (ref 150–400)
RBC: 4.15 MIL/uL (ref 3.87–5.11)
RDW: 12.7 % (ref 11.5–15.5)
WBC: 12.5 10*3/uL — ABNORMAL HIGH (ref 4.0–10.5)

## 2017-06-03 MED ORDER — IPRATROPIUM-ALBUTEROL 0.5-2.5 (3) MG/3ML IN SOLN: 3.0000 mL | Freq: Once | RESPIRATORY_TRACT | Status: DC

## 2017-06-03 MED ORDER — IPRATROPIUM-ALBUTEROL 0.5-2.5 (3) MG/3ML IN SOLN
3.0000 mL | Freq: Once | RESPIRATORY_TRACT | Status: DC
  Administered 2017-06-03: 3 mL via RESPIRATORY_TRACT
  Filled 2017-06-03: qty 3

## 2017-06-03 MED ORDER — SODIUM CHLORIDE 0.9 % IV SOLN
INTRAVENOUS | Status: DC
  Administered 2017-06-03: 22:00:00 via INTRAVENOUS

## 2017-06-03 NOTE — ED Triage Notes (Signed)
EMS called out for SOB.  Initial sats 95% on 3 liters.  EMS gave 125 mg solumedol,   2 albuterals and 1 atrovent.  Pt Sinus tach 128.  Pt on c pap upon arrival.  States she feels better.

## 2017-06-03 NOTE — Progress Notes (Signed)
Pt placed on 3L O2 for trial off BIPAP per MD. Tinnie GensJeffrey, RN aware

## 2017-06-03 NOTE — ED Notes (Signed)
Lab, Leslie Porter is aware that the pt's blood needs to be drawn and is on her way back there to get it now.

## 2017-06-03 NOTE — ED Notes (Signed)
During Triage, Pt reports she would refuse Blood Transfusions if they could save her life and Pt wishes to have her code Status set to: DNR

## 2017-06-04 ENCOUNTER — Other Ambulatory Visit: Payer: Self-pay

## 2017-06-04 ENCOUNTER — Encounter (HOSPITAL_COMMUNITY): Payer: Self-pay

## 2017-06-04 DIAGNOSIS — J9621 Acute and chronic respiratory failure with hypoxia: Secondary | ICD-10-CM | POA: Diagnosis not present

## 2017-06-04 DIAGNOSIS — J441 Chronic obstructive pulmonary disease with (acute) exacerbation: Secondary | ICD-10-CM | POA: Diagnosis present

## 2017-06-04 LAB — CBC
HEMATOCRIT: 40 % (ref 36.0–46.0)
HEMOGLOBIN: 12.6 g/dL (ref 12.0–15.0)
MCH: 31.7 pg (ref 26.0–34.0)
MCHC: 31.5 g/dL (ref 30.0–36.0)
MCV: 100.8 fL — ABNORMAL HIGH (ref 78.0–100.0)
Platelets: 406 10*3/uL — ABNORMAL HIGH (ref 150–400)
RBC: 3.97 MIL/uL (ref 3.87–5.11)
RDW: 12.6 % (ref 11.5–15.5)
WBC: 9.5 10*3/uL (ref 4.0–10.5)

## 2017-06-04 LAB — COMPREHENSIVE METABOLIC PANEL
ALBUMIN: 3.7 g/dL (ref 3.5–5.0)
ALK PHOS: 48 U/L (ref 38–126)
ALT: 17 U/L (ref 14–54)
AST: 18 U/L (ref 15–41)
Anion gap: 8 (ref 5–15)
BUN: 12 mg/dL (ref 6–20)
CO2: 26 mmol/L (ref 22–32)
Calcium: 8.4 mg/dL — ABNORMAL LOW (ref 8.9–10.3)
Chloride: 103 mmol/L (ref 101–111)
Creatinine, Ser: 0.47 mg/dL (ref 0.44–1.00)
GFR calc Af Amer: >60 mL/min (ref >60)
GFR calc non Af Amer: >60 mL/min (ref >60)
GLUCOSE: 184 mg/dL — AB (ref 65–99)
POTASSIUM: 3.9 mmol/L (ref 3.5–5.1)
Sodium: 137 mmol/L (ref 135–145)
TOTAL PROTEIN: 6.6 g/dL (ref 6.5–8.1)
Total Bilirubin: 0.4 mg/dL (ref 0.3–1.2)

## 2017-06-04 MED ORDER — MONTELUKAST SODIUM 10 MG PO TABS
10.0000 mg | ORAL_TABLET | Freq: Every day | ORAL | Status: DC
  Administered 2017-06-04: 10 mg via ORAL
  Filled 2017-06-04: qty 1

## 2017-06-04 MED ORDER — SODIUM CHLORIDE 0.9 % IV SOLN
INTRAVENOUS | Status: DC
  Administered 2017-06-04: 03:00:00 via INTRAVENOUS

## 2017-06-04 MED ORDER — GUAIFENESIN ER 600 MG PO TB12: 600.0000 mg | ORAL_TABLET | Freq: Two times a day (BID) | ORAL | Status: DC | PRN

## 2017-06-04 MED ORDER — ONDANSETRON HCL 4 MG/2ML IJ SOLN: 4.0000 mg | Freq: Four times a day (QID) | INTRAMUSCULAR | Status: DC | PRN

## 2017-06-04 MED ORDER — METHYLPREDNISOLONE SODIUM SUCC 40 MG IJ SOLR
40.0000 mg | Freq: Four times a day (QID) | INTRAMUSCULAR | Status: DC
  Administered 2017-06-04 – 2017-06-05 (×6): 40 mg via INTRAVENOUS
  Filled 2017-06-04 (×6): qty 1

## 2017-06-04 MED ORDER — GUAIFENESIN ER 600 MG PO TB12
1200.0000 mg | ORAL_TABLET | Freq: Two times a day (BID) | ORAL | Status: DC
  Administered 2017-06-04 – 2017-06-05 (×4): 1200 mg via ORAL
  Filled 2017-06-04 (×4): qty 2

## 2017-06-04 MED ORDER — MOMETASONE FURO-FORMOTEROL FUM 200-5 MCG/ACT IN AERO
2.0000 | INHALATION_SPRAY | Freq: Two times a day (BID) | RESPIRATORY_TRACT | Status: DC
  Administered 2017-06-04 – 2017-06-05 (×3): 2 via RESPIRATORY_TRACT
  Filled 2017-06-04: qty 8.8

## 2017-06-04 MED ORDER — ENOXAPARIN SODIUM 40 MG/0.4ML ~~LOC~~ SOLN
40.0000 mg | SUBCUTANEOUS | Status: DC
  Filled 2017-06-04 (×2): qty 0.4

## 2017-06-04 MED ORDER — ONDANSETRON HCL 4 MG PO TABS: 4.0000 mg | ORAL_TABLET | Freq: Four times a day (QID) | ORAL | Status: DC | PRN

## 2017-06-04 MED ORDER — IPRATROPIUM-ALBUTEROL 0.5-2.5 (3) MG/3ML IN SOLN
3.0000 mL | Freq: Four times a day (QID) | RESPIRATORY_TRACT | Status: DC
  Administered 2017-06-04 – 2017-06-05 (×5): 3 mL via RESPIRATORY_TRACT
  Filled 2017-06-04 (×5): qty 3

## 2017-06-04 NOTE — ED Provider Notes (Signed)
[facility] EMERGENCY DEPARTMENT Provider Note   CSN: [id number] Arrival date & time: 06/03/17  1942     History   Chief Complaint Chief Complaint  Patient presents with  . Shortness of Breath    HPI Leslie Porter is a 72 y.o. female.  Patient with known history of COPD.  Patient was just put on oxygen for the first time in the fall.  They have had to increase it some.  So she has been having worsening bouts of her COPD.  Brought in by EMS patient was in extremis with her breathing.  She was placed on CPAP they gave HER-2 albuterol's and one Atrovent and they also gave her 25 mg of Solu-Medrol.  Patient is normally on 3 L of oxygen nasal cannula.  Patient upon arrival here switched over to BiPAP.  Patient definitely having respiratory distress.  But was improving some.  Patient states that she just got kind of short of breath over the last couple days and got significantly worse here today and she called EMS.  No flulike symptoms.      Past Medical History:  Diagnosis Date  . Asthma   . COPD (chronic obstructive pulmonary disease) ([place])   . Diastolic dysfunction   . Osteoporosis     Patient Active Problem List   Diagnosis Date Noted  . Gastroesophageal reflux disease   . Hypokalemia 03/22/2017  . Acute on chronic respiratory failure ([place]) 03/22/2017  . Acute respiratory failure ([place]) 03/16/2017  . Elevated MCV 03/16/2017  . Diastolic dysfunction 08/65/7846  . Acute on chronic respiratory failure with hypoxia and hypercapnia ([place]) 03/16/2017  . Nicotine abuse 03/16/2017  . COPD exacerbation ([place])   . Ex-cigarette smoker 10/04/2016  . Palliative care encounter   . Goals of care, counseling/discussion   . DNR (do not resuscitate) discussion   . Acute hypoxemic respiratory failure ([place]) 12/31/2015  . COPD with acute exacerbation ([place]) 12/31/2015  . CAP (community acquired pneumonia) 12/31/2015  . Respiratory failure ([place]) 12/31/2015  . COPD mixed type ([place])  06/24/2015  . Dyspnea 06/24/2015    Past Surgical History:  Procedure Laterality Date  . VAGINAL HYSTERECTOMY      OB History    No data available       Home Medications    Prior to Admission medications   Medication Sig Start Date End Date Taking? Authorizing Provider  albuterol (PROVENTIL HFA;VENTOLIN HFA) 108 (90 Base) MCG/ACT inhaler Inhale 1-2 puffs into the lungs every 6 (six) hours as needed for wheezing or shortness of breath.   Yes [provider]  alendronate (FOSAMAX) 70 MG tablet Take 70 mg by mouth every Sunday.  12/29/15  Yes [provider]  budesonide-formoterol (SYMBICORT) 160-4.5 MCG/ACT inhaler Inhale 2 puffs into the lungs 2 (two) times daily.   Yes [provider]  Calcium Carbonate-Vit D-Min (CALCIUM 1200 PO) Take 1 tablet by mouth daily.   Yes [provider]  guaiFENesin (MUCINEX) 600 MG 12 hr tablet Take 1 tablet (600 mg total) by mouth 2 (two) times daily as needed for cough or to loosen phlegm. 01/15/16  Yes Mikhail, Maryann, DO  ipratropium-albuterol (DUONEB) 0.5-2.5 (3) MG/3ML SOLN Take 3 mLs by nebulization 2 (two) times daily.   Yes [provider]  montelukast (SINGULAIR) 10 MG tablet Take 10 mg by mouth at bedtime.   Yes [provider]  Multiple Vitamin (MULTIVITAMIN) capsule Take 1 capsule by mouth daily.   Yes [provider]  predniSONE (DELTASONE) 20  MG tablet Take 3 tablets by mouth daily X 2 days; then 2 tablets by mouth daily X 2 days; then 1 tablet by mouth daily X 3 days; then 1/2 tablet by mouth daily X 3 days and stop prednisone. 03/23/17  Yes Barton Dubois, MD  Respiratory Therapy Supplies (FLUTTER) DEVI Use device as directed 10/04/16  Yes Noralee Space, MD  Tiotropium Bromide Monohydrate 1.25 MCG/ACT AERS Inhale 2 sprays into the lungs daily. 04/05/17  Yes [provider]  umeclidinium bromide (INCRUSE ELLIPTA) 62.5 MCG/INH AEPB Inhale 1 puff into the lungs daily.  11/01/16  Yes Noralee Space, MD  cefdinir (OMNICEF) 300 MG capsule Take 1 capsule (300 mg total) by mouth 2 (two) times daily. Patient not taking: Reported on 06/03/2017 03/23/17   Barton Dubois, MD    Family History Family History  Problem Relation Age of Onset  . Heart attack Father   . Heart disease Mother     Social History Social History   Tobacco Use  . Smoking status: Former Smoker    Years: 55.00    Types: Cigarettes    Last attempt to quit: 12/25/2015    Years since quitting: 1.4  . Smokeless tobacco: Never Used  Substance Use Topics  . Alcohol use: Yes    Alcohol/week: 0.0 oz    Comment: Occasional  . Drug use: No     Allergies   Levofloxacin   Review of Systems Review of Systems  Constitutional: Negative for fever.  HENT: Negative for congestion.   Eyes: Negative for redness.  Respiratory: Positive for shortness of breath and wheezing.   Cardiovascular: Negative for chest pain.  Gastrointestinal: Negative for abdominal pain.  Genitourinary: Negative for dysuria.  Neurological: Negative for headaches.  Hematological: Does not bruise/bleed easily.  Psychiatric/Behavioral: Negative for confusion.     Physical Exam Updated Vital Signs BP (!) 145/90   Pulse (!) 107   Resp (!) 33   Ht 1.6 m (5' 3" )   Wt 52.2 kg (115 lb)   SpO2 96%   BMI 20.37 kg/m   Physical Exam  Constitutional: She is oriented to person, place, and time. She appears well-developed and well-nourished. She appears distressed.  HENT:  Head: Normocephalic and atraumatic.  Mouth/Throat: Oropharynx is clear and moist.  Eyes: Conjunctivae and EOM are normal. Pupils are equal, round, and reactive to light.  Neck: Normal range of motion. Neck supple.  Cardiovascular: Normal rate, regular rhythm and normal heart sounds.  Pulmonary/Chest: She is in respiratory distress. She has wheezes.  Abdominal: Soft. Bowel sounds are normal. There is no tenderness.  Musculoskeletal: Normal range of  motion. She exhibits no edema.  Neurological: She is alert and oriented to person, place, and time. No cranial nerve deficit or sensory deficit. She exhibits normal muscle tone. Coordination normal.  Skin: Skin is warm. No rash noted.  Nursing note and vitals reviewed.    ED Treatments / Results  Labs (all labs ordered are listed, but only abnormal results are displayed) Labs Reviewed  COMPREHENSIVE METABOLIC PANEL - Abnormal; Notable for the following components:      Result Value   Glucose, Bld 114 (*)    Calcium 8.8 (*)    All other components within normal limits  CBC WITH DIFFERENTIAL/PLATELET - Abnormal; Notable for the following components:   WBC 12.5 (*)    MCV 101.7 (*)    Neutro Abs 11.4 (*)    All other components within normal limits    EKG  EKG Interpretation  Date/Time:  Saturday June 03 2017 19:55:12 EST Ventricular Rate:  120 PR Interval:    QRS Duration: 85 QT Interval:  305 QTC Calculation: 431 R Axis:   92 Text Interpretation:  Sinus tachycardia Consider right atrial enlargement Right axis deviation Confirmed by Fredia Sorrow [id number]) on 06/03/2017 8:03:47 PM       Radiology Dg Chest Port 1 View  Result Date: 06/03/2017 CLINICAL DATA:  Shortness of breath, history COPD, asthma EXAM: PORTABLE CHEST 1 VIEW COMPARISON:  Portable exam 2026 hours compared to 03/22/2017 FINDINGS: Normal heart size, mediastinal contours, and pulmonary vascularity. Atherosclerotic calcification aorta. Emphysematous changes without infiltrate, pleural effusion or pneumothorax. Questionable nodular density versus summation artifact lower RIGHT chest. Bones unremarkable. IMPRESSION: Emphysematous changes without acute infiltrate. Questionable nodular density lower RIGHT chest versus artifact; follow-up upright PA and lateral chest radiographs recommended. Electronically Signed   By: Lavonia Dana M.D.   On: 06/03/2017 20:52    Procedures Procedures (including critical care  time)  CRITICAL CARE Performed by: Fredia Sorrow Total critical care time: 30 minutes Critical care time was exclusive of separately billable procedures and treating other patients. Critical care was necessary to treat or prevent imminent or life-threatening deterioration. Critical care was time spent personally by me on the following activities: development of treatment plan with patient and/or surrogate as well as nursing, discussions with consultants, evaluation of patient's response to treatment, examination of patient, obtaining history from patient or surrogate, ordering and performing treatments and interventions, ordering and review of laboratory studies, ordering and review of radiographic studies, pulse oximetry and re-evaluation of patient's condition.  Medications Ordered in ED Medications  0.9 %  sodium chloride infusion ( Intravenous New Bag/Given 06/03/17 2132)     Initial Impression / Assessment and Plan / ED Course  I have reviewed the triage vital signs and the nursing notes.  Pertinent labs & imaging results that were available during my care of the patient were reviewed by me and considered in my medical decision making (see chart for details).    Patient arrived in respiratory distress lots of wheezing.  Switched over to BiPAP from the CPAP from EMS.  Patient received another albuterol Atrovent nebulizer already had Solu-Medrol on board.  Patient On BiPAP she showed progressive improvement.  Chest x-ray showed no evidence of pneumonia.  Patient symptoms seem to be more just consistent with exacerbation of COPD.  Patient was eventually weaned from the BiPAP.  Put back on her 3 L nasal cannula did very well was comfortable verbalizing fine.  Hospitalist contacted for admission for overnight observation.  Labs significant for a leukocytosis.  Patient without any flu symptoms.  No evidence of pneumonia on chest x-ray.   Final Clinical Impressions(s) / ED Diagnoses   Final  diagnoses:  COPD exacerbation ([place])  Acute on chronic respiratory failure with hypoxia [facility])    ED Discharge Orders    None       Fredia Sorrow, MD 06/04/17 [phone]

## 2017-06-04 NOTE — H&P (Signed)
TRH H&P    Patient Demographics:    Leslie Porter, is a 72 y.o. female  MRN: [id number]  DOB - 05/14/1945  Admit Date - 06/03/2017  Referring MD/NP/PA: Dr. Deretha EmoryZackowski  Outpatient Primary MD for the patient is Eartha InchBadger, Michael C, MD  Patient coming from: home  Chief complaint-shortness of breath   HPI:    Leslie Bushyatricia Vought  is a 72 y.o. female, with history of COPD on home oxygen, chronic diastolic CHF came to hospital with worsening shortness of breath for past one week. Patient denies coughing up any phlegm. Denies chest pain. Patient is usually on 3 L of oxygen via nasal cannula. In the ED initially she was placed in BiPAP which has been currently weaned off. Patient is on oxygen via nasal cannula. She denies nausea vomiting or diarrhea. No dysuria urgency or frequency of urination. No chest pain, abdominal pain.     Review of systems:      All other systems reviewed and are negative.   With Past History of the following :    Past Medical History:  Diagnosis Date  . Asthma   . COPD (chronic obstructive pulmonary disease) ([facility])   . Diastolic dysfunction   . Osteoporosis       Past Surgical History:  Procedure Laterality Date  . VAGINAL HYSTERECTOMY        Social History:      Social History   Tobacco Use  . Smoking status: Former Smoker    Years: 55.00    Types: Cigarettes    Last attempt to quit: 12/25/2015    Years since quitting: 1.4  . Smokeless tobacco: Never Used  Substance Use Topics  . Alcohol use: Yes    Alcohol/week: 0.0 oz    Comment: Occasional       Family History :     Family History  Problem Relation Age of Onset  . Heart attack Father   . Heart disease Mother       Home Medications:   Prior to Admission medications   Medication Sig Start Date End Date Taking? Authorizing Provider  albuterol (PROVENTIL HFA;VENTOLIN HFA) 108 (90 Base) MCG/ACT  inhaler Inhale 1-2 puffs into the lungs every 6 (six) hours as needed for wheezing or shortness of breath.   Yes [provider]  alendronate (FOSAMAX) 70 MG tablet Take 70 mg by mouth every Sunday.  12/29/15  Yes [provider]  budesonide-formoterol (SYMBICORT) 160-4.5 MCG/ACT inhaler Inhale 2 puffs into the lungs 2 (two) times daily.   Yes [provider]  Calcium Carbonate-Vit D-Min (CALCIUM 1200 PO) Take 1 tablet by mouth daily.   Yes [provider]  guaiFENesin (MUCINEX) 600 MG 12 hr tablet Take 1 tablet (600 mg total) by mouth 2 (two) times daily as needed for cough or to loosen phlegm. 01/15/16  Yes Mikhail, Maryann, DO  ipratropium-albuterol (DUONEB) 0.5-2.5 (3) MG/3ML SOLN Take 3 mLs by nebulization 2 (two) times daily.   Yes [provider]  montelukast (SINGULAIR) 10 MG tablet Take 10 mg by mouth  at bedtime.   Yes [provider]  Multiple Vitamin (MULTIVITAMIN) capsule Take 1 capsule by mouth daily.   Yes [provider]  predniSONE (DELTASONE) 20 MG tablet Take 3 tablets by mouth daily X 2 days; then 2 tablets by mouth daily X 2 days; then 1 tablet by mouth daily X 3 days; then 1/2 tablet by mouth daily X 3 days and stop prednisone. 03/23/17  Yes Vassie Loll, MD  Respiratory Therapy Supplies (FLUTTER) DEVI Use device as directed 10/04/16  Yes Michele Mcalpine, MD  Tiotropium Bromide Monohydrate 1.25 MCG/ACT AERS Inhale 2 sprays into the lungs daily. 04/05/17  Yes [provider]  umeclidinium bromide (INCRUSE ELLIPTA) 62.5 MCG/INH AEPB Inhale 1 puff into the lungs daily. 11/01/16  Yes Michele Mcalpine, MD  cefdinir (OMNICEF) 300 MG capsule Take 1 capsule (300 mg total) by mouth 2 (two) times daily. Patient not taking: Reported on 06/03/2017 03/23/17   Vassie Loll, MD     Allergies:     Allergies  Allergen Reactions  . Levofloxacin Shortness Of Breath     Physical Exam:   Vitals  Blood pressure (!) 145/90,  pulse (!) 107, resp. rate (!) 33, height 5\' 3"  (1.6 m), weight 52.2 kg (115 lb), SpO2 96 %.  1.  General: appears in no acute distress  2. Psychiatric:  Intact judgement and  insight, awake alert, oriented x 3.  3. Neurologic: No focal neurological deficits, all cranial nerves intact.Strength 5/5 all 4 extremities, sensation intact all 4 extremities, plantars down going.  4. Eyes :  anicteric sclerae, moist conjunctivae with no lid lag. PERRLA.  5. ENMT:  Oropharynx clear with moist mucous membranes and good dentition  6. Neck:  supple, no cervical lymphadenopathy appriciated, No thyromegaly  7. Respiratory : Normal respiratory effort, decreased breath sounds bilaterally  On auscultation  8. Cardiovascular : RRR, no gallops, rubs or murmurs, no leg edema  9. Gastrointestinal:  Positive bowel sounds, abdomen soft, non-tender to palpation,no hepatosplenomegaly, no rigidity or guarding       10. Skin:  No cyanosis, normal texture and turgor, no rash, lesions or ulcers  11.Musculoskeletal:  Good muscle tone,  joints appear normal , no effusions,  normal range of motion    Data Review:    CBC Recent Labs  Lab 06/03/17 2102  WBC 12.5*  HGB 13.3  HCT 42.2  PLT 372  MCV 101.7*  MCH 32.0  MCHC 31.5  RDW 12.7  LYMPHSABS 0.7  MONOABS 0.4  EOSABS 0.1  BASOSABS 0.0   ------------------------------------------------------------------------------------------------------------------  Chemistries  Recent Labs  Lab 06/03/17 2102  NA 140  K 4.2  CL 103  CO2 27  GLUCOSE 114*  BUN 19  CREATININE 0.54  CALCIUM 8.8*  AST 19  ALT 19  ALKPHOS 54  BILITOT 0.4   ------------------------------------------------------------------------------------------------------------------  ------------------------------------------------------------------------------------------------------------------ GFR: Estimated Creatinine Clearance: 51.6 mL/min (by C-G formula based on SCr  of 0.54 mg/dL). Liver Function Tests: Recent Labs  Lab 06/03/17 2102  AST 19  ALT 19  ALKPHOS 54  BILITOT 0.4  PROT 7.2  ALBUMIN 4.1    --------------------------------------------------------------------------------------------------------------- Urine analysis:    Component Value Date/Time   COLORURINE YELLOW 04/06/2016 1142   APPEARANCEUR CLEAR 04/06/2016 1142   LABSPEC <1.005 (L) 04/06/2016 1142   PHURINE 6.0 04/06/2016 1142   GLUCOSEU NEGATIVE 04/06/2016 1142   HGBUR NEGATIVE 04/06/2016 1142   BILIRUBINUR NEGATIVE 04/06/2016 1142   KETONESUR NEGATIVE 04/06/2016 1142   PROTEINUR NEGATIVE 04/06/2016 1142  NITRITE NEGATIVE 04/06/2016 1142   LEUKOCYTESUR TRACE (A) 04/06/2016 1142      Imaging Results:    Dg Chest Port 1 View  Result Date: 06/03/2017 CLINICAL DATA:  Shortness of breath, history COPD, asthma EXAM: PORTABLE CHEST 1 VIEW COMPARISON:  Portable exam 2026 hours compared to 03/22/2017 FINDINGS: Normal heart size, mediastinal contours, and pulmonary vascularity. Atherosclerotic calcification aorta. Emphysematous changes without infiltrate, pleural effusion or pneumothorax. Questionable nodular density versus summation artifact lower RIGHT chest. Bones unremarkable. IMPRESSION: Emphysematous changes without acute infiltrate. Questionable nodular density lower RIGHT chest versus artifact; follow-up upright PA and lateral chest radiographs recommended. Electronically Signed   By: Ulyses Southward M.D.   On: 06/03/2017 20:52    My personal review of EKG: Rhythm NSR   Assessment & Plan:    Active Problems:   DNR (do not resuscitate) discussion   COPD with exacerbation (HCC)   1. COPD exacerbation-start Atrovent. and albuterol nebulizer Q6 hours,  start Solu-Medrol 40 mg IV Q6 hours. Mucinex one tablet PO BID    DVT Prophylaxis-   Lovenox   AM Labs Ordered, also please review Full Orders  Family Communication: Admission, patients condition and plan of care  including tests being ordered have been discussed with the patient  who indicate understanding and agree with the plan and Code Status.  Code Status: DNR  Admission status: observation  Time spent in minutes : 60 minutes   Meredeth Ide M.D on 06/04/2017 at 1:47 AM  Between 7am to 7pm - Pager - [phone]. After 7pm go to [email or web address] - password [facility]  [facility] - Office  [phone]

## 2017-06-04 NOTE — Progress Notes (Signed)
Patient briefly seen and examined, no family members at bedside.  She was admitted earlier today for shortness of breath, with history of COPD was admitted for acute COPD exacerbation.  She already feels improved although still not back to baseline.  Continue empiric antibiotics, nebs, steroids.  Peggye PittEstela Hernandez, MD [facility] Pager: [phone]

## 2017-06-05 ENCOUNTER — Institutional Professional Consult (permissible substitution): Payer: Self-pay | Admitting: Neurology

## 2017-06-05 DIAGNOSIS — J9621 Acute and chronic respiratory failure with hypoxia: Secondary | ICD-10-CM | POA: Diagnosis not present

## 2017-06-05 DIAGNOSIS — J441 Chronic obstructive pulmonary disease with (acute) exacerbation: Secondary | ICD-10-CM | POA: Diagnosis not present

## 2017-06-05 MED ORDER — PREDNISONE 10 MG PO TABS: 10.0000 mg | ORAL_TABLET | Freq: Every day | ORAL | 0 refills | Status: DC

## 2017-06-05 NOTE — Discharge Summary (Signed)
Physician Discharge Summary  Leslie Porter WUJ:[id number] DOB: 1946/04/10 DOA: 06/03/2017  PCP: [name], MD  Admit date: 06/03/2017 Discharge date: 06/05/2017  Time spent: 45 minutes  Recommendations for Outpatient Follow-up:  -Will be discharged home today. -Advised to follow up with PCP in 2 weeks.   Discharge Diagnoses:  Active Problems:   DNR (do not resuscitate) discussion   COPD with exacerbation [facility])   Discharge Condition: Stable and improved  Filed Weights   06/03/17 1945 06/03/17 1956 06/04/17 0246  Weight: 52.2 kg (115 lb) 52.2 kg (115 lb) 46.7 kg (102 lb 15.3 oz)    History of present illness:  As per Dr. Sharl Ma on 2/10: Leslie Porter  is a 72 y.o. female, with history of COPD on home oxygen, chronic diastolic CHF came to hospital with worsening shortness of breath for past one week. Patient denies coughing up any phlegm. Denies chest pain. Patient is usually on 3 L of oxygen via nasal cannula. In the ED initially she was placed in BiPAP which has been currently weaned off. Patient is on oxygen via nasal cannula. She denies nausea vomiting or diarrhea. No dysuria urgency or frequency of urination. No chest pain, abdominal pain.     Hospital Course:   Acute on Chronic Hypoxemic Respiratory Failure -Due to COPD with Acute Exacerbation. -States she is back to baseline. -Clinically improved, no wheezing on lung auscultation. -Will DC on a prednisone taper. -She has valid prescriptions for all her inhalers.  Procedures:  None   Consultations:  None  Discharge Instructions  Discharge Instructions    Diet - low sodium heart healthy   Complete by:  As directed    Increase activity slowly   Complete by:  As directed      Allergies as of 06/05/2017      Reactions   Levofloxacin Shortness Of Breath      Medication List    STOP taking these medications   cefdinir 300 MG capsule Commonly known as:  OMNICEF     TAKE these  medications   albuterol 108 (90 Base) MCG/ACT inhaler Commonly known as:  PROVENTIL HFA;VENTOLIN HFA Inhale 1-2 puffs into the lungs every 6 (six) hours as needed for wheezing or shortness of breath.   alendronate 70 MG tablet Commonly known as:  FOSAMAX Take 70 mg by mouth every Sunday.   budesonide-formoterol 160-4.5 MCG/ACT inhaler Commonly known as:  SYMBICORT Inhale 2 puffs into the lungs 2 (two) times daily.   CALCIUM 1200 PO Take 1 tablet by mouth daily.   FLUTTER Devi Use device as directed   guaiFENesin 600 MG 12 hr tablet Commonly known as:  MUCINEX Take 1 tablet (600 mg total) by mouth 2 (two) times daily as needed for cough or to loosen phlegm.   ipratropium-albuterol 0.5-2.5 (3) MG/3ML Soln Commonly known as:  DUONEB Take 3 mLs by nebulization 2 (two) times daily.   montelukast 10 MG tablet Commonly known as:  SINGULAIR Take 10 mg by mouth at bedtime.   multivitamin capsule Take 1 capsule by mouth daily.   predniSONE 10 MG tablet Commonly known as:  DELTASONE Take 1 tablet (10 mg total) by mouth daily with breakfast. Take 6 tablets today and then decrease by 1 tablet daily until none are left. What changed:    medication strength  how much to take  how to take this  when to take this  additional instructions   Tiotropium Bromide Monohydrate 1.25 MCG/ACT Aers Inhale 2 sprays  into the lungs daily.   umeclidinium bromide 62.5 MCG/INH Aepb Commonly known as:  INCRUSE ELLIPTA Inhale 1 puff into the lungs daily.      Allergies  Allergen Reactions  . Levofloxacin Shortness Of Breath   Follow-up Information    Eartha InchBadger, Michael C, MD. Schedule an appointment as soon as possible for a visit on 06/14/2017.   Specialty:  Family Medicine Why:  3:30 Contact information: [address] [place] [address] [phone]            The results of significant diagnostics from this hospitalization (including imaging, microbiology, ancillary  and laboratory) are listed below for reference.    Significant Diagnostic Studies: Dg Chest Port 1 View  Result Date: 06/03/2017 CLINICAL DATA:  Shortness of breath, history COPD, asthma EXAM: PORTABLE CHEST 1 VIEW COMPARISON:  Portable exam 2026 hours compared to 03/22/2017 FINDINGS: Normal heart size, mediastinal contours, and pulmonary vascularity. Atherosclerotic calcification aorta. Emphysematous changes without infiltrate, pleural effusion or pneumothorax. Questionable nodular density versus summation artifact lower RIGHT chest. Bones unremarkable. IMPRESSION: Emphysematous changes without acute infiltrate. Questionable nodular density lower RIGHT chest versus artifact; follow-up upright PA and lateral chest radiographs recommended. Electronically Signed   By: Ulyses SouthwardMark  Boles M.D.   On: 06/03/2017 20:52    Microbiology: No results found for this or any previous visit (from the past 240 hour(s)).   Labs: Basic Metabolic Panel: Recent Labs  Lab 06/03/17 2102 06/04/17 0612  NA 140 137  K 4.2 3.9  CL 103 103  CO2 27 26  GLUCOSE 114* 184*  BUN 19 12  CREATININE 0.54 0.47  CALCIUM 8.8* 8.4*   Liver Function Tests: Recent Labs  Lab 06/03/17 2102 06/04/17 0612  AST 19 18  ALT 19 17  ALKPHOS 54 48  BILITOT 0.4 0.4  PROT 7.2 6.6  ALBUMIN 4.1 3.7   No results for input(s): LIPASE, AMYLASE in the last 168 hours. No results for input(s): AMMONIA in the last 168 hours. CBC: Recent Labs  Lab 06/03/17 2102 06/04/17 0612  WBC 12.5* 9.5  NEUTROABS 11.4*  --   HGB 13.3 12.6  HCT 42.2 40.0  MCV 101.7* 100.8*  PLT 372 406*   Cardiac Enzymes: No results for input(s): CKTOTAL, CKMB, CKMBINDEX, TROPONINI in the last 168 hours. BNP: BNP (last 3 results) Recent Labs    03/16/17 0211  BNP 159.0*    ProBNP (last 3 results) No results for input(s): PROBNP in the last 8760 hours.  CBG: No results for input(s): GLUCAP in the last 168 hours.     Signed:  Chaya Janstela Hernandez  [place]  [facility] Pager: [phone] 06/05/2017, 1:39 PM

## 2017-06-05 NOTE — Care Management Obs Status (Signed)
MEDICARE OBSERVATION STATUS NOTIFICATION   Patient Details  Name: Leslie Bushyatricia Delis MRN: [id number] Date of Birth: 11/25/1945   Medicare Observation Status Notification Given:  Yes    Malcolm MetroChildress, Saachi Zale Demske, RN 06/05/2017, 10:14 AM

## 2017-06-05 NOTE — Progress Notes (Signed)
IV removed, patient tolerated well, 2x2 gauze and paper tape applied to site. Reviewed AVS with patient, who verbalized understanding, hard copy prescription given to patient for prednisone.  Patient to be discharged home via niece. Patient taken to car via wheelchair.

## 2017-06-23 ENCOUNTER — Emergency Department (HOSPITAL_COMMUNITY): Payer: Medicare Other

## 2017-06-23 ENCOUNTER — Encounter (HOSPITAL_COMMUNITY): Payer: Self-pay | Admitting: *Deleted

## 2017-06-23 ENCOUNTER — Inpatient Hospital Stay (HOSPITAL_COMMUNITY)
Admission: EM | Admit: 2017-06-23 | Discharge: 2017-06-25 | DRG: 871 | Disposition: A | Discharge status: Home / Self Care | Payer: Medicare Other | Attending: Internal Medicine | Admitting: Internal Medicine

## 2017-06-23 ENCOUNTER — Other Ambulatory Visit: Payer: Self-pay

## 2017-06-23 DIAGNOSIS — J441 Chronic obstructive pulmonary disease with (acute) exacerbation: Secondary | ICD-10-CM

## 2017-06-23 DIAGNOSIS — A419 Sepsis, unspecified organism: Secondary | ICD-10-CM | POA: Diagnosis not present

## 2017-06-23 DIAGNOSIS — Z881 Allergy status to other antibiotic agents status: Secondary | ICD-10-CM

## 2017-06-23 DIAGNOSIS — R509 Fever, unspecified: Secondary | ICD-10-CM | POA: Diagnosis present

## 2017-06-23 DIAGNOSIS — J9621 Acute and chronic respiratory failure with hypoxia: Secondary | ICD-10-CM

## 2017-06-23 DIAGNOSIS — M810 Age-related osteoporosis without current pathological fracture: Secondary | ICD-10-CM | POA: Diagnosis present

## 2017-06-23 DIAGNOSIS — Z90710 Acquired absence of both cervix and uterus: Secondary | ICD-10-CM

## 2017-06-23 DIAGNOSIS — J9600 Acute respiratory failure, unspecified whether with hypoxia or hypercapnia: Secondary | ICD-10-CM

## 2017-06-23 DIAGNOSIS — Z9981 Dependence on supplemental oxygen: Secondary | ICD-10-CM

## 2017-06-23 DIAGNOSIS — Z7983 Long term (current) use of bisphosphonates: Secondary | ICD-10-CM | POA: Diagnosis not present

## 2017-06-23 DIAGNOSIS — Z87891 Personal history of nicotine dependence: Secondary | ICD-10-CM | POA: Diagnosis not present

## 2017-06-23 DIAGNOSIS — Z8249 Family history of ischemic heart disease and other diseases of the circulatory system: Secondary | ICD-10-CM

## 2017-06-23 LAB — PROCALCITONIN: Procalcitonin: 0.92 ng/mL

## 2017-06-23 LAB — PROTIME-INR
INR: 1.03
PROTHROMBIN TIME: 13.4 s (ref 11.4–15.2)

## 2017-06-23 LAB — I-STAT CG4 LACTIC ACID, ED
LACTIC ACID, VENOUS: 0.47 mmol/L — AB (ref 0.5–1.9)
Lactic Acid, Venous: 0.87 mmol/L (ref 0.5–1.9)

## 2017-06-23 LAB — URINALYSIS, ROUTINE W REFLEX MICROSCOPIC
BILIRUBIN URINE: NEGATIVE
Glucose, UA: NEGATIVE mg/dL
Hgb urine dipstick: NEGATIVE
Ketones, ur: NEGATIVE mg/dL
Leukocytes, UA: NEGATIVE
Nitrite: NEGATIVE
PROTEIN: NEGATIVE mg/dL
SPECIFIC GRAVITY, URINE: 1.011 (ref 1.005–1.030)
pH: 7 (ref 5.0–8.0)

## 2017-06-23 LAB — CBC WITH DIFFERENTIAL/PLATELET
BASOS PCT: 1 %
Basophils Absolute: 0.1 10*3/uL (ref 0.0–0.1)
EOS ABS: 0.6 10*3/uL (ref 0.0–0.7)
Eosinophils Relative: 6 %
HEMATOCRIT: 38.7 % (ref 36.0–46.0)
Hemoglobin: 12.4 g/dL (ref 12.0–15.0)
Lymphocytes Relative: 6 %
Lymphs Abs: 0.6 10*3/uL — ABNORMAL LOW (ref 0.7–4.0)
MCH: 31.3 pg (ref 26.0–34.0)
MCHC: 32 g/dL (ref 30.0–36.0)
MCV: 97.7 fL (ref 78.0–100.0)
MONO ABS: 0.7 10*3/uL (ref 0.1–1.0)
MONOS PCT: 6 %
NEUTROS ABS: 9 10*3/uL — AB (ref 1.7–7.7)
Neutrophils Relative %: 81 %
Platelets: 365 10*3/uL (ref 150–400)
RBC: 3.96 MIL/uL (ref 3.87–5.11)
RDW: 12.4 % (ref 11.5–15.5)
WBC: 11 10*3/uL — ABNORMAL HIGH (ref 4.0–10.5)

## 2017-06-23 LAB — BLOOD GAS, ARTERIAL
ACID-BASE DEFICIT: 1.5 mmol/L (ref 0.0–2.0)
Bicarbonate: 25.3 mmol/L (ref 20.0–28.0)
DELIVERY SYSTEMS: POSITIVE
DRAWN BY: 22223
Expiratory PAP: 5
FIO2: 50
INSPIRATORY PAP: 12
O2 Saturation: 99.1 %
RATE: 8 resp/min
pCO2 arterial: 38.9 mmHg (ref 32.0–48.0)
pH, Arterial: 7.43 (ref 7.350–7.450)
pO2, Arterial: 217 mmHg — ABNORMAL HIGH (ref 83.0–108.0)

## 2017-06-23 LAB — COMPREHENSIVE METABOLIC PANEL
ALBUMIN: 3.3 g/dL — AB (ref 3.5–5.0)
ALK PHOS: 58 U/L (ref 38–126)
ALT: 18 U/L (ref 14–54)
AST: 16 U/L (ref 15–41)
Anion gap: 11 (ref 5–15)
BUN: 13 mg/dL (ref 6–20)
CALCIUM: 8.7 mg/dL — AB (ref 8.9–10.3)
CO2: 26 mmol/L (ref 22–32)
CREATININE: 0.52 mg/dL (ref 0.44–1.00)
Chloride: 95 mmol/L — ABNORMAL LOW (ref 101–111)
GFR calc Af Amer: >60 mL/min (ref >60)
GFR calc non Af Amer: >60 mL/min (ref >60)
GLUCOSE: 114 mg/dL — AB (ref 65–99)
Potassium: 3.7 mmol/L (ref 3.5–5.1)
SODIUM: 132 mmol/L — AB (ref 135–145)
Total Bilirubin: 0.3 mg/dL (ref 0.3–1.2)
Total Protein: 6.9 g/dL (ref 6.5–8.1)

## 2017-06-23 LAB — APTT: aPTT: 37 seconds — ABNORMAL HIGH (ref 24–36)

## 2017-06-23 LAB — INFLUENZA PANEL BY PCR (TYPE A & B)
INFLAPCR: NEGATIVE
INFLBPCR: NEGATIVE

## 2017-06-23 LAB — MRSA PCR SCREENING: MRSA BY PCR: NEGATIVE

## 2017-06-23 LAB — LACTIC ACID, PLASMA
Lactic Acid, Venous: 1.1 mmol/L (ref 0.5–1.9)
Lactic Acid, Venous: 0.9 mmol/L (ref 0.5–1.9)

## 2017-06-23 LAB — CK: Total CK: 36 U/L — ABNORMAL LOW (ref 38–234)

## 2017-06-23 MED ORDER — IPRATROPIUM-ALBUTEROL 0.5-2.5 (3) MG/3ML IN SOLN
3.0000 mL | Freq: Four times a day (QID) | RESPIRATORY_TRACT | Status: DC
  Administered 2017-06-23 – 2017-06-24 (×4): 3 mL via RESPIRATORY_TRACT
  Filled 2017-06-23 (×4): qty 3

## 2017-06-23 MED ORDER — OSELTAMIVIR PHOSPHATE 75 MG PO CAPS
75.0000 mg | ORAL_CAPSULE | Freq: Once | ORAL | Status: AC
  Administered 2017-06-23: 75 mg via ORAL
  Filled 2017-06-23: qty 1

## 2017-06-23 MED ORDER — ADULT MULTIVITAMIN W/MINERALS CH
1.0000 | ORAL_TABLET | Freq: Every day | ORAL | Status: DC
  Administered 2017-06-23 – 2017-06-25 (×3): 1 via ORAL
  Filled 2017-06-23 (×3): qty 1

## 2017-06-23 MED ORDER — IBUPROFEN 400 MG PO TABS
400.0000 mg | ORAL_TABLET | Freq: Once | ORAL | Status: AC
  Administered 2017-06-23: 400 mg via ORAL
  Filled 2017-06-23: qty 1

## 2017-06-23 MED ORDER — VANCOMYCIN HCL IN DEXTROSE 1-5 GM/200ML-% IV SOLN: 1000.0000 mg | Freq: Once | INTRAVENOUS | Status: DC

## 2017-06-23 MED ORDER — BUDESONIDE 0.5 MG/2ML IN SUSP
0.5000 mg | Freq: Two times a day (BID) | RESPIRATORY_TRACT | Status: DC
  Administered 2017-06-23 – 2017-06-25 (×4): 0.5 mg via RESPIRATORY_TRACT
  Filled 2017-06-23 (×10): qty 2

## 2017-06-23 MED ORDER — ALBUTEROL SULFATE (2.5 MG/3ML) 0.083% IN NEBU
5.0000 mg | INHALATION_SOLUTION | Freq: Once | RESPIRATORY_TRACT | Status: AC
  Administered 2017-06-23: 5 mg via RESPIRATORY_TRACT
  Filled 2017-06-23: qty 6

## 2017-06-23 MED ORDER — POTASSIUM CHLORIDE IN NACL 20-0.9 MEQ/L-% IV SOLN
INTRAVENOUS | Status: AC
  Administered 2017-06-23: 14:00:00 via INTRAVENOUS

## 2017-06-23 MED ORDER — SODIUM CHLORIDE 0.9 % IV BOLUS (SEPSIS)
1000.0000 mL | Freq: Once | INTRAVENOUS | Status: AC
  Administered 2017-06-23: 1000 mL via INTRAVENOUS

## 2017-06-23 MED ORDER — GUAIFENESIN ER 600 MG PO TB12: 600.0000 mg | ORAL_TABLET | Freq: Two times a day (BID) | ORAL | Status: DC | PRN

## 2017-06-23 MED ORDER — VANCOMYCIN HCL IN DEXTROSE 1-5 GM/200ML-% IV SOLN
1000.0000 mg | Freq: Once | INTRAVENOUS | Status: AC
  Administered 2017-06-23: 1000 mg via INTRAVENOUS
  Filled 2017-06-23: qty 200

## 2017-06-23 MED ORDER — SODIUM CHLORIDE 0.9 % IV BOLUS (SEPSIS)
250.0000 mL | Freq: Once | INTRAVENOUS | Status: AC
  Administered 2017-06-23: 250 mL via INTRAVENOUS

## 2017-06-23 MED ORDER — ENOXAPARIN SODIUM 40 MG/0.4ML ~~LOC~~ SOLN
40.0000 mg | SUBCUTANEOUS | Status: DC
  Filled 2017-06-23 (×2): qty 0.4

## 2017-06-23 MED ORDER — SODIUM CHLORIDE 0.9 % IV BOLUS (SEPSIS)
500.0000 mL | Freq: Once | INTRAVENOUS | Status: AC
  Administered 2017-06-23: 500 mL via INTRAVENOUS

## 2017-06-23 MED ORDER — ACETAMINOPHEN 650 MG RE SUPP
650.0000 mg | Freq: Once | RECTAL | Status: AC
Start: 2017-06-23 — End: 2017-06-23
  Administered 2017-06-23: 650 mg via RECTAL

## 2017-06-23 MED ORDER — ACETAMINOPHEN 650 MG RE SUPP
RECTAL | Status: AC
  Filled 2017-06-23: qty 1

## 2017-06-23 MED ORDER — PIPERACILLIN-TAZOBACTAM 3.375 G IVPB
3.3750 g | Freq: Three times a day (TID) | INTRAVENOUS | Status: DC
  Administered 2017-06-23 – 2017-06-25 (×6): 3.375 g via INTRAVENOUS
  Filled 2017-06-23 (×5): qty 50

## 2017-06-23 MED ORDER — ONDANSETRON HCL 4 MG PO TABS: 4.0000 mg | ORAL_TABLET | Freq: Four times a day (QID) | ORAL | Status: DC | PRN

## 2017-06-23 MED ORDER — METHYLPREDNISOLONE SODIUM SUCC 40 MG IJ SOLR
40.0000 mg | Freq: Two times a day (BID) | INTRAMUSCULAR | Status: AC
  Administered 2017-06-23 – 2017-06-24 (×3): 40 mg via INTRAVENOUS
  Filled 2017-06-23 (×3): qty 1

## 2017-06-23 MED ORDER — ACETAMINOPHEN 650 MG RE SUPP: 650.0000 mg | Freq: Four times a day (QID) | RECTAL | Status: DC | PRN

## 2017-06-23 MED ORDER — VANCOMYCIN HCL IN DEXTROSE 750-5 MG/150ML-% IV SOLN
750.0000 mg | INTRAVENOUS | Status: DC
  Administered 2017-06-24 – 2017-06-25 (×2): 750 mg via INTRAVENOUS
  Filled 2017-06-23 (×3): qty 150

## 2017-06-23 MED ORDER — ONDANSETRON HCL 4 MG/2ML IJ SOLN: 4.0000 mg | Freq: Four times a day (QID) | INTRAMUSCULAR | Status: DC | PRN

## 2017-06-23 MED ORDER — PIPERACILLIN-TAZOBACTAM 3.375 G IVPB 30 MIN
3.3750 g | Freq: Once | INTRAVENOUS | Status: AC
  Administered 2017-06-23: 3.375 g via INTRAVENOUS
  Filled 2017-06-23: qty 50

## 2017-06-23 MED ORDER — MONTELUKAST SODIUM 10 MG PO TABS
10.0000 mg | ORAL_TABLET | Freq: Every day | ORAL | Status: DC
  Administered 2017-06-23 – 2017-06-24 (×2): 10 mg via ORAL
  Filled 2017-06-23 (×2): qty 1

## 2017-06-23 MED ORDER — ACETAMINOPHEN 325 MG PO TABS
650.0000 mg | ORAL_TABLET | Freq: Four times a day (QID) | ORAL | Status: DC | PRN
  Administered 2017-06-23 – 2017-06-24 (×2): 650 mg via ORAL
  Filled 2017-06-23 (×2): qty 2

## 2017-06-23 NOTE — H&P (Signed)
History and Physical  Leslie Porter ZOX:[id number] DOB: 12-09-1945 DOA: 06/23/2017   PCP: [name], MD   Patient coming from: Home  Chief Complaint: fever, chills  HPI:  Leslie Porter is a 72 y.o. female with medical history of COPD, chronic respiratory failure on 2.5 L at home, diastolic dysfunction, osteoporosis presenting with fevers, chills, and rigors that started when she woke up at 2 AM on the morning of 06/23/2017.  Patient states that she had been in her usual state of health without any recent complaints up until the morning of 06/23/2017.  She stated that there is not been any changes in her medicines, and she has not had any headaches, neck pain chest pain, shortness breath, coughing, vomiting, diarrhea, abdominal pain, dysuria, hematuria.  The patient does complain of some nausea.  She denies any recent sick contacts.  She states that she quit smoking 1 year ago after 60-pack-year history.  EMS was activated.  Apparently, the patient had some respiratory distress upon their arrival and was placed on CPAP.  She was placed on BiPAP in the emergency department initially, but was weaned off upon arrival.   In the emergency department, the patient had a rectal temperature of 105.0 F.  She was tachycardic into the 120s.  She remained hemodynamically stable.  She was initially placed on BiPAP with 100% saturation.  She was taken off BiPAP without any respiratory distress.  The patient received the appropriate fluid bolus.  She was started on vancomycin and Zosyn after blood cultures were obtained.  She initially received a dose of the last time of year.  Her influenza PCR subsequently came back negative.    Assessment/Plan: Sepsis -Suspect bacteremia versus pulmonary source -Lactic acid 0.87 -Check procalcitonin -Continue vancomycin and Zosyn pending culture data -IV fluids -Influenza PCR negative -Urinalysis negative for pyuria  Acute on chronic respiratory failure  with hypoxia -Suspect mild COPD exacerbation -Chronically on 2.5 L at home -Wean oxygen back to home requirement -Start Pulmicort -Start duo nebs -Start Solu-Medrol  COPD exacerbation -Patient is wheezing on examination -Steroids and bronchodilators as above -Respiratory viral panel         Past Medical History:  Diagnosis Date  . Asthma   . COPD (chronic obstructive pulmonary disease) (HCC)   . Diastolic dysfunction   . Osteoporosis    Past Surgical History:  Procedure Laterality Date  . VAGINAL HYSTERECTOMY     Social History:  reports that she quit smoking about 17 months ago. Her smoking use included cigarettes. She quit after 55.00 years of use. she has never used smokeless tobacco. She reports that she drinks alcohol. She reports that she does not use drugs.   Family History  Problem Relation Age of Onset  . Heart attack Father   . Heart disease Mother      Allergies  Allergen Reactions  . Levofloxacin Shortness Of Breath     Prior to Admission medications   Medication Sig Start Date End Date Taking? Authorizing Provider  albuterol (PROVENTIL HFA;VENTOLIN HFA) 108 (90 Base) MCG/ACT inhaler Inhale 1-2 puffs into the lungs every 6 (six) hours as needed for wheezing or shortness of breath.    [provider]  alendronate (FOSAMAX) 70 MG tablet Take 70 mg by mouth every Sunday.  12/29/15   [provider]  budesonide-formoterol (SYMBICORT) 160-4.5 MCG/ACT inhaler Inhale 2 puffs into the lungs 2 (two) times daily.    [provider]  Calcium Carbonate-Vit D-Min (CALCIUM  1200 PO) Take 1 tablet by mouth daily.    [provider]  guaiFENesin (MUCINEX) 600 MG 12 hr tablet Take 1 tablet (600 mg total) by mouth 2 (two) times daily as needed for cough or to loosen phlegm. 01/15/16   Mikhail, Nita Sells, DO  ipratropium-albuterol (DUONEB) 0.5-2.5 (3) MG/3ML SOLN Take 3 mLs by nebulization 2 (two) times daily.    [provider]    montelukast (SINGULAIR) 10 MG tablet Take 10 mg by mouth at bedtime.    [provider]  Multiple Vitamin (MULTIVITAMIN) capsule Take 1 capsule by mouth daily.    [provider]  predniSONE (DELTASONE) 10 MG tablet Take 1 tablet (10 mg total) by mouth daily with breakfast. Take 6 tablets today and then decrease by 1 tablet daily until none are left. 06/05/17   Philip Aspen, Limmie Toniette, MD  Respiratory Therapy Supplies (FLUTTER) DEVI Use device as directed 10/04/16   Michele Mcalpine, MD  Tiotropium Bromide Monohydrate 1.25 MCG/ACT AERS Inhale 2 sprays into the lungs daily. 04/05/17   [provider]  umeclidinium bromide (INCRUSE ELLIPTA) 62.5 MCG/INH AEPB Inhale 1 puff into the lungs daily. 11/01/16   Michele Mcalpine, MD    Review of Systems:  Constitutional:  No weight loss, night sweats Head&Eyes: No headache.  No vision loss.  No eye pain or scotoma ENT:  No Difficulty swallowing,Tooth/dental problems,Sore throat,  No ear ache, post nasal drip,  Cardio-vascular:  No chest pain, Orthopnea, PND, swelling in lower extremities,  dizziness, palpitations  GI:  No  abdominal pain, nausea, vomiting, diarrhea, loss of appetite, hematochezia, melena, heartburn, indigestion, Resp:   No coughing up of blood .No wheezing.No chest wall deformity  Skin:  no rash or lesions.  GU:  no dysuria, change in color of urine, no urgency or frequency. No flank pain.  Musculoskeletal:  No joint pain or swelling. No decreased range of motion. No back pain.  Psych:  No change in mood or affect. No depression or anxiety. Neurologic: No headache, no dysesthesia, no focal weakness, no vision loss. No syncope  Physical Exam: Vitals:   06/23/17 0708 06/23/17 0729 06/23/17 0730 06/23/17 0752  BP:   (!) 127/53 (!) 127/53  Pulse:   (!) 118 (!) 120  Resp:   (!) 29 (!) 24  Temp:  (!) 105.1 F (40.6 C)  99.2 F (37.3 C)  TempSrc:  Rectal  Oral  SpO2: 100%  99% 98%  Weight:       Height:       General:  A&O x 3, NAD, nontoxic, pleasant/cooperative Head/Eye: No conjunctival hemorrhage, no icterus, [place]/AT, No nystagmus ENT:  No icterus,  No thrush, good dentition, no pharyngeal exudate Neck:  No masses, no lymphadenpathy, no bruits CV:  RRR, no rub, no gallop, no S3 Lung: Bibasilar rales.  Bibasilar expiratory wheeze.  Good air movement. Abdomen: soft/NT, +BS, nondistended, no peritoneal signs Ext: No cyanosis, No rashes, No petechiae, No lymphangitis, No edema Neuro: CNII-XII intact, strength 4/5 in bilateral upper and lower extremities, no dysmetria  Labs on Admission:  Basic Metabolic Panel: Recent Labs  Lab 06/23/17 0625  NA 132*  K 3.7  CL 95*  CO2 26  GLUCOSE 114*  BUN 13  CREATININE 0.52  CALCIUM 8.7*   Liver Function Tests: Recent Labs  Lab 06/23/17 0625  AST 16  ALT 18  ALKPHOS 58  BILITOT 0.3  PROT 6.9  ALBUMIN 3.3*   No results for input(s): LIPASE, AMYLASE  in the last 168 hours. No results for input(s): AMMONIA in the last 168 hours. CBC: Recent Labs  Lab 06/23/17 0625  WBC 11.0*  NEUTROABS 9.0*  HGB 12.4  HCT 38.7  MCV 97.7  PLT 365   Coagulation Profile: No results for input(s): INR, PROTIME in the last 168 hours. Cardiac Enzymes: Recent Labs  Lab 06/23/17 0649  CKTOTAL 36*   BNP: Invalid input(s): POCBNP CBG: No results for input(s): GLUCAP in the last 168 hours. Urine analysis:    Component Value Date/Time   COLORURINE STRAW (A) 06/23/2017 0726   APPEARANCEUR CLEAR 06/23/2017 0726   LABSPEC 1.011 06/23/2017 0726   PHURINE 7.0 06/23/2017 0726   GLUCOSEU NEGATIVE 06/23/2017 0726   HGBUR NEGATIVE 06/23/2017 0726   BILIRUBINUR NEGATIVE 06/23/2017 0726   KETONESUR NEGATIVE 06/23/2017 0726   PROTEINUR NEGATIVE 06/23/2017 0726   NITRITE NEGATIVE 06/23/2017 0726   LEUKOCYTESUR NEGATIVE 06/23/2017 0726   Sepsis Labs: @LABRCNTIP (procalcitonin:4,lacticidven:4) )No results found for this or any previous visit  (from the past 240 hour(s)).   Radiological Exams on Admission: Dg Chest Portable 1 View  Result Date: 06/23/2017 CLINICAL DATA:  Shortness of breath EXAM: PORTABLE CHEST 1 VIEW COMPARISON:  06/03/2017 FINDINGS: Chronic hyperinflation and interstitial coarsening. There is no edema, consolidation, effusion, or pneumothorax. Nodule questioned on prior study is not seen today. Normal heart size and mediastinal contours. IMPRESSION: COPD without acute superimposed finding. Electronically Signed   By: Marnee SpringJonathon  Watts M.D.   On: 06/23/2017 07:07    EKG: Independently reviewed. Sinus, no ST-T changes    Time spent:60 minutes Code Status:   FULL Family Communication:  No Family at bedside Disposition Plan: expect 2-3 day hospitalization Consults called: none DVT Prophylaxis: [place] Lovenox  Catarina HartshornDavid Tinzlee Craker, DO  [facility] Pager [phone]  If 7PM-7AM, please contact night-coverage [email or web address] Password [email or web address] 06/23/2017, 7:59 AM

## 2017-06-23 NOTE — ED Triage Notes (Signed)
Pt arrived by by EMS from home. Called out for weakness, Pt was SOB and was placed on CPAP by EMS. EMS states pt felt hot to the touch.

## 2017-06-23 NOTE — ED Notes (Signed)
Pt ambulatory to the bathroom on room air, did well, back in bed at 91% O2 sat, pt place back on [place] at 2L. Eating breakfast, states she feels better.

## 2017-06-23 NOTE — ED Notes (Signed)
Patient given some water at this time.

## 2017-06-23 NOTE — ED Notes (Signed)
RT notified of need for neb.

## 2017-06-23 NOTE — Progress Notes (Signed)
Pharmacy Antibiotic Note  Leslie Porter is a 72 y.o. female admitted on 06/23/2017 with sepsis.  Pharmacy has been consulted for vancomycin and zosyn dosing. Initial doses ordered in the ED  Plan: Zosyn 3.375g IV q8h (4 hour infusion).  Continue vancomycin 750 mg IV q24 hours F/u renal function, cultures and clinical course  Height: 5\' 3"  (160 cm) Weight: 115 lb (52.2 kg) IBW/kg (Calculated) : 52.4  Temp (24hrs), Avg:101.4 F (38.6 C), Min:99.2 F (37.3 C), Max:105.1 F (40.6 C)  Recent Labs  Lab 06/23/17 0625 06/23/17 0651  WBC 11.0*  --   CREATININE 0.52  --   LATICACIDVEN  --  0.87    Estimated Creatinine Clearance: 51.6 mL/min (by C-G formula based on SCr of 0.52 mg/dL).    Allergies  Allergen Reactions  . Levofloxacin Shortness Of Breath    Antimicrobials this admission: vanc 3/1  >>  zosyn 3/1 >>   Thank you for allowing pharmacy to be a part of this patient's care.  Talbert CageSeay, [place] 06/23/2017 8:08 AM

## 2017-06-23 NOTE — ED Provider Notes (Signed)
[facility] EMERGENCY DEPARTMENT Provider Note   CSN: [id number] Arrival date & time: 06/23/17  1478     History   Chief Complaint Chief Complaint  Patient presents with  . Shortness of Breath  Level 5 caveat due to acuity of condition.  HPI Leslie Porter is a 72 y.o. female.  The history is provided by the patient and the EMS personnel. The history is limited by the condition of the patient.  Shortness of Breath  This is a new problem. Duration: Prior to arrival. The problem occurs continuously.The problem has been rapidly worsening. Associated symptoms include a fever. Associated medical issues include COPD.  Presents from home via EMS.  Per EMS, they were called out for weakness, but on their arrival patient was in severe respiratory distress.  She was placed on CPAP and began to improve.    Patient reports she woke up feeling chills and weak.  She reports mild headache.  No new cough, no new vomiting.  The rest of the history is difficult to obtain due to acuity of condition, and she has a BiPAP mask in place Past Medical History:  Diagnosis Date  . Asthma   . COPD (chronic obstructive pulmonary disease) (HCC)   . Diastolic dysfunction   . Osteoporosis     Patient Active Problem List   Diagnosis Date Noted  . COPD with exacerbation (HCC) 06/04/2017  . Gastroesophageal reflux disease   . Hypokalemia 03/22/2017  . Acute on chronic respiratory failure (HCC) 03/22/2017  . Acute respiratory failure (HCC) 03/16/2017  . Elevated MCV 03/16/2017  . Diastolic dysfunction 03/16/2017  . Acute on chronic respiratory failure with hypoxia and hypercapnia (HCC) 03/16/2017  . Nicotine abuse 03/16/2017  . COPD exacerbation (HCC)   . Ex-cigarette smoker 10/04/2016  . Palliative care encounter   . Goals of care, counseling/discussion   . DNR (do not resuscitate) discussion   . Acute hypoxemic respiratory failure (HCC) 12/31/2015  . COPD with acute exacerbation (HCC) 12/31/2015  .  CAP (community acquired pneumonia) 12/31/2015  . Respiratory failure (HCC) 12/31/2015  . COPD mixed type (HCC) 06/24/2015  . Dyspnea 06/24/2015    Past Surgical History:  Procedure Laterality Date  . VAGINAL HYSTERECTOMY      OB History    No data available       Home Medications    Prior to Admission medications   Medication Sig Start Date End Date Taking? Authorizing Provider  albuterol (PROVENTIL HFA;VENTOLIN HFA) 108 (90 Base) MCG/ACT inhaler Inhale 1-2 puffs into the lungs every 6 (six) hours as needed for wheezing or shortness of breath.    [provider]  alendronate (FOSAMAX) 70 MG tablet Take 70 mg by mouth every Sunday.  12/29/15   [provider]  budesonide-formoterol (SYMBICORT) 160-4.5 MCG/ACT inhaler Inhale 2 puffs into the lungs 2 (two) times daily.    [provider]  Calcium Carbonate-Vit D-Min (CALCIUM 1200 PO) Take 1 tablet by mouth daily.    [provider]  guaiFENesin (MUCINEX) 600 MG 12 hr tablet Take 1 tablet (600 mg total) by mouth 2 (two) times daily as needed for cough or to loosen phlegm. 01/15/16   Mikhail, Nita Sells, DO  ipratropium-albuterol (DUONEB) 0.5-2.5 (3) MG/3ML SOLN Take 3 mLs by nebulization 2 (two) times daily.    [provider]  montelukast (SINGULAIR) 10 MG tablet Take 10 mg by mouth at bedtime.    [provider]  Multiple Vitamin (MULTIVITAMIN) capsule Take 1 capsule by mouth daily.  [provider]  predniSONE (DELTASONE) 10 MG tablet Take 1 tablet (10 mg total) by mouth daily with breakfast. Take 6 tablets today and then decrease by 1 tablet daily until none are left. 06/05/17   Philip Aspen, Limmie Daisi, MD  Respiratory Therapy Supplies (FLUTTER) DEVI Use device as directed 10/04/16   Michele Mcalpine, MD  Tiotropium Bromide Monohydrate 1.25 MCG/ACT AERS Inhale 2 sprays into the lungs daily. 04/05/17   [provider]  umeclidinium bromide (INCRUSE ELLIPTA) 62.5 MCG/INH  AEPB Inhale 1 puff into the lungs daily. 11/01/16   Michele Mcalpine, MD    Family History Family History  Problem Relation Age of Onset  . Heart attack Father   . Heart disease Mother     Social History Social History   Tobacco Use  . Smoking status: Former Smoker    Years: 55.00    Types: Cigarettes    Last attempt to quit: 12/25/2015    Years since quitting: 1.4  . Smokeless tobacco: Never Used  Substance Use Topics  . Alcohol use: Yes    Alcohol/week: 0.0 oz    Comment: Occasional  . Drug use: No     Allergies   Levofloxacin   Review of Systems Review of Systems  Unable to perform ROS: Acuity of condition  Constitutional: Positive for fever.  Respiratory: Positive for shortness of breath.      Physical Exam Updated Vital Signs BP (!) 134/53 (BP Location: Left Arm)   Pulse (!) 140   Temp 99.9 F (37.7 C) (Axillary)   Resp (!) 28   Ht 1.6 m (5\' 3" )   Wt 52.2 kg (115 lb)   SpO2 99%   BMI 20.37 kg/m  Rectal temp equals 105 Physical Exam  CONSTITUTIONAL: Elderly/frail, no acute distress HEAD: Normocephalic/atraumatic EYES: EOMI/PERRL ENMT: Mucous membranes moist, BiPAP mask in place NECK: supple no meningeal signs SPINE/BACK:entire spine nontender, kyphotic spine CV: Tachycardic LUNGS: Mild tachypnea, referred sounds from BiPAP ABDOMEN: soft, nontender GU:no cva tenderness NEURO: Pt is awake/alert/appropriate, moves all extremitiesx4.  No facial droop.   EXTREMITIES: pulses normal/equal, full ROM SKIN: warm, color normal PSYCH: no abnormalities of mood noted, alert and oriented to situation  ED Treatments / Results  Labs (all labs ordered are listed, but only abnormal results are displayed) Labs Reviewed  COMPREHENSIVE METABOLIC PANEL - Abnormal; Notable for the following components:      Result Value   Sodium 132 (*)    Chloride 95 (*)    Glucose, Bld 114 (*)    Calcium 8.7 (*)    Albumin 3.3 (*)    All other components within normal limits    CBC WITH DIFFERENTIAL/PLATELET - Abnormal; Notable for the following components:   WBC 11.0 (*)    Neutro Abs 9.0 (*)    Lymphs Abs 0.6 (*)    All other components within normal limits  CK - Abnormal; Notable for the following components:   Total CK 36 (*)    All other components within normal limits  BLOOD GAS, ARTERIAL - Abnormal; Notable for the following components:   pO2, Arterial 217 (*)    All other components within normal limits  CULTURE, BLOOD (ROUTINE X 2)  CULTURE, BLOOD (ROUTINE X 2)  URINE CULTURE  URINALYSIS, ROUTINE W REFLEX MICROSCOPIC  INFLUENZA PANEL BY PCR (TYPE A & B)  I-STAT CG4 LACTIC ACID, ED    EKG  EKG Interpretation  Date/Time:  Friday June 23 2017 06:34:02 EST Ventricular Rate:  137 PR Interval:    QRS Duration: 73 QT Interval:  282 QTC Calculation: 426 R Axis:   93 Text Interpretation:  Sinus tachycardia Right axis deviation Abnormal ekg No significant change since last tracing Interpretation limited secondary to artifact Confirmed by Zadie Rhine ([id number]) on 06/23/2017 6:43:30 AM       Radiology Dg Chest Portable 1 View  Result Date: 06/23/2017 CLINICAL DATA:  Shortness of breath EXAM: PORTABLE CHEST 1 VIEW COMPARISON:  06/03/2017 FINDINGS: Chronic hyperinflation and interstitial coarsening. There is no edema, consolidation, effusion, or pneumothorax. Nodule questioned on prior study is not seen today. Normal heart size and mediastinal contours. IMPRESSION: COPD without acute superimposed finding. Electronically Signed   By: Marnee Spring M.D.   On: 06/23/2017 07:07    Procedures Procedures  CRITICAL CARE Performed by: Joya Gaskins Total critical care time: 35 minutes Critical care time was exclusive of separately billable procedures and treating other patients. Critical care was necessary to treat or prevent imminent or life-threatening deterioration. Critical care was time spent personally by me on the following activities:  development of treatment plan with patient and/or surrogate as well as nursing, discussions with consultants, evaluation of patient's response to treatment, examination of patient, obtaining history from patient or surrogate, ordering and performing treatments and interventions, ordering and review of laboratory studies, ordering and review of radiographic studies, pulse oximetry and re-evaluation of patient's condition. Patient with sepsis, temperature greater than 105, requiring IV fluids and IV antibiotics, also requiring BiPAP and admission  Medications Ordered in ED Medications  vancomycin (VANCOCIN) IVPB 1000 mg/200 mL premix (1,000 mg Intravenous New Bag/Given 06/23/17 0712)  albuterol (PROVENTIL) (2.5 MG/3ML) 0.083% nebulizer solution 5 mg (5 mg Nebulization Given 06/23/17 0709)  acetaminophen (TYLENOL) suppository 650 mg (650 mg Rectal Given 06/23/17 0641)  sodium chloride 0.9 % bolus 1,000 mL (1,000 mLs Intravenous New Bag/Given 06/23/17 0642)    And  sodium chloride 0.9 % bolus 500 mL (500 mLs Intravenous New Bag/Given 06/23/17 0646)    And  sodium chloride 0.9 % bolus 250 mL (0 mLs Intravenous Stopped 06/23/17 0712)  piperacillin-tazobactam (ZOSYN) IVPB 3.375 g (0 g Intravenous Stopped 06/23/17 0713)  oseltamivir (TAMIFLU) capsule 75 mg (75 mg Oral Given 06/23/17 0649)  ibuprofen (ADVIL,MOTRIN) tablet 400 mg (400 mg Oral Given 06/23/17 1914)     Initial Impression / Assessment and Plan / ED Course  I have reviewed the triage vital signs and the nursing notes.  Pertinent labs & imaging results that were available during my care of the patient were reviewed by me and considered in my medical decision making (see chart for details).     6:50 AM Patient presents for generalized weakness and chills.  EMS reported that she was short of breath, and is improved with CPAP which has been transitioned to BiPAP.  Overall she is in no respiratory distress and is maintaining well on BiPAP at this  time. However she does have a rectal temp of 105.  Highly suspicious for influenza.  A code sepsis has been called, IV antibiotics have been ordered, but will also start Tamiflu. Tylenol and ibuprofen have  been ordered.  Cooling measures have been instituted, including ice packs. 7:02 AM She Is awake alert, appears improved.  She denies any pain at this time.  She reports use about 2 half liters of oxygen at home. 7:42 AM Patient is improving.  Heart rate is improving.  She has no mental status changes.  Her work of breathing  is much improved.  Will plan trial off BiPAP.  Lactate is normal.  Discussed with hospitalist Dr. Arbutus LeasAT for admission Final Clinical Impressions(s) / ED Diagnoses   Final diagnoses:  COPD exacerbation (HCC)  Sepsis, due to unspecified organism [facility](HCC)  Hyperpyrexia  Acute respiratory failure, unspecified whether with hypoxia or hypercapnia [facility](HCC)    ED Discharge Orders    None       Zadie RhineWickline, Jasir Rother, MD 06/23/17 [phone]

## 2017-06-24 DIAGNOSIS — J9621 Acute and chronic respiratory failure with hypoxia: Secondary | ICD-10-CM | POA: Diagnosis not present

## 2017-06-24 DIAGNOSIS — R509 Fever, unspecified: Secondary | ICD-10-CM | POA: Diagnosis not present

## 2017-06-24 DIAGNOSIS — A419 Sepsis, unspecified organism: Secondary | ICD-10-CM | POA: Diagnosis not present

## 2017-06-24 DIAGNOSIS — J441 Chronic obstructive pulmonary disease with (acute) exacerbation: Secondary | ICD-10-CM | POA: Diagnosis not present

## 2017-06-24 LAB — RESPIRATORY PANEL BY PCR
Adenovirus: NOT DETECTED
BORDETELLA PERTUSSIS-RVPCR: NOT DETECTED
CHLAMYDOPHILA PNEUMONIAE-RVPPCR: NOT DETECTED
CORONAVIRUS 229E-RVPPCR: NOT DETECTED
CORONAVIRUS HKU1-RVPPCR: NOT DETECTED
Coronavirus NL63: NOT DETECTED
Coronavirus OC43: NOT DETECTED
Influenza A: NOT DETECTED
Influenza B: NOT DETECTED
Metapneumovirus: NOT DETECTED
Mycoplasma pneumoniae: NOT DETECTED
Parainfluenza Virus 1: NOT DETECTED
Parainfluenza Virus 2: NOT DETECTED
Parainfluenza Virus 3: NOT DETECTED
Parainfluenza Virus 4: NOT DETECTED
RHINOVIRUS / ENTEROVIRUS - RVPPCR: NOT DETECTED
Respiratory Syncytial Virus: NOT DETECTED

## 2017-06-24 LAB — CBC
HEMATOCRIT: 32.9 % — AB (ref 36.0–46.0)
HEMOGLOBIN: 10.7 g/dL — AB (ref 12.0–15.0)
MCH: 31.9 pg (ref 26.0–34.0)
MCHC: 32.5 g/dL (ref 30.0–36.0)
MCV: 98.2 fL (ref 78.0–100.0)
Platelets: 345 10*3/uL (ref 150–400)
RBC: 3.35 MIL/uL — ABNORMAL LOW (ref 3.87–5.11)
RDW: 12.3 % (ref 11.5–15.5)
WBC: 8.1 10*3/uL (ref 4.0–10.5)

## 2017-06-24 LAB — BASIC METABOLIC PANEL
Anion gap: 7 (ref 5–15)
BUN: 9 mg/dL (ref 6–20)
CHLORIDE: 104 mmol/L (ref 101–111)
CO2: 24 mmol/L (ref 22–32)
CREATININE: 0.45 mg/dL (ref 0.44–1.00)
Calcium: 8 mg/dL — ABNORMAL LOW (ref 8.9–10.3)
GFR calc Af Amer: >60 mL/min (ref >60)
GFR calc non Af Amer: >60 mL/min (ref >60)
Glucose, Bld: 147 mg/dL — ABNORMAL HIGH (ref 65–99)
Potassium: 4 mmol/L (ref 3.5–5.1)
SODIUM: 135 mmol/L (ref 135–145)

## 2017-06-24 LAB — URINE CULTURE: CULTURE: NO GROWTH

## 2017-06-24 MED ORDER — PREDNISONE 10 MG PO TABS: 50.0000 mg | ORAL_TABLET | Freq: Every day | ORAL | 0 refills | Status: DC

## 2017-06-24 MED ORDER — IPRATROPIUM-ALBUTEROL 0.5-2.5 (3) MG/3ML IN SOLN
3.0000 mL | Freq: Three times a day (TID) | RESPIRATORY_TRACT | Status: DC
  Administered 2017-06-24 – 2017-06-25 (×3): 3 mL via RESPIRATORY_TRACT
  Filled 2017-06-24 (×3): qty 3

## 2017-06-24 MED ORDER — POTASSIUM CHLORIDE IN NACL 20-0.9 MEQ/L-% IV SOLN
INTRAVENOUS | Status: DC
  Administered 2017-06-24: 17:00:00 via INTRAVENOUS

## 2017-06-24 MED ORDER — PREDNISONE 20 MG PO TABS
50.0000 mg | ORAL_TABLET | Freq: Every day | ORAL | Status: DC
  Administered 2017-06-25: 50 mg via ORAL
  Filled 2017-06-24: qty 2

## 2017-06-24 NOTE — Progress Notes (Signed)
PROGRESS NOTE  Gerturde Kuba WUJ:[id number] DOB: 14-Jan-1946 DOA: 06/23/2017 PCP: [name], MD  Brief History:  72 y.o. female with medical history of COPD, chronic respiratory failure on 2.5 L at home, diastolic dysfunction, osteoporosis presenting with fevers, chills, and rigors that started when she woke up at 2 AM on the morning of 06/23/2017.  Patient states that she had been in her usual state of health without any recent complaints up until the morning of 06/23/2017.  She stated that there is not been any changes in her medicines, and she has not had any headaches, neck pain chest pain, shortness breath, coughing, vomiting, diarrhea, abdominal pain, dysuria, hematuria. She denies any recent sick contacts.  She states that she quit smoking 1 year ago after 60-pack-year history.  EMS was activated.  Apparently, the patient had some respiratory distress upon their arrival and was placed on CPAP.  She was placed on BiPAP in the emergency department initially, but was weaned off upon arrival.   In the emergency department, the patient had a rectal temperature of 105.0 F.  She was tachycardic into the 120s.  She remained hemodynamically stable.  She was initially placed on BiPAP with 100% saturation.  She was taken off BiPAP without any respiratory distress.  The patient received the appropriate fluid bolus.  She was started on vancomycin and Zosyn after blood cultures were obtained.  Assessment/Plan: Sepsis -source unclear -Lactic acid 0.87 -Check procalcitonin 0.92 -Continue vancomycin and Zosyn pending culture data -IV fluids -Influenza PCR negative -Urinalysis negative for pyuria  Acute on chronic respiratory failure with hypoxia -mild COPD exacerbation -Chronically on 2.5 L at home--now back to 2.5L -Wean oxygen back to home requirement -Continue Pulmicort -Continue duo nebs -Continue Solu-Medrol  COPD exacerbation -Patient was wheezing on examination -Steroids  and bronchodilators as above -Respiratory viral panel--neg   Disposition Plan:   Home 3/3 if stable Family Communication:   No Family at bedside  Consultants:  none  Code Status:  FULL  DVT Prophylaxis:  [place] Lovenox   Procedures: As Listed in Progress Note Above  Antibiotics: Vancomycin 3/1>>> Zosyn 3/1>>>    Subjective: Patient denies fevers, chills, headache, chest pain, dyspnea, nausea, vomiting, diarrhea, abdominal pain, dysuria, hematuria, hematochezia, and melena.   Objective: Vitals:   06/24/17 0141 06/24/17 0454 06/24/17 0759 06/24/17 0802  BP:  127/65    Pulse:  80    Resp:      Temp:  97.8 F (36.6 C)    TempSrc:  Oral    SpO2: 96% 97% 98% 98%  Weight:      Height:        Intake/Output Summary (Last 24 hours) at 06/24/2017 1119 Last data filed at 06/24/2017 9562 Gross per 24 hour  Intake 1776.25 ml  Output -  Net 1776.25 ml   Weight change: 4.336 kg (9 lb 9 oz) Exam:   General:  Pt is alert, follows commands appropriately, not in acute distress  HEENT: No icterus, No thrush, No neck mass, [place]/AT  Cardiovascular: RRR, S1/S2, no rubs, no gallops  Respiratory: Bibasilar rales without wheezing.  Good air movement.  Abdomen: Soft/+BS, non tender, non distended, no guarding  Extremities: No edema, No lymphangitis, No petechiae, No rashes, no synovitis   Data Reviewed: I have personally reviewed following labs and imaging studies Basic Metabolic Panel: Recent Labs  Lab 06/23/17 0625 06/24/17 0615  NA 132* 135  K 3.7 4.0  CL 95*  104  CO2 26 24  GLUCOSE 114* 147*  BUN 13 9  CREATININE 0.52 0.45  CALCIUM 8.7* 8.0*   Liver Function Tests: Recent Labs  Lab 06/23/17 0625  AST 16  ALT 18  ALKPHOS 58  BILITOT 0.3  PROT 6.9  ALBUMIN 3.3*   No results for input(s): LIPASE, AMYLASE in the last 168 hours. No results for input(s): AMMONIA in the last 168 hours. Coagulation Profile: Recent Labs  Lab 06/23/17 1221  INR 1.03    CBC: Recent Labs  Lab 06/23/17 0625 06/24/17 0615  WBC 11.0* 8.1  NEUTROABS 9.0*  --   HGB 12.4 10.7*  HCT 38.7 32.9*  MCV 97.7 98.2  PLT 365 345   Cardiac Enzymes: Recent Labs  Lab 06/23/17 0649  CKTOTAL 36*   BNP: Invalid input(s): POCBNP CBG: No results for input(s): GLUCAP in the last 168 hours. HbA1C: No results for input(s): HGBA1C in the last 72 hours. Urine analysis:    Component Value Date/Time   COLORURINE STRAW (A) 06/23/2017 0726   APPEARANCEUR CLEAR 06/23/2017 0726   LABSPEC 1.011 06/23/2017 0726   PHURINE 7.0 06/23/2017 0726   GLUCOSEU NEGATIVE 06/23/2017 0726   HGBUR NEGATIVE 06/23/2017 0726   BILIRUBINUR NEGATIVE 06/23/2017 0726   KETONESUR NEGATIVE 06/23/2017 0726   PROTEINUR NEGATIVE 06/23/2017 0726   NITRITE NEGATIVE 06/23/2017 0726   LEUKOCYTESUR NEGATIVE 06/23/2017 0726   Sepsis Labs: @LABRCNTIP (procalcitonin:4,lacticidven:4) ) Recent Results (from the past 240 hour(s))  Blood Culture (routine x 2)     Status: None (Preliminary result)   Collection Time: 06/23/17  6:42 AM  Result Value Ref Range Status   Specimen Description   Final    BLOOD LEFT HAND BOTTLES DRAWN AEROBIC AND ANAEROBIC   Special Requests Blood Culture adequate volume  Final   Culture   Final    NO GROWTH < 24 HOURS Performed at [facility], [address] [place], [place] [address]    Report Status PENDING  Incomplete  Blood Culture (routine x 2)     Status: None (Preliminary result)   Collection Time: 06/23/17  6:49 AM  Result Value Ref Range Status   Specimen Description   Final    BLOOD RIGHT HAND BOTTLES DRAWN AEROBIC AND ANAEROBIC   Special Requests Blood Culture adequate volume  Final   Culture   Final    NO GROWTH < 24 HOURS Performed at [facility], [address] [place], [place] [address]    Report Status PENDING  Incomplete  Respiratory Panel by PCR     Status: None   Collection Time: 06/23/17  1:42 PM  Result Value Ref Range Status    Adenovirus NOT DETECTED NOT DETECTED Final   Coronavirus 229E NOT DETECTED NOT DETECTED Final   Coronavirus HKU1 NOT DETECTED NOT DETECTED Final   Coronavirus NL63 NOT DETECTED NOT DETECTED Final   Coronavirus OC43 NOT DETECTED NOT DETECTED Final   Metapneumovirus NOT DETECTED NOT DETECTED Final   Rhinovirus / Enterovirus NOT DETECTED NOT DETECTED Final   Influenza A NOT DETECTED NOT DETECTED Final   Influenza B NOT DETECTED NOT DETECTED Final   Parainfluenza Virus 1 NOT DETECTED NOT DETECTED Final   Parainfluenza Virus 2 NOT DETECTED NOT DETECTED Final   Parainfluenza Virus 3 NOT DETECTED NOT DETECTED Final   Parainfluenza Virus 4 NOT DETECTED NOT DETECTED Final   Respiratory Syncytial Virus NOT DETECTED NOT DETECTED Final   Bordetella pertussis NOT DETECTED NOT DETECTED Final   Chlamydophila pneumoniae NOT DETECTED  NOT DETECTED Final   Mycoplasma pneumoniae NOT DETECTED NOT DETECTED Final  MRSA PCR Screening     Status: None   Collection Time: 06/23/17  1:42 PM  Result Value Ref Range Status   MRSA by PCR NEGATIVE NEGATIVE Final    Comment:        The GeneXpert MRSA Assay (FDA approved for NASAL specimens only), is one component of a comprehensive MRSA colonization surveillance program. It is not intended to diagnose MRSA infection nor to guide or monitor treatment for MRSA infections. Performed at [facility], [address] [place], [place] [address]      Scheduled Meds: . budesonide (PULMICORT) nebulizer solution  0.5 mg Nebulization BID  . enoxaparin (LOVENOX) injection  40 mg Subcutaneous Q24H  . ipratropium-albuterol  3 mL Nebulization TID  . methylPREDNISolone (SOLU-MEDROL) injection  40 mg Intravenous Q12H  . montelukast  10 mg Oral QHS  . multivitamin with minerals  1 tablet Oral Daily   Continuous Infusions: . 0.9 % NaCl with KCl 20 mEq / L 75 mL/hr at 06/23/17 1344  . piperacillin-tazobactam (ZOSYN)  IV 3.375 g (06/24/17 4696)  . vancomycin Stopped  (06/24/17 0730)    Procedures/Studies: Dg Chest Portable 1 View  Result Date: 06/23/2017 CLINICAL DATA:  Shortness of breath EXAM: PORTABLE CHEST 1 VIEW COMPARISON:  06/03/2017 FINDINGS: Chronic hyperinflation and interstitial coarsening. There is no edema, consolidation, effusion, or pneumothorax. Nodule questioned on prior study is not seen today. Normal heart size and mediastinal contours. IMPRESSION: COPD without acute superimposed finding. Electronically Signed   By: Marnee Spring M.D.   On: 06/23/2017 07:07   Dg Chest Port 1 View  Result Date: 06/03/2017 CLINICAL DATA:  Shortness of breath, history COPD, asthma EXAM: PORTABLE CHEST 1 VIEW COMPARISON:  Portable exam 2026 hours compared to 03/22/2017 FINDINGS: Normal heart size, mediastinal contours, and pulmonary vascularity. Atherosclerotic calcification aorta. Emphysematous changes without infiltrate, pleural effusion or pneumothorax. Questionable nodular density versus summation artifact lower RIGHT chest. Bones unremarkable. IMPRESSION: Emphysematous changes without acute infiltrate. Questionable nodular density lower RIGHT chest versus artifact; follow-up upright PA and lateral chest radiographs recommended. Electronically Signed   By: Ulyses Southward M.D.   On: 06/03/2017 20:52    Catarina Hartshorn, DO  [facility] Pager [phone]  If 7PM-7AM, please contact night-coverage [email or web address] Password [email or web address] 06/24/2017, 11:19 AM   LOS: 1 day

## 2017-06-24 NOTE — Discharge Summary (Signed)
Physician Discharge Summary  Grace Bushyatricia Jedlicka ZOX:[id number] DOB: 05/31/1945 DOA: 06/23/2017  PCP: Eartha InchBadger, Michael C, MD  Admit date: 06/23/2017 Discharge date: 06/25/2017  Admitted From: home Disposition:  Home  Recommendations for Outpatient Follow-up:  1. Follow up with PCP in 1-2 weeks 2. Please obtain BMP/CBC in one week    Discharge Condition: Stable CODE STATUS: FULL Diet recommendation: Heart Healthy    Brief/Interim Summary: 72 y.o.femalewith medical history ofCOPD, chronic respiratory failure on 2.5 L at home, diastolic dysfunction, osteoporosis presenting with fevers, chills, andrigorsthat started when she woke up at 2 AM on the morning of 06/23/2017. Patient states that she had been in her usual state of health without any recent complaints up until the morning of 06/23/2017. She stated that there is not been any changes in her medicines, and she has not had any headaches, neck pain chest pain, shortness breath, coughing, vomiting, diarrhea, abdominal pain, dysuria, hematuria. She denies any recent sick contacts. She states that she quit smoking 1 year ago after 60-pack-year history. EMS was activated. Apparently, the patient had some respiratory distress upon their arrival and was placed on CPAP. She was placed on BiPAP in the emergency department initially, but was weaned off upon arrival.  In the emergency department, the patient had a rectal temperature of 105.0 F.She was tachycardic into the120s.She remained hemodynamically stable. She was initially placed on BiPAP with 100% saturation. She was taken off BiPAP without any respiratory distress. The patient received the appropriate fluid bolus. She was started on vancomycin and Zosyn after blood cultures were obtained.  Blood cultures and urine cultures were negative.  Influenza and respiratory viral panel were negative.  Antibiotics were discontinued.  The patient remained afebrile hemodynamically stable  throughout the rest of the hospitalization.  She was adamant that she did not want to wait an additional 24 hours to monitor her clinical condition off antibiotics.  She stated that she wanted to follow-up with a primary care doctor should she began experiencing fevers and chills again.    Discharge Diagnoses:  Sepsis -source unclear -Patient defervesced within 24 hours and remained hemodynamically stable throughout the hospitalization. -Lactic acid 0.87 -Check procalcitonin 0.92 -Discontinued vancomycin and Zosyn when blood and urine cultures were negative -IV fluids -Influenza PCR negative -Urinalysis negative for pyuria -The patient was adamant that she did not want to stay in the hospital any longer to monitor her clinical condition off antibiotics for 24 hours.  She stated that she would rather follow-up with her primary care doctor if she began experiencing fevers and chills again.  Acute on chronic respiratory failure with hypoxia -mild COPD exacerbation -Chronically on 2.5 L at home--now back to 2.5L -Wean oxygen back to home requirement -Continue Pulmicort -Continue duo nebs -Continue Solu-Medrol>>>home with prednisone taper -restart symbicort and spiriva after discharge  COPD exacerbation -Patient was wheezing on examination -Steroids and bronchodilators as above -Respiratory viral panel--neg    Discharge Instructions   Allergies as of 06/24/2017      Reactions   Levofloxacin Shortness Of Breath      Medication List    TAKE these medications   alendronate 70 MG tablet Commonly known as:  FOSAMAX Take 70 mg by mouth every Sunday.   budesonide-formoterol 160-4.5 MCG/ACT inhaler Commonly known as:  SYMBICORT Inhale 2 puffs into the lungs 2 (two) times daily.   CALCIUM 1200 PO Take 1 tablet by mouth daily.   guaiFENesin 600 MG 12 hr tablet Commonly known as:  MUCINEX Take 1 tablet (600  mg total) by mouth 2 (two) times daily as needed for cough or to  loosen phlegm.   ipratropium-albuterol 0.5-2.5 (3) MG/3ML Soln Commonly known as:  DUONEB Take 3 mLs by nebulization 2 (two) times daily.   multivitamin capsule Take 1 capsule by mouth daily.   predniSONE 10 MG tablet Commonly known as:  DELTASONE Take 5 tablets (50 mg total) by mouth daily with breakfast. And decrease by one tablet daily Start taking on:  06/26/2017   Tiotropium Bromide Monohydrate 1.25 MCG/ACT Aers Inhale 2 sprays into the lungs daily.       Allergies  Allergen Reactions  . Levofloxacin Shortness Of Breath    Consultations:  none   Procedures/Studies: Dg Chest Portable 1 View  Result Date: 06/23/2017 CLINICAL DATA:  Shortness of breath EXAM: PORTABLE CHEST 1 VIEW COMPARISON:  06/03/2017 FINDINGS: Chronic hyperinflation and interstitial coarsening. There is no edema, consolidation, effusion, or pneumothorax. Nodule questioned on prior study is not seen today. Normal heart size and mediastinal contours. IMPRESSION: COPD without acute superimposed finding. Electronically Signed   By: Marnee Spring M.D.   On: 06/23/2017 07:07   Dg Chest Port 1 View  Result Date: 06/03/2017 CLINICAL DATA:  Shortness of breath, history COPD, asthma EXAM: PORTABLE CHEST 1 VIEW COMPARISON:  Portable exam 2026 hours compared to 03/22/2017 FINDINGS: Normal heart size, mediastinal contours, and pulmonary vascularity. Atherosclerotic calcification aorta. Emphysematous changes without infiltrate, pleural effusion or pneumothorax. Questionable nodular density versus summation artifact lower RIGHT chest. Bones unremarkable. IMPRESSION: Emphysematous changes without acute infiltrate. Questionable nodular density lower RIGHT chest versus artifact; follow-up upright PA and lateral chest radiographs recommended. Electronically Signed   By: Ulyses Southward M.D.   On: 06/03/2017 20:52        Discharge Exam: Vitals:   06/24/17 1450 06/24/17 1500  BP:  (!) 113/53  Pulse:  94  Resp:  18  Temp:   97.7 F (36.5 C)  SpO2: 98% 100%   Vitals:   06/24/17 0759 06/24/17 0802 06/24/17 1450 06/24/17 1500  BP:    (!) 113/53  Pulse:    94  Resp:    18  Temp:    97.7 F (36.5 C)  TempSrc:    Oral  SpO2: 98% 98% 98% 100%  Weight:      Height:        General: Pt is alert, awake, not in acute distress Cardiovascular: RRR, S1/S2 +, no rubs, no gallops Respiratory: bibasilar rales without wheeze Abdominal: Soft, NT, ND, bowel sounds + Extremities: no edema, no cyanosis   The results of significant diagnostics from this hospitalization (including imaging, microbiology, ancillary and laboratory) are listed below for reference.    Significant Diagnostic Studies: Dg Chest Portable 1 View  Result Date: 06/23/2017 CLINICAL DATA:  Shortness of breath EXAM: PORTABLE CHEST 1 VIEW COMPARISON:  06/03/2017 FINDINGS: Chronic hyperinflation and interstitial coarsening. There is no edema, consolidation, effusion, or pneumothorax. Nodule questioned on prior study is not seen today. Normal heart size and mediastinal contours. IMPRESSION: COPD without acute superimposed finding. Electronically Signed   By: Marnee Spring M.D.   On: 06/23/2017 07:07   Dg Chest Port 1 View  Result Date: 06/03/2017 CLINICAL DATA:  Shortness of breath, history COPD, asthma EXAM: PORTABLE CHEST 1 VIEW COMPARISON:  Portable exam 2026 hours compared to 03/22/2017 FINDINGS: Normal heart size, mediastinal contours, and pulmonary vascularity. Atherosclerotic calcification aorta. Emphysematous changes without infiltrate, pleural effusion or pneumothorax. Questionable nodular density versus summation artifact lower RIGHT chest. Bones  unremarkable. IMPRESSION: Emphysematous changes without acute infiltrate. Questionable nodular density lower RIGHT chest versus artifact; follow-up upright PA and lateral chest radiographs recommended. Electronically Signed   By: Ulyses Southward M.D.   On: 06/03/2017 20:52     Microbiology: Recent Results  (from the past 240 hour(s))  Blood Culture (routine x 2)     Status: None (Preliminary result)   Collection Time: 06/23/17  6:42 AM  Result Value Ref Range Status   Specimen Description   Final    BLOOD LEFT HAND BOTTLES DRAWN AEROBIC AND ANAEROBIC   Special Requests Blood Culture adequate volume  Final   Culture   Final    NO GROWTH < 24 HOURS Performed at [facility], [address], [place], [place] [address]    Report Status PENDING  Incomplete  Blood Culture (routine x 2)     Status: None (Preliminary result)   Collection Time: 06/23/17  6:49 AM  Result Value Ref Range Status   Specimen Description   Final    BLOOD RIGHT HAND BOTTLES DRAWN AEROBIC AND ANAEROBIC   Special Requests Blood Culture adequate volume  Final   Culture   Final    NO GROWTH < 24 HOURS Performed at [facility], [address] [place], [place] [address]    Report Status PENDING  Incomplete  Urine culture     Status: None   Collection Time: 06/23/17  7:26 AM  Result Value Ref Range Status   Specimen Description   Final    URINE, CATHETERIZED Performed at [facility], [address], [place], [place] [address]    Special Requests   Final    NONE Performed at [facility], [address], [place], [place] [address]    Culture   Final    NO GROWTH Performed at [facility], [address], [place], [place] [address]    Report Status 06/24/2017 FINAL  Final  Respiratory Panel by PCR     Status: None   Collection Time: 06/23/17  1:42 PM  Result Value Ref Range Status   Adenovirus NOT DETECTED NOT DETECTED Final   Coronavirus 229E NOT DETECTED NOT DETECTED Final   Coronavirus HKU1 NOT DETECTED NOT DETECTED Final   Coronavirus NL63 NOT DETECTED NOT DETECTED Final   Coronavirus OC43 NOT DETECTED NOT DETECTED Final   Metapneumovirus NOT DETECTED NOT DETECTED Final   Rhinovirus / Enterovirus NOT DETECTED NOT DETECTED Final   Influenza A NOT DETECTED NOT DETECTED Final   Influenza B NOT DETECTED  NOT DETECTED Final   Parainfluenza Virus 1 NOT DETECTED NOT DETECTED Final   Parainfluenza Virus 2 NOT DETECTED NOT DETECTED Final   Parainfluenza Virus 3 NOT DETECTED NOT DETECTED Final   Parainfluenza Virus 4 NOT DETECTED NOT DETECTED Final   Respiratory Syncytial Virus NOT DETECTED NOT DETECTED Final   Bordetella pertussis NOT DETECTED NOT DETECTED Final   Chlamydophila pneumoniae NOT DETECTED NOT DETECTED Final   Mycoplasma pneumoniae NOT DETECTED NOT DETECTED Final  MRSA PCR Screening     Status: None   Collection Time: 06/23/17  1:42 PM  Result Value Ref Range Status   MRSA by PCR NEGATIVE NEGATIVE Final    Comment:        The GeneXpert MRSA Assay (FDA approved for NASAL specimens only), is one component of a comprehensive MRSA colonization surveillance program. It is not intended to diagnose MRSA infection nor to guide or monitor treatment for MRSA infections. Performed at [facility], [phone]  [address], [place], [place] [address]      Labs: Basic Metabolic Panel: Recent Labs  Lab 06/23/17 0625 06/24/17 0615  NA 132* 135  K 3.7 4.0  CL 95* 104  CO2 26 24  GLUCOSE 114* 147*  BUN 13 9  CREATININE 0.52 0.45  CALCIUM 8.7* 8.0*   Liver Function Tests: Recent Labs  Lab 06/23/17 0625  AST 16  ALT 18  ALKPHOS 58  BILITOT 0.3  PROT 6.9  ALBUMIN 3.3*   No results for input(s): LIPASE, AMYLASE in the last 168 hours. No results for input(s): AMMONIA in the last 168 hours. CBC: Recent Labs  Lab 06/23/17 0625 06/24/17 0615  WBC 11.0* 8.1  NEUTROABS 9.0*  --   HGB 12.4 10.7*  HCT 38.7 32.9*  MCV 97.7 98.2  PLT 365 345   Cardiac Enzymes: Recent Labs  Lab 06/23/17 0649  CKTOTAL 36*   BNP: Invalid input(s): POCBNP CBG: No results for input(s): GLUCAP in the last 168 hours.  Time coordinating discharge:  Greater than 30 minutes  Signed:  Catarina Hartshorn, DO [facility] Pager: [phone] 06/24/2017, 4:23 PM

## 2017-06-25 DIAGNOSIS — A419 Sepsis, unspecified organism: Secondary | ICD-10-CM | POA: Diagnosis not present

## 2017-06-25 DIAGNOSIS — J441 Chronic obstructive pulmonary disease with (acute) exacerbation: Secondary | ICD-10-CM | POA: Diagnosis not present

## 2017-06-25 DIAGNOSIS — R509 Fever, unspecified: Secondary | ICD-10-CM | POA: Diagnosis not present

## 2017-06-25 DIAGNOSIS — J9621 Acute and chronic respiratory failure with hypoxia: Secondary | ICD-10-CM | POA: Diagnosis not present

## 2017-06-25 LAB — CBC
HCT: 33.8 % — ABNORMAL LOW (ref 36.0–46.0)
HEMOGLOBIN: 10.7 g/dL — AB (ref 12.0–15.0)
MCH: 31.8 pg (ref 26.0–34.0)
MCHC: 31.7 g/dL (ref 30.0–36.0)
MCV: 100.3 fL — ABNORMAL HIGH (ref 78.0–100.0)
Platelets: 422 10*3/uL — ABNORMAL HIGH (ref 150–400)
RBC: 3.37 MIL/uL — AB (ref 3.87–5.11)
RDW: 12.4 % (ref 11.5–15.5)
WBC: 8.8 10*3/uL (ref 4.0–10.5)

## 2017-06-25 LAB — PROCALCITONIN: PROCALCITONIN: 0.32 ng/mL

## 2017-06-25 MED ORDER — IPRATROPIUM-ALBUTEROL 0.5-2.5 (3) MG/3ML IN SOLN: 3.0000 mL | Freq: Two times a day (BID) | RESPIRATORY_TRACT | Status: DC

## 2017-06-25 NOTE — Plan of Care (Signed)
progressing 

## 2017-06-25 NOTE — Progress Notes (Signed)
Removed both IVs-clean, dry, intact. Reviewed d/c paperwork with pt and discussed where she could pick up her medications with her preprinted prescriptions. Pt originally wanted to take a Lift but did not have her credit card. She called her nephew that lives in [place] to pick her up. She did not want to wait in her room so Toniann FailWendy the NT wheeled stable pt and belongings including portable oxygen to wait in the lobby. Lequita HaltMorgan the charge nurse and [facility] to her sitting and waiting in the lobby.

## 2017-06-28 LAB — CULTURE, BLOOD (ROUTINE X 2)
Culture: NO GROWTH
Culture: NO GROWTH
SPECIAL REQUESTS: ADEQUATE
SPECIAL REQUESTS: ADEQUATE

## 2017-07-11 ENCOUNTER — Other Ambulatory Visit (HOSPITAL_BASED_OUTPATIENT_CLINIC_OR_DEPARTMENT_OTHER): Payer: Self-pay

## 2017-07-11 DIAGNOSIS — J441 Chronic obstructive pulmonary disease with (acute) exacerbation: Secondary | ICD-10-CM

## 2017-07-23 ENCOUNTER — Ambulatory Visit (HOSPITAL_BASED_OUTPATIENT_CLINIC_OR_DEPARTMENT_OTHER): Payer: Medicare Other | Attending: Physician Assistant | Admitting: Internal Medicine

## 2017-07-23 VITALS — Ht 63.0 in | Wt 117.0 lb

## 2017-07-23 DIAGNOSIS — J9690 Respiratory failure, unspecified, unspecified whether with hypoxia or hypercapnia: Secondary | ICD-10-CM | POA: Diagnosis not present

## 2017-07-23 DIAGNOSIS — G4736 Sleep related hypoventilation in conditions classified elsewhere: Secondary | ICD-10-CM | POA: Diagnosis not present

## 2017-07-23 DIAGNOSIS — I493 Ventricular premature depolarization: Secondary | ICD-10-CM | POA: Diagnosis not present

## 2017-07-23 DIAGNOSIS — J441 Chronic obstructive pulmonary disease with (acute) exacerbation: Secondary | ICD-10-CM

## 2017-07-23 DIAGNOSIS — J449 Chronic obstructive pulmonary disease, unspecified: Secondary | ICD-10-CM | POA: Diagnosis not present

## 2017-07-23 DIAGNOSIS — J9621 Acute and chronic respiratory failure with hypoxia: Secondary | ICD-10-CM

## 2017-07-29 DIAGNOSIS — J9621 Acute and chronic respiratory failure with hypoxia: Secondary | ICD-10-CM

## 2017-07-29 NOTE — Procedures (Signed)
  Patient Name: Leslie Porter, Leslie Porter Study Date: 07/23/2017 Gender: Female D.O.B: [birth year] Age (years): [age] Referring Provider: Tyrone NineSheri L Beal PA-C Height (inches): 63 Interpreting Physician: Jetty Duhamellinton Young MD, ABSM Weight (lbs): 117 RPSGT: Cherylann ParrDubili, Fred BMI: 21 MRN: [id number] Neck Size: 14.00  CLINICAL INFORMATION Sleep Study Type: NPSG Indication for sleep study: COPD, Fatigue, Snoring, Witnesses Apnea / Gasping During Sleep  Epworth Sleepiness Score: 3  SLEEP STUDY TECHNIQUE As per the AASM Manual for the Scoring of Sleep and Associated Events v2.3 (April 2016) with a hypopnea requiring 4% desaturations.  The channels recorded and monitored were frontal, central and occipital EEG, electrooculogram (EOG), submentalis EMG (chin), nasal and oral airflow, thoracic and abdominal wall motion, anterior tibialis EMG, snore microphone, electrocardiogram, and pulse oximetry.  MEDICATIONS Medications self-administered by patient taken the night of the study : none reported  SLEEP ARCHITECTURE The study was initiated at 10:23:53 PM and ended at 4:37:14 AM.  Sleep onset time was 14.7 minutes and the sleep efficiency was 67.2%%. The total sleep time was 251.0 minutes.  Stage REM latency was 36.5 minutes.  The patient spent 6.6%% of the night in stage N1 sleep, 82.3%% in stage N2 sleep, 0.2%% in stage N3 and 10.96% in REM.  Alpha intrusion was absent.  Supine sleep was 30.16%.  RESPIRATORY PARAMETERS The overall apnea/hypopnea index (AHI) was 1.0 per hour. There were 0 total apneas, including 0 obstructive, 0 central and 0 mixed apneas. There were 4 hypopneas and 0 RERAs.  The AHI during Stage REM sleep was 0.0 per hour.  AHI while supine was 0.8 per hour.  The mean oxygen saturation was 89.5%. The minimum SpO2 during sleep was 70.0%.  moderate snoring was noted during this study.  CARDIAC DATA The 2 lead EKG demonstrated sinus rhythm. The mean heart rate was 81.6 beats per minute.  Other EKG findings include: PVCs.  LEG MOVEMENT DATA The total PLMS were 0 with a resulting PLMS index of 0.0. Associated arousal with leg movement index was 2.2 .  IMPRESSIONS - No significant obstructive sleep apnea occurred during this study (AHI = 1.0/h). - No significant central sleep apnea occurred during this study (CAI = 0.0/h). - Severe oxygen desaturation was noted during this study (Min O2 = 70.0%). Mean 89.5%. Total time with room air saturation - The patient snored with moderate snoring volume. - EKG findings include PVCs. - Clinically significant periodic limb movements did not occur during sleep. No significant associated arousals.  DIAGNOSIS - Nocturnal Hypoxemia (327.26 [G47.36 ICD-10])  RECOMMENDATIONS - Supplemental O2 during sleep. Evaluate for cardiopulonary basis for hypoxemia. - Be careful with alcohol, sedatives and other CNS depressants that may worsen sleep apnea and disrupt normal sleep architecture. - Sleep hygiene should be reviewed to assess factors that may improve sleep quality. - Weight management and regular exercise should be initiated or continued if appropriate.  [Electronically signed] 07/29/2017 12:29 PM  Jetty Duhamellinton Young MD, ABSM Diplomate, American Board of Sleep Medicine   NPI: [id number]                           Jetty Duhamellinton Young Diplomate, American Board of Sleep Medicine  ELECTRONICALLY SIGNED ON:  07/29/2017, 12:25 PM [facility] PH: [phone]   FX: [phone] ACCREDITED BY THE AMERICAN ACADEMY OF SLEEP MEDICINE

## 2017-08-17 ENCOUNTER — Telehealth: Payer: Self-pay | Admitting: Internal Medicine

## 2017-08-17 NOTE — Telephone Encounter (Signed)
Called and spoke to Kingstonhristy with Sherry's office at [facility]. They were unaware that patient hasn't been to our office since 10/04/16 and has missed appointments. Neysa BonitoChristy stated they didn't need anything currently but will follow up with patient. Nothing needed at this time.

## 2017-10-01 ENCOUNTER — Emergency Department (HOSPITAL_COMMUNITY): Payer: Medicare Other

## 2017-10-01 ENCOUNTER — Inpatient Hospital Stay (HOSPITAL_COMMUNITY)
Admission: EM | Admit: 2017-10-01 | Discharge: 2017-10-23 | DRG: 296 | Disposition: E | Payer: Medicare Other | Attending: Pulmonary Disease | Admitting: Pulmonary Disease

## 2017-10-01 DIAGNOSIS — Z7951 Long term (current) use of inhaled steroids: Secondary | ICD-10-CM

## 2017-10-01 DIAGNOSIS — J45909 Unspecified asthma, uncomplicated: Secondary | ICD-10-CM | POA: Diagnosis present

## 2017-10-01 DIAGNOSIS — G253 Myoclonus: Secondary | ICD-10-CM | POA: Diagnosis not present

## 2017-10-01 DIAGNOSIS — I468 Cardiac arrest due to other underlying condition: Secondary | ICD-10-CM | POA: Diagnosis present

## 2017-10-01 DIAGNOSIS — R0602 Shortness of breath: Secondary | ICD-10-CM | POA: Diagnosis present

## 2017-10-01 DIAGNOSIS — Z881 Allergy status to other antibiotic agents status: Secondary | ICD-10-CM | POA: Diagnosis not present

## 2017-10-01 DIAGNOSIS — T68XXXA Hypothermia, initial encounter: Secondary | ICD-10-CM | POA: Diagnosis present

## 2017-10-01 DIAGNOSIS — J9383 Other pneumothorax: Secondary | ICD-10-CM | POA: Diagnosis present

## 2017-10-01 DIAGNOSIS — Z9981 Dependence on supplemental oxygen: Secondary | ICD-10-CM | POA: Diagnosis not present

## 2017-10-01 DIAGNOSIS — R402312 Coma scale, best motor response, none, at arrival to emergency department: Secondary | ICD-10-CM | POA: Diagnosis present

## 2017-10-01 DIAGNOSIS — J9601 Acute respiratory failure with hypoxia: Secondary | ICD-10-CM | POA: Diagnosis present

## 2017-10-01 DIAGNOSIS — R402112 Coma scale, eyes open, never, at arrival to emergency department: Secondary | ICD-10-CM | POA: Diagnosis present

## 2017-10-01 DIAGNOSIS — I469 Cardiac arrest, cause unspecified: Secondary | ICD-10-CM | POA: Diagnosis present

## 2017-10-01 DIAGNOSIS — D72829 Elevated white blood cell count, unspecified: Secondary | ICD-10-CM | POA: Diagnosis present

## 2017-10-01 DIAGNOSIS — Z79899 Other long term (current) drug therapy: Secondary | ICD-10-CM

## 2017-10-01 DIAGNOSIS — Z7983 Long term (current) use of bisphosphonates: Secondary | ICD-10-CM

## 2017-10-01 DIAGNOSIS — R739 Hyperglycemia, unspecified: Secondary | ICD-10-CM | POA: Diagnosis present

## 2017-10-01 DIAGNOSIS — J9622 Acute and chronic respiratory failure with hypercapnia: Secondary | ICD-10-CM | POA: Diagnosis not present

## 2017-10-01 DIAGNOSIS — S2231XA Fracture of one rib, right side, initial encounter for closed fracture: Secondary | ICD-10-CM | POA: Diagnosis present

## 2017-10-01 DIAGNOSIS — I5032 Chronic diastolic (congestive) heart failure: Secondary | ICD-10-CM | POA: Diagnosis present

## 2017-10-01 DIAGNOSIS — G931 Anoxic brain damage, not elsewhere classified: Secondary | ICD-10-CM | POA: Diagnosis present

## 2017-10-01 DIAGNOSIS — J9621 Acute and chronic respiratory failure with hypoxia: Secondary | ICD-10-CM | POA: Diagnosis present

## 2017-10-01 DIAGNOSIS — Z87891 Personal history of nicotine dependence: Secondary | ICD-10-CM | POA: Diagnosis not present

## 2017-10-01 DIAGNOSIS — J939 Pneumothorax, unspecified: Secondary | ICD-10-CM

## 2017-10-01 DIAGNOSIS — Z515 Encounter for palliative care: Secondary | ICD-10-CM | POA: Diagnosis present

## 2017-10-01 DIAGNOSIS — J989 Respiratory disorder, unspecified: Secondary | ICD-10-CM

## 2017-10-01 DIAGNOSIS — M810 Age-related osteoporosis without current pathological fracture: Secondary | ICD-10-CM | POA: Diagnosis present

## 2017-10-01 DIAGNOSIS — Y848 Other medical procedures as the cause of abnormal reaction of the patient, or of later complication, without mention of misadventure at the time of the procedure: Secondary | ICD-10-CM | POA: Diagnosis present

## 2017-10-01 DIAGNOSIS — J449 Chronic obstructive pulmonary disease, unspecified: Secondary | ICD-10-CM | POA: Diagnosis not present

## 2017-10-01 DIAGNOSIS — Z66 Do not resuscitate: Secondary | ICD-10-CM

## 2017-10-01 DIAGNOSIS — E872 Acidosis: Secondary | ICD-10-CM | POA: Diagnosis present

## 2017-10-01 DIAGNOSIS — Z90710 Acquired absence of both cervix and uterus: Secondary | ICD-10-CM | POA: Diagnosis not present

## 2017-10-01 DIAGNOSIS — R21 Rash and other nonspecific skin eruption: Secondary | ICD-10-CM | POA: Diagnosis present

## 2017-10-01 DIAGNOSIS — S2239XA Fracture of one rib, unspecified side, initial encounter for closed fracture: Secondary | ICD-10-CM | POA: Diagnosis present

## 2017-10-01 DIAGNOSIS — R402212 Coma scale, best verbal response, none, at arrival to emergency department: Secondary | ICD-10-CM | POA: Diagnosis present

## 2017-10-01 HISTORY — DX: Acute respiratory failure with hypoxia: J96.01

## 2017-10-01 LAB — COMPREHENSIVE METABOLIC PANEL
ALT: 112 U/L — ABNORMAL HIGH (ref 14–54)
ANION GAP: 14 (ref 5–15)
AST: 138 U/L — ABNORMAL HIGH (ref 15–41)
Albumin: 3.3 g/dL — ABNORMAL LOW (ref 3.5–5.0)
Alkaline Phosphatase: 78 U/L (ref 38–126)
BILIRUBIN TOTAL: 1 mg/dL (ref 0.3–1.2)
BUN: 14 mg/dL (ref 6–20)
CO2: 19 mmol/L — ABNORMAL LOW (ref 22–32)
Calcium: 8.7 mg/dL — ABNORMAL LOW (ref 8.9–10.3)
Chloride: 101 mmol/L (ref 101–111)
Creatinine, Ser: 0.9 mg/dL (ref 0.44–1.00)
GFR calc Af Amer: >60 mL/min (ref >60)
Glucose, Bld: 214 mg/dL — ABNORMAL HIGH (ref 65–99)
Potassium: 5.7 mmol/L — ABNORMAL HIGH (ref 3.5–5.1)
Sodium: 134 mmol/L — ABNORMAL LOW (ref 135–145)
TOTAL PROTEIN: 5.8 g/dL — AB (ref 6.5–8.1)

## 2017-10-01 LAB — TROPONIN I

## 2017-10-01 LAB — CBC WITH DIFFERENTIAL/PLATELET
Abs Immature Granulocytes: 0.5 10*3/uL — ABNORMAL HIGH (ref 0.0–0.1)
Basophils Absolute: 0.1 10*3/uL (ref 0.0–0.1)
Basophils Relative: 1 %
EOS ABS: 0.6 10*3/uL (ref 0.0–0.7)
EOS PCT: 4 %
HEMATOCRIT: 45.8 % (ref 36.0–46.0)
Hemoglobin: 14.5 g/dL (ref 12.0–15.0)
Immature Granulocytes: 4 %
LYMPHS ABS: 4.4 10*3/uL — AB (ref 0.7–4.0)
Lymphocytes Relative: 30 %
MCH: 31.9 pg (ref 26.0–34.0)
MCHC: 31.7 g/dL (ref 30.0–36.0)
MCV: 100.7 fL — AB (ref 78.0–100.0)
MONOS PCT: 7 %
Monocytes Absolute: 1 10*3/uL (ref 0.1–1.0)
Neutro Abs: 8 10*3/uL — ABNORMAL HIGH (ref 1.7–7.7)
Neutrophils Relative %: 54 %
Platelets: 386 10*3/uL (ref 150–400)
RBC: 4.55 MIL/uL (ref 3.87–5.11)
RDW: 14 % (ref 11.5–15.5)
WBC: 14.5 10*3/uL — AB (ref 4.0–10.5)

## 2017-10-01 LAB — I-STAT ARTERIAL BLOOD GAS, ED
ACID-BASE EXCESS: 23 mmol/L — AB (ref 0.0–2.0)
BICARBONATE: 52.4 mmol/L — AB (ref 20.0–28.0)
O2 Saturation: 100 %
TCO2: >50 mmol/L — ABNORMAL HIGH (ref 22–32)
pCO2 arterial: 81.4 mmHg (ref 32.0–48.0)
pH, Arterial: 7.406 (ref 7.350–7.450)
pO2, Arterial: 569 mmHg — ABNORMAL HIGH (ref 83.0–108.0)

## 2017-10-01 MED ORDER — DIPHENHYDRAMINE HCL 50 MG/ML IJ SOLN
25.0000 mg | Freq: Four times a day (QID) | INTRAMUSCULAR | Status: DC
  Administered 2017-10-02 (×2): 25 mg via INTRAVENOUS
  Filled 2017-10-01 (×2): qty 1

## 2017-10-01 MED ORDER — IPRATROPIUM-ALBUTEROL 0.5-2.5 (3) MG/3ML IN SOLN
3.0000 mL | Freq: Four times a day (QID) | RESPIRATORY_TRACT | Status: DC
  Administered 2017-10-02 (×2): 3 mL via RESPIRATORY_TRACT
  Filled 2017-10-01 (×2): qty 3

## 2017-10-01 MED ORDER — BUDESONIDE 0.5 MG/2ML IN SUSP
0.5000 mg | Freq: Two times a day (BID) | RESPIRATORY_TRACT | Status: DC
  Administered 2017-10-02: 0.5 mg via RESPIRATORY_TRACT
  Filled 2017-10-01 (×2): qty 2

## 2017-10-01 MED ORDER — NOREPINEPHRINE 4 MG/250ML-% IV SOLN
0.0000 ug/min | INTRAVENOUS | Status: DC
  Filled 2017-10-01: qty 250

## 2017-10-01 MED ORDER — HEPARIN SODIUM (PORCINE) 5000 UNIT/ML IJ SOLN
5000.0000 [IU] | Freq: Three times a day (TID) | INTRAMUSCULAR | Status: DC
  Administered 2017-10-02: 5000 [IU] via SUBCUTANEOUS
  Filled 2017-10-01: qty 1

## 2017-10-01 MED ORDER — SODIUM CHLORIDE 0.9 % IV BOLUS
1000.0000 mL | Freq: Once | INTRAVENOUS | Status: AC
  Administered 2017-10-01: 1000 mL via INTRAVENOUS

## 2017-10-01 MED ORDER — FENTANYL CITRATE (PF) 100 MCG/2ML IJ SOLN
50.0000 ug | INTRAMUSCULAR | Status: DC | PRN
Start: 2017-10-01 — End: 2017-10-01

## 2017-10-01 MED ORDER — IOPAMIDOL (ISOVUE-370) INJECTION 76%
INTRAVENOUS | Status: AC
  Filled 2017-10-01: qty 100

## 2017-10-01 MED ORDER — METHYLPREDNISOLONE SODIUM SUCC 40 MG IJ SOLR
40.0000 mg | Freq: Four times a day (QID) | INTRAMUSCULAR | Status: DC
  Administered 2017-10-02: 40 mg via INTRAVENOUS
  Filled 2017-10-01: qty 1

## 2017-10-01 MED ORDER — FENTANYL 2500MCG IN NS 250ML (10MCG/ML) PREMIX INFUSION
25.0000 ug/h | INTRAVENOUS | Status: DC
  Administered 2017-10-01: 50 ug/h via INTRAVENOUS
  Administered 2017-10-02: 300 ug/h via INTRAVENOUS
  Filled 2017-10-01 (×2): qty 250

## 2017-10-01 MED ORDER — FENTANYL CITRATE (PF) 100 MCG/2ML IJ SOLN
INTRAMUSCULAR | Status: AC
  Filled 2017-10-01: qty 2

## 2017-10-01 MED ORDER — SODIUM CHLORIDE 0.9 % IV SOLN
INTRAVENOUS | Status: DC
  Administered 2017-10-02: 75 mL/h via INTRAVENOUS

## 2017-10-01 MED ORDER — METHYLPREDNISOLONE SODIUM SUCC 125 MG IJ SOLR
125.0000 mg | Freq: Once | INTRAMUSCULAR | Status: AC
  Administered 2017-10-01: 125 mg via INTRAVENOUS
  Filled 2017-10-01: qty 2

## 2017-10-01 MED ORDER — FENTANYL BOLUS VIA INFUSION
25.0000 ug | INTRAVENOUS | Status: DC | PRN
  Filled 2017-10-01: qty 25

## 2017-10-01 MED ORDER — MIDAZOLAM HCL 2 MG/2ML IJ SOLN
1.0000 mg | INTRAMUSCULAR | Status: DC | PRN
  Administered 2017-10-02 (×2): 1 mg via INTRAVENOUS
  Filled 2017-10-01 (×2): qty 2

## 2017-10-01 MED ORDER — PANTOPRAZOLE SODIUM 40 MG PO PACK
40.0000 mg | PACK | Freq: Every day | ORAL | Status: DC
  Administered 2017-10-02: 40 mg
  Filled 2017-10-01: qty 20

## 2017-10-01 MED ORDER — FENTANYL CITRATE (PF) 100 MCG/2ML IJ SOLN
INTRAMUSCULAR | Status: AC
  Administered 2017-10-01: 50 ug
  Filled 2017-10-01: qty 2

## 2017-10-01 MED ORDER — NOREPINEPHRINE 4 MG/250ML-% IV SOLN
0.0000 ug/min | Freq: Once | INTRAVENOUS | Status: AC
  Administered 2017-10-01: 2 ug/min via INTRAVENOUS
  Filled 2017-10-01: qty 250

## 2017-10-01 MED ORDER — MIDAZOLAM HCL 2 MG/2ML IJ SOLN
1.0000 mg | INTRAMUSCULAR | Status: DC | PRN
  Filled 2017-10-01: qty 2

## 2017-10-01 MED ORDER — INSULIN ASPART 100 UNIT/ML ~~LOC~~ SOLN
0.0000 [IU] | SUBCUTANEOUS | Status: DC
  Administered 2017-10-02 (×2): 3 [IU] via SUBCUTANEOUS
  Administered 2017-10-02: 2 [IU] via SUBCUTANEOUS

## 2017-10-01 MED ORDER — MIDAZOLAM HCL 2 MG/2ML IJ SOLN
INTRAMUSCULAR | Status: AC
  Filled 2017-10-01: qty 2

## 2017-10-01 MED ORDER — FENTANYL CITRATE (PF) 100 MCG/2ML IJ SOLN
50.0000 ug | Freq: Once | INTRAMUSCULAR | Status: AC
  Administered 2017-10-01: 50 ug via INTRAVENOUS

## 2017-10-01 MED ORDER — FENTANYL CITRATE (PF) 100 MCG/2ML IJ SOLN: 50.0000 ug | INTRAMUSCULAR | Status: DC | PRN

## 2017-10-01 MED ORDER — SODIUM BICARBONATE 8.4 % IV SOLN
INTRAVENOUS | Status: AC
  Administered 2017-10-01: 50 meq
  Filled 2017-10-01: qty 50

## 2017-10-01 NOTE — ED Provider Notes (Signed)
[facility] EMERGENCY DEPARTMENT Provider Note   CSN: [id number] Arrival date & time: 10/15/2017  2206     History   Chief Complaint Chief Complaint  Patient presents with  . Shortness of Breath    HPI Leslie Porter is a 72 y.o. female.  The history is provided by the EMS personnel. The history is limited by the condition of the patient.   72 yo F with PMHx COPD (2.5L [place] at baseline) who presents via EMS after cardiac arrest. Reportedly called 911 on her own with complaint of shortness of breath. Went unresponsive during call. On EMS arrival, found pulseless, apneic, Asystole on monitor. There was an albuterol inhaler next to her on the ground. CPR by EMS for total 15 minutes with 1mg  epi with ROSC. Intubated. Strong pulse on arrival, agonal respirations, no purposeful movement.   Past Medical History:  Diagnosis Date  . Asthma   . COPD (chronic obstructive pulmonary disease) (HCC)   . Diastolic dysfunction   . Osteoporosis     Patient Active Problem List   Diagnosis Date Noted  . Cardiac arrest (HCC) 10/01/2017  . Sepsis (HCC) 06/23/2017  . Acute on chronic respiratory failure with hypoxia (HCC) 06/23/2017  . COPD with exacerbation (HCC) 06/04/2017  . Gastroesophageal reflux disease   . Hypokalemia 03/22/2017  . Acute on chronic respiratory failure (HCC) 03/22/2017  . Acute respiratory failure (HCC) 03/16/2017  . Elevated MCV 03/16/2017  . Diastolic dysfunction 03/16/2017  . Acute on chronic respiratory failure with hypoxia and hypercapnia (HCC) 03/16/2017  . Nicotine abuse 03/16/2017  . COPD exacerbation (HCC)   . Ex-cigarette smoker 10/04/2016  . Palliative care encounter   . Goals of care, counseling/discussion   . DNR (do not resuscitate) discussion   . Acute hypoxemic respiratory failure (HCC) 12/31/2015  . COPD with acute exacerbation (HCC) 12/31/2015  . CAP (community acquired pneumonia) 12/31/2015  . Respiratory failure (HCC) 12/31/2015    . COPD mixed type (HCC) 06/24/2015  . Dyspnea 06/24/2015    Past Surgical History:  Procedure Laterality Date  . VAGINAL HYSTERECTOMY       OB History   None      Home Medications    Prior to Admission medications   Medication Sig Start Date End Date Taking? Authorizing Provider  alendronate (FOSAMAX) 70 MG tablet Take 70 mg by mouth every Sunday.  12/29/15   [provider]  budesonide-formoterol (SYMBICORT) 160-4.5 MCG/ACT inhaler Inhale 2 puffs into the lungs 2 (two) times daily.    [provider]  Calcium Carbonate-Vit D-Min (CALCIUM 1200 PO) Take 1 tablet by mouth daily.    [provider]  guaiFENesin (MUCINEX) 600 MG 12 hr tablet Take 1 tablet (600 mg total) by mouth 2 (two) times daily as needed for cough or to loosen phlegm. 01/15/16   Mikhail, Nita Sells, DO  ipratropium-albuterol (DUONEB) 0.5-2.5 (3) MG/3ML SOLN Take 3 mLs by nebulization 2 (two) times daily.    [provider]  Multiple Vitamin (MULTIVITAMIN) capsule Take 1 capsule by mouth daily.    [provider]  predniSONE (DELTASONE) 10 MG tablet Take 5 tablets (50 mg total) by mouth daily with breakfast. And decrease by one tablet daily 06/26/17   Tat, Onalee Hua, MD  Tiotropium Bromide Monohydrate 1.25 MCG/ACT AERS Inhale 2 sprays into the lungs daily. 04/05/17   [provider]    Family History Family History  Problem Relation Age of Onset  . Heart attack Father   . Heart  disease Mother     Social History Social History   Tobacco Use  . Smoking status: Former Smoker    Years: 55.00    Types: Cigarettes    Last attempt to quit: 12/25/2015    Years since quitting: 1.7  . Smokeless tobacco: Never Used  Substance Use Topics  . Alcohol use: Yes    Alcohol/week: 0.0 oz    Comment: Occasional  . Drug use: No     Allergies   Levofloxacin   Review of Systems Review of Systems  Unable to perform ROS: Intubated     Physical Exam Updated Vital  Signs BP (!) 106/50   Pulse (!) 29   Temp (!) 94.6 F (34.8 C)   Resp (!) 29   Ht 5\' 2"  (1.575 m)   SpO2 100%   BMI 21.40 kg/m   Physical Exam  Constitutional: She appears well-developed.  HENT:  Head: Normocephalic and atraumatic.  ETT in place  Eyes: Conjunctivae are normal.  Pinpoint pupils bilaterally  Neck: Neck supple.  Cardiovascular: Regular rhythm and intact distal pulses. Tachycardia present.  Pulmonary/Chest:  Coarse breath sounds bilaterally  Abdominal: Soft. She exhibits no distension.  Musculoskeletal: She exhibits no edema or deformity.  Neurological: She exhibits normal muscle tone. GCS eye subscore is 1. GCS verbal subscore is 1. GCS motor subscore is 1.  Skin: Skin is warm and dry. There is erythema (diffuse erythema to neck, chest, b/l upper extremities, bilateral lower extremities proximal to knees ).  Psychiatric: She has a normal mood and affect.  Nursing note and vitals reviewed.    ED Treatments / Results  Labs (all labs ordered are listed, but only abnormal results are displayed) Labs Reviewed  CBC WITH DIFFERENTIAL/PLATELET - Abnormal; Notable for the following components:      Result Value   WBC 14.5 (*)    MCV 100.7 (*)    Neutro Abs 8.0 (*)    Lymphs Abs 4.4 (*)    Abs Immature Granulocytes 0.5 (*)    All other components within normal limits  I-STAT ARTERIAL BLOOD GAS, ED - Abnormal; Notable for the following components:   pCO2 arterial 81.4 (*)    pO2, Arterial 569.0 (*)    Bicarbonate 52.4 (*)    TCO2 >50 (*)    Acid-Base Excess 23.0 (*)    All other components within normal limits  CULTURE, BLOOD (ROUTINE X 2)  CULTURE, BLOOD (ROUTINE X 2)  CULTURE, RESPIRATORY (NON-EXPECTORATED)  RESPIRATORY PANEL BY PCR  COMPREHENSIVE METABOLIC PANEL  TROPONIN I  TROPONIN I  TROPONIN I  TROPONIN I  BASIC METABOLIC PANEL  BASIC METABOLIC PANEL  PROTIME-INR  APTT  BLOOD GAS, ARTERIAL  LACTIC ACID, PLASMA  LACTIC ACID, PLASMA   PROCALCITONIN  PROCALCITONIN  URINALYSIS, ROUTINE W REFLEX MICROSCOPIC  BRAIN NATRIURETIC PEPTIDE  RAPID URINE DRUG SCREEN, HOSP PERFORMED  STREP PNEUMONIAE URINARY ANTIGEN  CORTISOL    EKG EKG Interpretation  Date/Time:  Sunday October 01 2017 22:11:17 EDT Ventricular Rate:  107 PR Interval:    QRS Duration: 98 QT Interval:  345 QTC Calculation: 461 R Axis:   91 Text Interpretation:  Sinus tachycardia Probable left atrial enlargement Right axis deviation Probable anteroseptal infarct, old Abnormal ekg Confirmed by Gerhard Munch [id number]) on 09/23/2017 10:20:11 PM   Radiology Dg Chest Port 1 View  Result Date: 10/08/2017 CLINICAL DATA:  Endotracheal tube and OG tube placement. EXAM: PORTABLE CHEST 1 VIEW COMPARISON:  None. FINDINGS: Endotracheal tube tip 5.1 cm from  the carina. Enteric tube tip and side-port below the diaphragm not included in the field of view, likely in the stomach. Multiple overlying monitoring devices. Possible tiny right apical pneumothorax seen only on one view. The lungs are hyperinflated with bronchial thickening. Slight increased perihilar opacities may be bronchitic or congestive. Normal heart size with aortic atherosclerosis. Normal mediastinal contours. No focal airspace disease. No large pleural effusion. Right anterior rib fractures, uncertain acuity. Fracture of eighth rib is minimally displaced, probable ninth and tenth ribs anteriorly. IMPRESSION: 1. Endotracheal tube tip 5.1 cm from the carina. Enteric tube in place with tip and side-port below the diaphragm likely in the stomach not included in the field of view. 2. Possible small right apical pneumothorax. Right anterior rib fractures of uncertain acuity, eighth rib fracture peers minimally displaced. 3. Hyperinflation and bronchial thickening. Increased perihilar opacities may be bronchitic or congestive. These results were called by telephone at the time of interpretation on 10/11/2017 at 11:22 pm to Dr.  Jeraldine LootsLockwood , who verbally acknowledged these results. Electronically Signed   By: Rubye OaksMelanie  Ehinger M.D.   On: 09/25/2017 23:22    Procedures Procedures (including critical care time)  Medications Ordered in ED Medications  midazolam (VERSED) injection 1 mg (has no administration in time range)  midazolam (VERSED) injection 1 mg (has no administration in time range)  iopamidol (ISOVUE-370) 76 % injection (has no administration in time range)  fentaNYL (SUBLIMAZE) 100 MCG/2ML injection (has no administration in time range)  midazolam (VERSED) 2 MG/2ML injection (has no administration in time range)  norepinephrine (LEVOPHED) 4mg  in D5W 250mL premix infusion (has no administration in time range)  heparin injection 5,000 Units (has no administration in time range)  0.9 %  sodium chloride infusion (has no administration in time range)  sodium chloride 0.9 % bolus 1,000 mL (has no administration in time range)  fentaNYL 2500mcg in NS 250mL (5710mcg/ml) infusion-PREMIX (has no administration in time range)  fentaNYL (SUBLIMAZE) bolus via infusion 25 mcg (has no administration in time range)  pantoprazole sodium (PROTONIX) 40 mg/20 mL oral suspension 40 mg (has no administration in time range)  fentaNYL (SUBLIMAZE) 100 MCG/2ML injection (has no administration in time range)  ipratropium-albuterol (DUONEB) 0.5-2.5 (3) MG/3ML nebulizer solution 3 mL (has no administration in time range)  budesonide (PULMICORT) nebulizer solution 0.5 mg (has no administration in time range)  methylPREDNISolone sodium succinate (SOLU-MEDROL) 125 mg/2 mL injection 125 mg (125 mg Intravenous Given 10/21/2017 2240)  norepinephrine (LEVOPHED) 4mg  in D5W 250mL premix infusion (7.5 mcg/min Intravenous Rate/Dose Change 10/13/2017 2338)  sodium bicarbonate 1 mEq/mL injection (50 mEq  Given 10/08/2017 2255)  fentaNYL (SUBLIMAZE) injection 50 mcg (50 mcg Intravenous Given 09/30/2017 2335)     Initial Impression / Assessment and Plan / ED Course   I have reviewed the triage vital signs and the nursing notes.  Pertinent labs & imaging results that were available during my care of the patient were reviewed by me and considered in my medical decision making (see chart for details).     Leslie Porter is a 72 y.o. female with PMHx of COPD who presents post cardiac arrest. Called out for dyspnea. Reviewed and confirmed nursing documentation for past medical history, family history, social history. VS normotensive, hypothermic. Presumably cardiac arrest 2/2 respiratory arrest, suspect secondary to COPD exacerbation. Strong pulses on arrival with agonal respiratory effort.   EKG with sinus tachycardia, no STE/D. IV solumedrol given. Placed on mechanical ventilation here with intermittent sedation prn. PH wnl, pCO2  elevated 82. CBC with leukocytosis 14.5.  CXR with small R apical PTX, R anterior rib fractures suspect secondary to compressions. Became hypotensive, started levophed.   Old records reviewed. Labs reviewed by me and used in the medical decision making.  Imaging viewed and interpreted by me and used in the medical decision making (formal interpretation from radiologist). EKG reviewed by me and used in the medical decision making. Admitted to ICU.    Final Clinical Impressions(s) / ED Diagnoses   Final diagnoses:  Cardiac arrest due to respiratory disorder [facility])    ED Discharge Orders    None       Diannia Ruder, MD Oct 14, 2017 4098    Gerhard Munch, MD 10/07/2017 [phone]

## 2017-10-01 NOTE — ED Notes (Signed)
Pt removed from backboard

## 2017-10-01 NOTE — H&P (Addendum)
PULMONARY / CRITICAL CARE MEDICINE   Name: Leslie Porter MRN: [id number] DOB: [birth year]    ADMISSION DATE:  10/10/2017 CONSULTATION DATE:  10/05/2017  REFERRING MD:  Dr. Pricilla Holm   CHIEF COMPLAINT:  Cardiac Arrest   HISTORY OF PRESENT ILLNESS:   72 year old female with PMH of COPD on 2.5 L [place] at home, Diastolic HF (G1DD, EF 65-70)   Recent admission on 3/1-3/2 for COPD Exacerbation   Presents to ED on 6/09 s/p Cardiac Arrest. Reportedly patient called EMS stating she was short of breath. When EMS arrived patient was unresponsive and asystole. CPR for 15 minutes before ROSC. Intubated. Given Solu-Medrol. ABG 7.4/81/569. BP 118/91. Patient non-responsive. Code [employer]. PCCM asked to admit.   PAST MEDICAL HISTORY :  She  has a past medical history of Asthma, COPD (chronic obstructive pulmonary disease) (HCC), Diastolic dysfunction, and Osteoporosis.  PAST SURGICAL HISTORY: She  has a past surgical history that includes Vaginal hysterectomy.  Allergies  Allergen Reactions  . Levofloxacin Shortness Of Breath    No current facility-administered medications on file prior to encounter.    Current Outpatient Medications on File Prior to Encounter  Medication Sig  . alendronate (FOSAMAX) 70 MG tablet Take 70 mg by mouth every Sunday.   . budesonide-formoterol (SYMBICORT) 160-4.5 MCG/ACT inhaler Inhale 2 puffs into the lungs 2 (two) times daily.  . Calcium Carbonate-Vit D-Min (CALCIUM 1200 PO) Take 1 tablet by mouth daily.  Marland Kitchen guaiFENesin (MUCINEX) 600 MG 12 hr tablet Take 1 tablet (600 mg total) by mouth 2 (two) times daily as needed for cough or to loosen phlegm.  Marland Kitchen ipratropium-albuterol (DUONEB) 0.5-2.5 (3) MG/3ML SOLN Take 3 mLs by nebulization 2 (two) times daily.  . Multiple Vitamin (MULTIVITAMIN) capsule Take 1 capsule by mouth daily.  . predniSONE (DELTASONE) 10 MG tablet Take 5 tablets (50 mg total) by mouth daily with breakfast. And decrease by one tablet daily  . Tiotropium  Bromide Monohydrate 1.25 MCG/ACT AERS Inhale 2 sprays into the lungs daily.    FAMILY HISTORY:  Her indicated that the status of her mother is unknown. She indicated that the status of her father is unknown.   SOCIAL HISTORY: She  reports that she quit smoking about 21 months ago. Her smoking use included cigarettes. She quit after 55.00 years of use. She has never used smokeless tobacco. She reports that she drinks alcohol. She reports that she does not use drugs.  REVIEW OF SYSTEMS:   Unable to review as patient is intubated and non-responsive    SUBJECTIVE:   VITAL SIGNS: BP (!) 106/50   Pulse (!) 29   Temp (!) 94.6 F (34.8 C)   Resp (!) 29   Ht 5\' 2"  (1.575 m)   SpO2 100%   BMI 21.40 kg/m   HEMODYNAMICS:    VENTILATOR SETTINGS: Vent Mode: PRVC FiO2 (%):  [100 %] 100 % Set Rate:  [14 bmp] 14 bmp Vt Set:  [400 mL] 400 mL PEEP:  [5 cmH20] 5 cmH20 Plateau Pressure:  [27 cmH20] 27 cmH20  INTAKE / OUTPUT: No intake/output data recorded.  PHYSICAL EXAMINATION: General:  Elderly female, on vent  Neuro:  Pupils intact, withdrawals to pain, does not open eyes, +gag/cough HEENT:  ETT in place  Cardiovascular:  Tachy, no MRG  Lungs:  Exp Wheeze, no crackles  Abdomen:  Non-distended, active bowel sounds  Musculoskeletal:  -edema  Skin:  Rash noted from knee up  LABS:  BMET Recent Labs  Lab 09/30/2017  2221  NA 134*  K 5.7*  CL 101  CO2 19*  BUN 14  CREATININE 0.90  GLUCOSE 214*    Electrolytes Recent Labs  Lab 10/29/17 2221  CALCIUM 8.7*    CBC Recent Labs  Lab 2017-10-29 2221  WBC 14.5*  HGB 14.5  HCT 45.8  PLT 386    Coag's No results for input(s): APTT, INR in the last 168 hours.  Sepsis Markers No results for input(s): LATICACIDVEN, PROCALCITON, O2SATVEN in the last 168 hours.  ABG Recent Labs  Lab 10-29-17 2302  PHART 7.406  PCO2ART 81.4*  PO2ART 569.0*    Liver Enzymes Recent Labs  Lab 10/29/2017 2221  AST 138*  ALT 112*   ALKPHOS 78  BILITOT 1.0  ALBUMIN 3.3*    Cardiac Enzymes Recent Labs  Lab 2017-10-29 2221  TROPONINI <0.03    Glucose No results for input(s): GLUCAP in the last 168 hours.  Imaging Dg Chest Port 1 View  Result Date: October 29, 2017 CLINICAL DATA:  Endotracheal tube and OG tube placement. EXAM: PORTABLE CHEST 1 VIEW COMPARISON:  None. FINDINGS: Endotracheal tube tip 5.1 cm from the carina. Enteric tube tip and side-port below the diaphragm not included in the field of view, likely in the stomach. Multiple overlying monitoring devices. Possible tiny right apical pneumothorax seen only on one view. The lungs are hyperinflated with bronchial thickening. Slight increased perihilar opacities may be bronchitic or congestive. Normal heart size with aortic atherosclerosis. Normal mediastinal contours. No focal airspace disease. No large pleural effusion. Right anterior rib fractures, uncertain acuity. Fracture of eighth rib is minimally displaced, probable ninth and tenth ribs anteriorly. IMPRESSION: 1. Endotracheal tube tip 5.1 cm from the carina. Enteric tube in place with tip and side-port below the diaphragm likely in the stomach not included in the field of view. 2. Possible small right apical pneumothorax. Right anterior rib fractures of uncertain acuity, eighth rib fracture peers minimally displaced. 3. Hyperinflation and bronchial thickening. Increased perihilar opacities may be bronchitic or congestive. These results were called by telephone at the time of interpretation on 2017-10-29 at 11:22 pm to Dr. Jeraldine Loots , who verbally acknowledged these results. Electronically Signed   By: Rubye Oaks M.D.   On: Oct 29, 2017 23:22     STUDIES:  CXR 6/09 > 1. Endotracheal tube tip 5.1 cm from the carina. Enteric tube in place with tip and side-port below the diaphragm likely in the stomach not included in the field of view. 2. Possible small right apical pneumothorax. Right anterior rib fractures of  uncertain acuity, eighth rib fracture peers minimally displaced. 3. Hyperinflation and bronchial thickening. Increased perihilar opacities may be bronchitic or congestive. 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, pleural effusion  -ABG 7.406/81.4/569  H/O COPD on 2.5L [place]  P:   Vent Support Trend ABG/CXR > Will Monitor Pneumothorax, Repeat CXR 0200, most likely needs Chest Tube tonight given positive pressure   Pulmonary Hygiene  VAP Bundle  Scheduled Duoneb, Pulmicort Given Solu-Medrol 125 mg in ED, Continue 40 mg q6h  CTA pending  Asystolic 15 minute Cardiac Arrest -Troponin <0.03  H/O Diastolic HF (EF 65-70, G1DD)  P:  Cardiac Monitoring  Maintain MAP >75  Hypothermia Protocol 36%  Trend Troponin  BNP pending  ECHO pending   SUP  P:   NPO PPI     VTE  P:  Trend CBC  Heparin SQ    Leukocytosis  Hypothermia (on arrival 33.5 C)  -U/A negative  Diffuse Rash secondary to medication allergy ?? Noted after EPI  P:   Trend WBC and Fever Curve Trend PCT and LA  PAN Culture  RVP pending  Benadryl 25 mg q6h   Hyperglycemia    P:   Trend glucose SSI  Cortisol pending   Encephalopathy vs Anoxic Injury  -UDS negative  P:   RASS goal: 0/-1 Code Cool as above  Wean Fentanyl gtt to achieve RASS EEG pending  CT Head pending    FAMILY  - Updates: Sister called (lives out of town)   - Inter-disciplinary family meet or Palliative Care meeting due  by:  10/09/2017  CC Time: 92 minutes   Jovita KussmaulKatalina Eubanks, AGACNP-BC [facility]  Pgr: [phone]  PCCM Pgr: [phone]

## 2017-10-01 NOTE — ED Notes (Signed)
IO removed

## 2017-10-01 NOTE — ED Notes (Signed)
c-collar applied  

## 2017-10-01 NOTE — ED Notes (Signed)
[facility]

## 2017-10-01 NOTE — ED Triage Notes (Signed)
Pt called out to EMS for difficulty breathing went unresponsive while on the phone upon EMS arrival pt was uresponsive and in Asystole CPR for 15 minutes and epi x 1 pt regained pulses and was transported here

## 2017-10-02 ENCOUNTER — Inpatient Hospital Stay (HOSPITAL_COMMUNITY): Payer: Medicare Other

## 2017-10-02 ENCOUNTER — Encounter (HOSPITAL_COMMUNITY): Payer: Self-pay | Admitting: Pulmonary Disease

## 2017-10-02 DIAGNOSIS — G931 Anoxic brain damage, not elsewhere classified: Secondary | ICD-10-CM

## 2017-10-02 DIAGNOSIS — S2239XA Fracture of one rib, unspecified side, initial encounter for closed fracture: Secondary | ICD-10-CM | POA: Diagnosis present

## 2017-10-02 DIAGNOSIS — G253 Myoclonus: Secondary | ICD-10-CM | POA: Diagnosis not present

## 2017-10-02 DIAGNOSIS — I469 Cardiac arrest, cause unspecified: Secondary | ICD-10-CM

## 2017-10-02 DIAGNOSIS — J939 Pneumothorax, unspecified: Secondary | ICD-10-CM | POA: Diagnosis present

## 2017-10-02 DIAGNOSIS — J9601 Acute respiratory failure with hypoxia: Secondary | ICD-10-CM

## 2017-10-02 LAB — POCT I-STAT 3, ART BLOOD GAS (G3+)
Acid-base deficit: 5 mmol/L — ABNORMAL HIGH (ref 0.0–2.0)
Acid-base deficit: 4 mmol/L — ABNORMAL HIGH (ref 0.0–2.0)
Acid-base deficit: 1 mmol/L (ref 0.0–2.0)
BICARBONATE: 23.8 mmol/L (ref 20.0–28.0)
Bicarbonate: 25.7 mmol/L (ref 20.0–28.0)
Bicarbonate: 26.7 mmol/L (ref 20.0–28.0)
O2 SAT: 98 %
O2 SAT: 97 %
O2 SAT: 98 %
PCO2 ART: 62.9 mmHg — AB (ref 32.0–48.0)
PCO2 ART: 54.9 mmHg — AB (ref 32.0–48.0)
PO2 ART: 112 mmHg — AB (ref 83.0–108.0)
PO2 ART: 114 mmHg — AB (ref 83.0–108.0)
Patient temperature: 35.9
Patient temperature: 36
TCO2: 26 mmol/L (ref 22–32)
TCO2: 28 mmol/L (ref 22–32)
TCO2: 28 mmol/L (ref 22–32)
pCO2 arterial: 60.5 mmHg — ABNORMAL HIGH (ref 32.0–48.0)
pH, Arterial: 7.199 — CL (ref 7.350–7.450)
pH, Arterial: 7.213 — ABNORMAL LOW (ref 7.350–7.450)
pH, Arterial: 7.289 — ABNORMAL LOW (ref 7.350–7.450)
pO2, Arterial: 123 mmHg — ABNORMAL HIGH (ref 83.0–108.0)

## 2017-10-02 LAB — URINALYSIS, ROUTINE W REFLEX MICROSCOPIC
Bilirubin Urine: NEGATIVE
Glucose, UA: 50 mg/dL — AB
Hgb urine dipstick: NEGATIVE
KETONES UR: NEGATIVE mg/dL
Leukocytes, UA: NEGATIVE
Nitrite: NEGATIVE
Protein, ur: 100 mg/dL — AB
SPECIFIC GRAVITY, URINE: 1.012 (ref 1.005–1.030)
pH: 5 (ref 5.0–8.0)

## 2017-10-02 LAB — TROPONIN I
TROPONIN I: 0.04 ng/mL — AB (ref <0.03)
TROPONIN I: 0.05 ng/mL — AB (ref <0.03)
TROPONIN I: 0.06 ng/mL — AB (ref <0.03)

## 2017-10-02 LAB — TRIGLYCERIDES: TRIGLYCERIDES: 34 mg/dL (ref <150)

## 2017-10-02 LAB — ETHANOL: Alcohol, Ethyl (B): <10 mg/dL (ref <10)

## 2017-10-02 LAB — RAPID URINE DRUG SCREEN, HOSP PERFORMED
Amphetamines: NOT DETECTED
Barbiturates: NOT DETECTED
Benzodiazepines: NOT DETECTED
Cocaine: NOT DETECTED
Opiates: NOT DETECTED
Tetrahydrocannabinol: NOT DETECTED

## 2017-10-02 LAB — RESPIRATORY PANEL BY PCR
ADENOVIRUS-RVPPCR: NOT DETECTED
Bordetella pertussis: NOT DETECTED
CHLAMYDOPHILA PNEUMONIAE-RVPPCR: NOT DETECTED
CORONAVIRUS 229E-RVPPCR: NOT DETECTED
CORONAVIRUS HKU1-RVPPCR: NOT DETECTED
CORONAVIRUS NL63-RVPPCR: NOT DETECTED
Coronavirus OC43: NOT DETECTED
Influenza A: NOT DETECTED
Influenza B: NOT DETECTED
MYCOPLASMA PNEUMONIAE-RVPPCR: NOT DETECTED
Metapneumovirus: NOT DETECTED
PARAINFLUENZA VIRUS 3-RVPPCR: NOT DETECTED
Parainfluenza Virus 1: NOT DETECTED
Parainfluenza Virus 2: NOT DETECTED
Parainfluenza Virus 4: NOT DETECTED
RHINOVIRUS / ENTEROVIRUS - RVPPCR: NOT DETECTED
Respiratory Syncytial Virus: NOT DETECTED

## 2017-10-02 LAB — BASIC METABOLIC PANEL
Anion gap: 11 (ref 5–15)
Anion gap: 13 (ref 5–15)
BUN: 16 mg/dL (ref 6–20)
BUN: 18 mg/dL (ref 6–20)
CALCIUM: 7.8 mg/dL — AB (ref 8.9–10.3)
CO2: 20 mmol/L — AB (ref 22–32)
CO2: 20 mmol/L — AB (ref 22–32)
CREATININE: 0.74 mg/dL (ref 0.44–1.00)
CREATININE: 0.81 mg/dL (ref 0.44–1.00)
Calcium: 7.6 mg/dL — ABNORMAL LOW (ref 8.9–10.3)
Chloride: 108 mmol/L (ref 101–111)
Chloride: 108 mmol/L (ref 101–111)
GFR calc Af Amer: >60 mL/min (ref >60)
GFR calc Af Amer: >60 mL/min (ref >60)
GFR calc non Af Amer: >60 mL/min (ref >60)
GFR calc non Af Amer: >60 mL/min (ref >60)
GLUCOSE: 122 mg/dL — AB (ref 65–99)
GLUCOSE: 188 mg/dL — AB (ref 65–99)
Potassium: 4.5 mmol/L (ref 3.5–5.1)
Potassium: 4.4 mmol/L (ref 3.5–5.1)
Sodium: 139 mmol/L (ref 135–145)
Sodium: 141 mmol/L (ref 135–145)

## 2017-10-02 LAB — POCT I-STAT 3, VENOUS BLOOD GAS (G3P V)
Acid-base deficit: 5 mmol/L — ABNORMAL HIGH (ref 0.0–2.0)
BICARBONATE: 23.8 mmol/L (ref 20.0–28.0)
O2 SAT: 96 %
PCO2 VEN: 58.5 mmHg (ref 44.0–60.0)
TCO2: 26 mmol/L (ref 22–32)
pH, Ven: 7.214 — ABNORMAL LOW (ref 7.250–7.430)
pO2, Ven: 95 mmHg — ABNORMAL HIGH (ref 32.0–45.0)

## 2017-10-02 LAB — HEMOGLOBIN A1C
Hgb A1c MFr Bld: 5.5 % (ref 4.8–5.6)
MEAN PLASMA GLUCOSE: 111.15 mg/dL

## 2017-10-02 LAB — GLUCOSE, CAPILLARY
GLUCOSE-CAPILLARY: 124 mg/dL — AB (ref 65–99)
Glucose-Capillary: 197 mg/dL — ABNORMAL HIGH (ref 65–99)
Glucose-Capillary: 153 mg/dL — ABNORMAL HIGH (ref 65–99)

## 2017-10-02 LAB — PROTIME-INR
INR: 0.97
INR: 1.09
PROTHROMBIN TIME: 12.8 s (ref 11.4–15.2)
Prothrombin Time: 14 seconds (ref 11.4–15.2)

## 2017-10-02 LAB — BRAIN NATRIURETIC PEPTIDE: B Natriuretic Peptide: 26.3 pg/mL (ref 0.0–100.0)

## 2017-10-02 LAB — STREP PNEUMONIAE URINARY ANTIGEN: STREP PNEUMO URINARY ANTIGEN: NEGATIVE

## 2017-10-02 LAB — LACTIC ACID, PLASMA: LACTIC ACID, VENOUS: 1.2 mmol/L (ref 0.5–1.9)

## 2017-10-02 LAB — MRSA PCR SCREENING: MRSA by PCR: NEGATIVE

## 2017-10-02 LAB — APTT
APTT: 27 s (ref 24–36)
aPTT: 33 seconds (ref 24–36)

## 2017-10-02 LAB — PROCALCITONIN: PROCALCITONIN: 0.56 ng/mL

## 2017-10-02 LAB — ECHOCARDIOGRAM COMPLETE
Height: 62 in
Weight: 1964.74 oz

## 2017-10-02 LAB — CORTISOL: Cortisol, Plasma: 28.3 ug/dL

## 2017-10-02 MED ORDER — SODIUM CHLORIDE 0.9 % IV SOLN
5.0000 mg/h | INTRAVENOUS | Status: DC
  Filled 2017-10-02: qty 10

## 2017-10-02 MED ORDER — VALPROATE SODIUM 500 MG/5ML IV SOLN
1100.0000 mg | Freq: Once | INTRAVENOUS | Status: AC
  Administered 2017-10-02: 1100 mg via INTRAVENOUS
  Filled 2017-10-02: qty 11

## 2017-10-02 MED ORDER — ORAL CARE MOUTH RINSE
15.0000 mL | OROMUCOSAL | Status: DC
  Administered 2017-10-02 (×5): 15 mL via OROMUCOSAL

## 2017-10-02 MED ORDER — SODIUM CHLORIDE 0.9 % IV SOLN
0.0000 mg/h | INTRAVENOUS | Status: DC
  Filled 2017-10-02: qty 10

## 2017-10-02 MED ORDER — MORPHINE BOLUS VIA INFUSION
5.0000 mg | Freq: Once | INTRAVENOUS | Status: AC
  Administered 2017-10-02: 5 mg via INTRAVENOUS
  Filled 2017-10-02: qty 5

## 2017-10-02 MED ORDER — IPRATROPIUM-ALBUTEROL 0.5-2.5 (3) MG/3ML IN SOLN: 3.0000 mL | Freq: Four times a day (QID) | RESPIRATORY_TRACT | Status: DC | PRN

## 2017-10-02 MED ORDER — PROPOFOL 1000 MG/100ML IV EMUL
5.0000 ug/kg/min | INTRAVENOUS | Status: DC
  Administered 2017-10-02: 5 ug/kg/min via INTRAVENOUS
  Administered 2017-10-02: 50 ug/kg/min via INTRAVENOUS
  Filled 2017-10-02 (×2): qty 100

## 2017-10-02 MED ORDER — MIDAZOLAM BOLUS VIA INFUSION
1.0000 mg | INTRAVENOUS | Status: DC | PRN
  Filled 2017-10-02: qty 2

## 2017-10-02 MED ORDER — CHLORHEXIDINE GLUCONATE 0.12% ORAL RINSE (MEDLINE KIT)
15.0000 mL | Freq: Two times a day (BID) | OROMUCOSAL | Status: DC
  Administered 2017-10-02 (×2): 15 mL via OROMUCOSAL

## 2017-10-02 MED ORDER — IOPAMIDOL (ISOVUE-370) INJECTION 76%
100.0000 mL | Freq: Once | INTRAVENOUS | Status: AC | PRN
  Administered 2017-10-02: 100 mL via INTRAVENOUS

## 2017-10-02 MED ORDER — VALPROATE SODIUM 500 MG/5ML IV SOLN
200.0000 mg | Freq: Four times a day (QID) | INTRAVENOUS | Status: DC
  Filled 2017-10-02 (×3): qty 2

## 2017-10-02 MED ORDER — MORPHINE 100MG IN NS 100ML (1MG/ML) PREMIX INFUSION
5.0000 mg/h | INTRAVENOUS | Status: DC
  Administered 2017-10-02: 5 mg/h via INTRAVENOUS
  Filled 2017-10-02: qty 100

## 2017-10-04 LAB — CULTURE, RESPIRATORY W GRAM STAIN: Culture: NORMAL

## 2017-10-04 LAB — CULTURE, RESPIRATORY

## 2017-10-07 LAB — CULTURE, BLOOD (ROUTINE X 2)
CULTURE: NO GROWTH
Culture: NO GROWTH
SPECIAL REQUESTS: ADEQUATE
Special Requests: ADEQUATE

## 2017-10-09 ENCOUNTER — Telehealth: Payer: Self-pay

## 2017-10-09 NOTE — Telephone Encounter (Signed)
On 10/09/17 I [facility] a [facility] ([facility] The d/c is for removal from state.  The patient is a patient of Doctor Agarwala.  The d/c will be taken to Pulmonary Unit for signature.  On 10/10/17 I received the d/c back from Doctor Craige CottaSood who signed the d/c for Doctor Agarwala.  I got the d/c ready and called the funeral home to let them know the d/c was mailed to [facility] per the funeral home request.

## 2017-10-23 NOTE — Progress Notes (Signed)
Sputum culture collected, sent to lab.  

## 2017-10-23 NOTE — Progress Notes (Signed)
Pt pronounced dead at 1823. This nurse was at bedside. Family wasn't at bedside but notified by phone of pt's passing.

## 2017-10-23 NOTE — Procedures (Signed)
Extubation Procedure Note  Patient Details:   Name: Leslie Porter DOB: 04/26/1945 MRN: [id number]   Airway Documentation:    Vent end date: 10/18/2017 Vent end time: 1238   Evaluation  O2 sats: stable throughout Complications: No apparent complications Patient did tolerate procedure well. Bilateral Breath Sounds: Clear, Diminished   Yes   Patient was terminally extubated to room air with RN at the bedside.    Carlynn SpryCobb, Tyreece Gelles L 09/24/2017, 12:38 PM

## 2017-10-23 NOTE — Procedures (Signed)
ELECTROENCEPHALOGRAM REPORT  Date of Study: 05-02-2017  Patient's Name: Leslie Porter MRN: [id number] Date of Birth: Jul 10, 1945  Referring Provider: Jovita KussmaulKatalina Eubanks, NP  Clinical History: This is a 72 year old woman s/p cardiac arrest.  Medications: Propofol Fentanyl  Technical Summary: A multichannel digital EEG recording measured by the international 10-20 system with electrodes applied with paste and impedances below 5000 ohms performed in our laboratory with EKG monitoring in an intubated and sedated patient.  Hyperventilation and photic stimulation were not performed.  The digital EEG was referentially recorded, reformatted, and digitally filtered in a variety of bipolar and referential montages for optimal display.    Description: The patient is intubated and sedated on Propofol and Fentanyl during the recording. There is loss of normal background activity. A burst-suppression pattern is seen with bursts of irregular sharp transients consistent with muscle/movement artifact associated with myoclonic jerks, with an interburst interval of 2-5 seconds. There is no spontaneous reactivity or reactivity noted with noxious stimulation. Hyperventilation and photic stimulation were not performed. There were no epileptiform discharges or electrographic seizures seen.   EKG lead was unremarkable.  Impression: This EEG is markedly abnormal due to burst-suppression pattern with myoclonic jerks occurring with bursts of muscle artifact.  Clinical Correlation: This record shows evidence of severe diffuse or bilateral cerebral dysfunction consistent with patient's history of anoxic brain injury. Clinical correlation is advised.   Patrcia DollyKaren Rihan Schueler, M.D.

## 2017-10-23 NOTE — Progress Notes (Signed)
While in the ED Chaplain notified of a CPR in progress. The patient was unresponsive upon arrival to the ED. It was reported by EMS staff that the patient had phoned 911 and request assistance, she was alert on their arrival , but prior to their arrival to the hospital the patient became unresponsive, CPR was conducted The ED staff provided medical intervention and the patient, the patient was put on the vent at this time. Chaplain contacted the sister and informed her of her sister"s presence in the ED. Chaplain will follow up at and later time for the sister's arrival, and patient's medical status. Chaplain Janell QuietAudrey Tinlee Navarrette [phone]

## 2017-10-23 NOTE — Death Summary Note (Signed)
DEATH SUMMARY   Patient Details  Name: Leslie Porter MRN: [id number] DOB: 06/11/1945  Admission/Discharge Information   Admit Date:  10/08/2017  Date of Death: Date of Death: 10/04/2017  Time of Death: Time of Death: 1823  Length of Stay: 1  Referring Physician: Eartha InchBadger, Michael C, MD   Reason(s) for Hospitalization  Asystolic cardiac arrest.  Diagnoses  Preliminary cause of death:  Secondary Diagnoses (including complications and co-morbidities):  Principal Problem:   Acute on chronic respiratory failure with hypoxia and hypercapnia (HCC) Active Problems:   COPD mixed type (HCC)   Cardiac arrest (HCC)   Anoxic encephalopathy (HCC)   Myoclonic jerking   Rib fracture   Pneumothorax on right   Brief Hospital Course (including significant findings, care, treatment, and services provided and events leading to death)  Leslie Bushyatricia Oshea is a 72 y.o. year old female who was found unresponsive at home in asystole.  Had history of oxygen dependent COPD and HF and called ambulance for dyspnea. No recovery of neurological function following ROSC and patient developed significant myoclonus.  The patient's family indicated that she would not want risk living dependent on others and decided to transition to comfort care.    Pertinent Labs and Studies  Significant Diagnostic Studies Ct Head Wo Contrast  Result Date: 10/20/2017 CLINICAL DATA:  Cardiac arrest EXAM: CT HEAD WITHOUT CONTRAST CT CERVICAL SPINE WITHOUT CONTRAST TECHNIQUE: Multidetector CT imaging of the head and cervical spine was performed following the standard protocol without intravenous contrast. Multiplanar CT image reconstructions of the cervical spine were also generated. COMPARISON:  None. FINDINGS: CT HEAD FINDINGS Brain: Mild atrophy with small vessel ischemic disease. Likely remote right putaminal lacunar infarct. Midline fourth ventricle and basal cisterns. No hydrocephalus. No intra-axial mass nor extra-axial fluid  collections. Vascular: No hyperdense vessel sign. Skull: Intact. Sinuses/Orbits: Bilateral cataract extractions. Cle minimal ethmoid sinus mucosal thickening. Clear mastoids. Dextroconvex bowing of the nasal septum. Other: Endotracheal tube partially included. CT CERVICAL SPINE FINDINGS Alignment: Maintained cervical lordosis. Skull base and vertebrae: Intact skull base. Osteoarthritis of the Lantus dental interval with joint space narrowing and spurring. No acute vertebral body fracture or listhesis. Soft tissues and spinal canal: Endotracheal and gastric tubes are in place. No significant pre vertebral soft tissue swelling. No visible canal hematoma. Disc levels: Moderate degenerative disc space narrowing at C5-6 with spurring of the endplates. Uncovertebral joint osteoarthritis at C4-5, C5-6 and C6-7 on the right. There is associated mild foraminal stenosis at C6-7 due to uncinate spurring. Upper chest: Apical pleuroparenchymal scarring and thickening, right greater than left. Other: None IMPRESSION: 1. Remote appearing right putaminal lacunar infarct. Chronic appearing small vessel ischemic disease. 2. No acute intracranial abnormality. 3. Cervical spondylosis without acute cervical spine fracture or listhesis. Electronically Signed   By: Tollie Ethavid  Kwon M.D.   On: 10/20/2017 00:49   Ct Angio Chest Pe W/cm &/or Wo Cm  Result Date: 09/29/2017 CLINICAL DATA:  PE suspected, high pretest prob.  Cardiac arrest. EXAM: CT ANGIOGRAPHY CHEST WITH CONTRAST TECHNIQUE: Multidetector CT imaging of the chest was performed using the standard protocol during bolus administration of intravenous contrast. Multiplanar CT image reconstructions and MIPs were obtained to evaluate the vascular anatomy. CONTRAST:  100mL ISOVUE-370 IOPAMIDOL (ISOVUE-370) INJECTION 76% COMPARISON:  Radiographs earlier this day. FINDINGS: Cardiovascular: There are no filling defects within the pulmonary arteries to suggest pulmonary embolus. Moderate  aortic atherosclerosis without aneurysm or dissection. Heart is normal in size. There are coronary artery calcifications versus stents. Left central line  in place, tip at the confluence of the brachiocephalic and left subclavian vein. Mediastinum/Nodes: Small volume pneumomediastinum about the lower heart and IVC. Minimal pneumomediastinum versus medial right pneumothorax anteriorly. Small amount of mediastinal hematoma anteriorly subjacent to rib and sternal fractures. Endotracheal tube tip above the carina with minimal adherent mucus. Enteric tube with tip below the diaphragm likely in the stomach, no definite esophageal thickening. Lungs/Pleura: Moderate emphysema. Severe central bronchial thickening with debris or mucus in the right mainstem bronchus. Possible tiny medial right pneumothorax versus pneumomediastinum anteriorly, image 81 series 7. Breathing motion artifact obscures lung base evaluation. Biapical pleuroparenchymal scarring. Upper Abdomen: No acute findings. Musculoskeletal: Fractures of anterior right 2 through 8, with fifth and sixth rib fracture is minimally displaced. Fractures of anterior left 2 through 8 ribs, nondisplaced. Lower ribs are obscured by motion. Nondisplaced mid sternal body fracture. Compression fractures of T6 and T7 vertebral bodies are likely chronic but age indeterminate. Hemangioma within T9 vertebral body. Review of the MIP images confirms the above findings. IMPRESSION: 1. No pulmonary embolus. 2. Small volume of pneumomediastinum and mediastinal hemorrhage, likely secondary to anterior rib and sternal fractures. Tiny medial right pneumothorax versus pneumomediastinum anteriorly. 3. Acute fractures of right and left anterior second through eighth ribs, right ribs 5 and 6 fractures are minimally displaced. Acute nondisplaced sternal body fracture. 4. Moderate emphysema. Emphysema (ICD10-J43.9). Severe central bronchial thickening with areas of mucous plugging. Mucous is  adherent to the distal endotracheal tube and within the right mainstem bronchus. 5. Aortic Atherosclerosis (ICD10-I70.0). Coronary artery calcifications versus stents. Results called to Fleet Contras, was caring for the patient at 1:25 a.m. on 12-Oct-2017 Electronically Signed   By: Rubye Oaks M.D.   On: 2017/10/12 01:26   Ct Cervical Spine Wo Contrast  Result Date: 10-12-17 CLINICAL DATA:  Cardiac arrest EXAM: CT HEAD WITHOUT CONTRAST CT CERVICAL SPINE WITHOUT CONTRAST TECHNIQUE: Multidetector CT imaging of the head and cervical spine was performed following the standard protocol without intravenous contrast. Multiplanar CT image reconstructions of the cervical spine were also generated. COMPARISON:  None. FINDINGS: CT HEAD FINDINGS Brain: Mild atrophy with small vessel ischemic disease. Likely remote right putaminal lacunar infarct. Midline fourth ventricle and basal cisterns. No hydrocephalus. No intra-axial mass nor extra-axial fluid collections. Vascular: No hyperdense vessel sign. Skull: Intact. Sinuses/Orbits: Bilateral cataract extractions. Cle minimal ethmoid sinus mucosal thickening. Clear mastoids. Dextroconvex bowing of the nasal septum. Other: Endotracheal tube partially included. CT CERVICAL SPINE FINDINGS Alignment: Maintained cervical lordosis. Skull base and vertebrae: Intact skull base. Osteoarthritis of the Lantus dental interval with joint space narrowing and spurring. No acute vertebral body fracture or listhesis. Soft tissues and spinal canal: Endotracheal and gastric tubes are in place. No significant pre vertebral soft tissue swelling. No visible canal hematoma. Disc levels: Moderate degenerative disc space narrowing at C5-6 with spurring of the endplates. Uncovertebral joint osteoarthritis at C4-5, C5-6 and C6-7 on the right. There is associated mild foraminal stenosis at C6-7 due to uncinate spurring. Upper chest: Apical pleuroparenchymal scarring and thickening, right greater than  left. Other: None IMPRESSION: 1. Remote appearing right putaminal lacunar infarct. Chronic appearing small vessel ischemic disease. 2. No acute intracranial abnormality. 3. Cervical spondylosis without acute cervical spine fracture or listhesis. Electronically Signed   By: Tollie Eth M.D.   On: 10-12-17 00:49   Dg Chest Port 1 View  Result Date: Oct 12, 2017 CLINICAL DATA:  Patient status post cardiac arrest. Admitted to the hospital 10/01/2017. Intubated. EXAM: PORTABLE CHEST 1 VIEW COMPARISON:  Single-view of the chest 0 6/0 01/2018 and 10/10/2017. CT chest 10/24/2017. FINDINGS: Support tubes and lines are unchanged. The lungs are emphysematous but clear. Heart size is normal. Tiny right pneumothorax seen on prior CT is not visible on this exam. No pneumothorax or pleural effusion is identified. Extensive atherosclerosis is noted. No bony abnormality. IMPRESSION: No change in support apparatus. Emphysema.  Lungs are clear. Electronically Signed   By: Drusilla Kanner M.D.   On: 10/24/2017 08:55   Dg Chest Port 1 View  Result Date: 2017/10/24 CLINICAL DATA:  Pneumothorax. EXAM: PORTABLE CHEST 1 VIEW COMPARISON:  CT earlier this day.  Radiographs yesterday. FINDINGS: Pneumomediastinum possible tiny right pneumothorax on CT are not well visualized radiographically. Endotracheal tube tip at the thoracic inlet. Enteric tube with tip below the diaphragm in the stomach. Left central line tip in the proximal SVC. Hyperinflation and emphysema. There is central bronchial thickening. No new airspace disease. No pleural effusion. Rib fractures on prior CT and radiographs not as well demonstrated on the current exam. IMPRESSION: 1. Pneumomediastinum possible tiny right pneumothorax on prior CT are not well demonstrated radiographically. 2. Emphysema and bronchial thickening. 3. Support apparatus unchanged. Electronically Signed   By: Rubye Oaks M.D.   On: 2017/10/24 02:19   Dg Chest Portable 1 View  Result  Date: 10/11/2017 CLINICAL DATA:  Central line placement EXAM: PORTABLE CHEST 1 VIEW COMPARISON:  Portable exam 2327 hours compared to 2240 hours FINDINGS: Endotracheal and nasogastric tubes unchanged. New LEFT jugular central venous catheter with tip projecting over confluence of the LEFT brachiocephalic vein with SVC. External pacing leads present. Normal heart size, mediastinal contours, and pulmonary vascularity. Atherosclerotic calcifications aorta. Emphysematous changes without infiltrate, pleural effusion or pneumothorax. Bones demineralized. IMPRESSION: New LEFT jugular catheter without pneumothorax. Emphysematous changes without infiltrate. Stable tube positions. Electronically Signed   By: Ulyses Southward M.D.   On: 09/23/2017 23:55   Dg Chest Port 1 View  Result Date: 10/16/2017 CLINICAL DATA:  Endotracheal tube and OG tube placement. EXAM: PORTABLE CHEST 1 VIEW COMPARISON:  None. FINDINGS: Endotracheal tube tip 5.1 cm from the carina. Enteric tube tip and side-port below the diaphragm not included in the field of view, likely in the stomach. Multiple overlying monitoring devices. Possible tiny right apical pneumothorax seen only on one view. The lungs are hyperinflated with bronchial thickening. Slight increased perihilar opacities may be bronchitic or congestive. Normal heart size with aortic atherosclerosis. Normal mediastinal contours. No focal airspace disease. No large pleural effusion. Right anterior rib fractures, uncertain acuity. Fracture of eighth rib is minimally displaced, probable ninth and tenth ribs anteriorly. IMPRESSION: 1. Endotracheal tube tip 5.1 cm from the carina. Enteric tube in place with tip and side-port below the diaphragm likely in the stomach not included in the field of view. 2. Possible small right apical pneumothorax. Right anterior rib fractures of uncertain acuity, eighth rib fracture peers minimally displaced. 3. Hyperinflation and bronchial thickening. Increased perihilar  opacities may be bronchitic or congestive. These results were called by telephone at the time of interpretation on 10/04/2017 at 11:22 pm to Dr. Jeraldine Loots , who verbally acknowledged these results. Electronically Signed   By: Rubye Oaks M.D.   On: 09/30/2017 23:22    Microbiology Recent Results (from the past 240 hour(s))  Blood culture (routine x 2)     Status: None (Preliminary result)   Collection Time: 10/09/2017 11:38 PM  Result Value Ref Range Status   Specimen Description BLOOD RIGHT ARM  Final  Special Requests   Final    BOTTLES DRAWN AEROBIC AND ANAEROBIC Blood Culture adequate volume   Culture   Final    NO GROWTH 1 DAY Performed at [facility], [address] [place], [place] [address]    Report Status PENDING  Incomplete  Blood culture (routine x 2)     Status: None (Preliminary result)   Collection Time: 10-16-2017 11:46 PM  Result Value Ref Range Status   Specimen Description BLOOD CENTRAL LINE  Final   Special Requests   Final    BOTTLES DRAWN AEROBIC AND ANAEROBIC Blood Culture adequate volume   Culture   Final    NO GROWTH 1 DAY Performed at [facility], [address] [place], [place] [address]    Report Status PENDING  Incomplete  Respiratory Panel by PCR     Status: None   Collection Time: 10/03/2017  1:17 AM  Result Value Ref Range Status   Adenovirus NOT DETECTED NOT DETECTED Final   Coronavirus 229E NOT DETECTED NOT DETECTED Final   Coronavirus HKU1 NOT DETECTED NOT DETECTED Final   Coronavirus NL63 NOT DETECTED NOT DETECTED Final   Coronavirus OC43 NOT DETECTED NOT DETECTED Final   Metapneumovirus NOT DETECTED NOT DETECTED Final   Rhinovirus / Enterovirus NOT DETECTED NOT DETECTED Final   Influenza A NOT DETECTED NOT DETECTED Final   Influenza B NOT DETECTED NOT DETECTED Final   Parainfluenza Virus 1 NOT DETECTED NOT DETECTED Final   Parainfluenza Virus 2 NOT DETECTED NOT DETECTED Final   Parainfluenza Virus 3 NOT DETECTED NOT DETECTED  Final   Parainfluenza Virus 4 NOT DETECTED NOT DETECTED Final   Respiratory Syncytial Virus NOT DETECTED NOT DETECTED Final   Bordetella pertussis NOT DETECTED NOT DETECTED Final   Chlamydophila pneumoniae NOT DETECTED NOT DETECTED Final   Mycoplasma pneumoniae NOT DETECTED NOT DETECTED Final    Comment: Performed at [facility], [address] [place], [place] [address]  MRSA PCR Screening     Status: None   Collection Time: 10/21/2017  1:17 AM  Result Value Ref Range Status   MRSA by PCR NEGATIVE NEGATIVE Final    Comment:        The GeneXpert MRSA Assay (FDA approved for NASAL specimens only), is one component of a comprehensive MRSA colonization surveillance program. It is not intended to diagnose MRSA infection nor to guide or monitor treatment for MRSA infections. Performed at [facility], [address] [place], [place] [address]   Culture, respiratory (NON-Expectorated)     Status: None (Preliminary result)   Collection Time: 10/18/2017  1:22 AM  Result Value Ref Range Status   Specimen Description TRACHEAL ASPIRATE  Final   Special Requests NONE  Final   Gram Stain   Final    FEW WBC PRESENT, PREDOMINANTLY PMN RARE GRAM POSITIVE COCCI IN CLUSTERS IN PAIRS RARE YEAST    Culture   Final    CULTURE REINCUBATED FOR BETTER GROWTH Performed at [facility], [address], [place], [place] [address]    Report Status PENDING  Incomplete    Lab Basic Metabolic Panel: Recent Labs  Lab 10/16/17 2221 2017-10-16 2346 09/27/2017 1238  NA 134* 139 141  K 5.7* 4.5 4.4  CL 101 108 108  CO2 19* 20* 20*  GLUCOSE 214* 188* 122*  BUN 14 16 18   CREATININE 0.90 0.74 0.81  CALCIUM 8.7* 7.8* 7.6*   Liver Function Tests: Recent Labs  Lab 09/28/2017 2221  AST 138*  ALT 112*  ALKPHOS 78  BILITOT 1.0  PROT 5.8*  ALBUMIN 3.3*   No results for input(s): LIPASE, AMYLASE in the last 168 hours. No results for input(s): AMMONIA in the last 168  hours. CBC: Recent Labs  Lab 10/03/2017 2221  WBC 14.5*  NEUTROABS 8.0*  HGB 14.5  HCT 45.8  MCV 100.7*  PLT 386   Cardiac Enzymes: Recent Labs  Lab 09/29/2017 2221 10/13/2017 2346 2017/10/06 0454 10-06-17 1238  TROPONINI <0.03 0.04* 0.06* 0.05*   Sepsis Labs: Recent Labs  Lab 10/06/2017 2221 10/01/17 2346 10/06/17 0454  PROCALCITON  --  <0.10 0.56  WBC 14.5*  --   --   LATICACIDVEN  --  1.2  --     Procedures/Operations  Mechanical ventilation.   Delicia Berens 10/03/2017, 5:05 PM

## 2017-10-23 NOTE — Progress Notes (Signed)
eLink Physician-Brief Progress Note Patient Name: Leslie Porter DOB: 06/02/1945 MRN: [id number]   Date of Service  09/29/2017  HPI/Events of Note  ABG on 40%/PRVC 14/TV 400/P 5 = 7.199/60.5/123/23.  eICU Interventions  Will order: 1. Increase PRVC rate to 21. 2. Repeat ABG at 6 AM.     Intervention Category Major Interventions: Acid-Base disturbance - evaluation and management;Respiratory failure - evaluation and management  Jamari Diana Dennard Nipugene 10/22/2017, 3:53 AM

## 2017-10-23 NOTE — Progress Notes (Signed)
100 mL of Fentanyl and 60 mL of Morphine wasted with Deretha EmoryMildred Shaw, RN

## 2017-10-23 NOTE — Progress Notes (Signed)
Pt transported to 2H  14 without event.  RT will monitor.

## 2017-10-23 NOTE — Procedures (Signed)
Central Venous Catheter Insertion Procedure Note Grace Bushyatricia Beneke [id number] 07/29/1945  Procedure: Insertion of Central Venous Catheter Indications: Assessment of intravascular volume, Drug and/or fluid administration and Frequent blood sampling  Procedure Details Consent: Unable to obtain consent because of emergent medical necessity. Time Out: Verified patient identification, verified procedure, site/side was marked, verified correct patient position, special equipment/implants available, medications/allergies/relevent history reviewed, required imaging and test results available.  Performed  Maximum sterile technique was used including antiseptics, cap, gloves, gown, hand hygiene, mask and sheet. Skin prep: Chlorhexidine; local anesthetic administered A antimicrobial bonded/coated triple lumen catheter was placed in the left internal jugular vein using the Seldinger technique.  Evaluation Blood flow good Complications: No apparent complications Patient did tolerate procedure well. Chest X-ray ordered to verify placement.  CXR: pending.  Jovita KussmaulKatalina Sharalyn Lomba, AGACNP-BC [facility]  Pgr: [phone]  PCCM Pgr: [phone]

## 2017-10-23 NOTE — Progress Notes (Signed)
EEG complete - results pending 

## 2017-10-23 NOTE — Progress Notes (Signed)
Initial Nutrition Assessment  DOCUMENTATION CODES:   Not applicable  INTERVENTION:  RD to monitor goals of care  NUTRITION DIAGNOSIS:   Inadequate oral intake related to inability to eat as evidenced by NPO status.  GOAL:   Patient will meet greater than or equal to 90% of their needs  MONITOR:   Labs, Weight trends, I & O's, Vent status, Skin  REASON FOR ASSESSMENT:   Ventilator    ASSESSMENT:   72 y.o. F admitted on 09/30/2017 for acute on chronic respiratory failure with hypoxia and hypercapnia and cardiac arrest. Pt with recent admission for COPD exacerbation. PMH COPD on 2.5 L home O2, diastolic HF, asthma, and osteoporosis. Pt former smoker (quit 2017).   Spoke with RN and NP no plans to start TF today; per NP multifactorial, plans to discuss goals of care with family - may withdraw care. Pt currently on code cool.   Pt sleeping on vent with brother at bedside. Pt was jerking during dietetic intern visit. Brother reports pt lost weight, but couldn't specify how much or within what time frame. Pt brother did not know her usual intake PTA, but reports that she ate a lot of snacks in a day and that her intake was poor; brother could not specify meal completion.   Per chart review pt gained weight since last seen by RD, given brief note. Per previous RD note weight loss intentional after hospital visit 12/2015, pt eating well at this time.  Medications reviewed: heparin, novolog 0-15 units, solu-medrol, protonix, 0.9% NaCl @ 75 ml/hr, fentanyl, levophed, propofol.   Propofol @ 18.4  - plans to wean MVe: 9.2 Temp (24hrs), Avg:95 F (35 C), Min:92.3 F (33.5 C), Max:97.9 F (36.6 C)  Labs reviewed: troponin 0.06 (H) - no basic metabolic panel taken yet.   NUTRITION - FOCUSED PHYSICAL EXAM:  Suspect some of these depletions due to age-related sarcopenia.     Most Recent Value  Orbital Region  Unable to assess  Upper Arm Region  Moderate depletion  Thoracic and Lumbar  Region  Unable to assess  Buccal Region  Unable to assess  Temple Region  Moderate depletion  Clavicle Bone Region  No depletion  Clavicle and Acromion Bone Region  Mild depletion  Scapular Bone Region  Unable to assess  Dorsal Hand  No depletion  Patellar Region  Mild depletion  Anterior Thigh Region  Unable to assess  Posterior Calf Region  No depletion  Edema (RD Assessment)  None  Hair  Reviewed  Eyes  Unable to assess  Mouth  Unable to assess  Skin  Reviewed  Nails  Reviewed     Diet Order:   Diet Order    None      EDUCATION NEEDS:   Not appropriate for education at this time  Skin:  Skin Assessment: Skin Integrity Issues: Skin Integrity Issues:: Other (Comment) Other: ecchymosis  Last BM:  unknown  Height:   Ht Readings from Last 1 Encounters:  10/15/2017 5\' 2"  (1.575 m)    Weight:   Wt Readings from Last 1 Encounters:  10/05/2017 122 lb 12.7 oz (55.7 kg)   UBW:115-124 lbs %UBW: 100%  Ideal Body Weight:  50 kg  BMI:  Body mass index is 22.46 kg/m.  Estimated Nutritional Needs:   Kcal:  1165 kcal  Protein:  75-90 g  Fluid:  >/= 1.2 L or per MD    Sherrine MaplesMelissa Sydna Brodowski, Dietetic Intern

## 2017-10-23 NOTE — Progress Notes (Signed)
PULMONARY / CRITICAL CARE MEDICINE   Name: Leslie Porter MRN: [id number] DOB: 1946-02-19    ADMISSION DATE:  10/15/2017 CONSULTATION DATE:  10/03/2017  REFERRING MD:  Dr. Pricilla Holm   CHIEF COMPLAINT:  Cardiac Arrest   HISTORY OF PRESENT ILLNESS:   72 year old female with PMH of COPD on 2.5 L [place] at home, Diastolic HF (G1DD, EF 65-70)   Recent admission on 3/1-3/2 for COPD Exacerbation   Presents to ED on 6/09 s/p Cardiac Arrest. Reportedly patient called EMS stating she was short of breath. When EMS arrived patient was unresponsive and asystole. CPR for 15 minutes before ROSC. Intubated. Given Solu-Medrol. ABG 7.4/81/569. BP 118/91. Patient non-responsive. Code [employer]. PCCM asked to admit.   SUBJECTIVE:  Continues with myoclonic type jerking movements. ABG with resp acidosis. EEG finsished  VITAL SIGNS: BP 92/60   Pulse 93   Temp (!) 96.6 F (35.9 C) (Bladder)   Resp 16   Ht 5\' 2"  (1.575 m)   Wt 55.7 kg (122 lb 12.7 oz)   SpO2 98%   BMI 22.46 kg/m   HEMODYNAMICS: CVP:  [5 mmHg-18 mmHg] 18 mmHg  VENTILATOR SETTINGS: Vent Mode: PRVC FiO2 (%):  [40 %-100 %] 40 % Set Rate:  [14 bmp-26 bmp] 26 bmp Vt Set:  [400 mL] 400 mL PEEP:  [5 cmH20] 5 cmH20 Plateau Pressure:  [16 cmH20-27 cmH20] 16 cmH20  INTAKE / OUTPUT: I/O last 3 completed shifts: In: 993.5 [I.V.:993.5] Out: 430 [Urine:430]  PHYSICAL EXAMINATION:  General:  Elderly female, on vent  Neuro:  Pupils pinpoint, Unresponsive, Severe myoclonus.  HEENT:  ETT in place  Cardiovascular:  RRR, no MRG  Lungs: Wheeze improved/resolved, no crackles. Dyssynchrony due to myoclonus. Abdomen:  Non-distended, active bowel sounds  Musculoskeletal:  -edema, no acute deformity. Skin:  Rash noted from knee up  LABS:  BMET Recent Labs  Lab 09/30/2017 2221 10/03/2017 2346  NA 134* 139  K 5.7* 4.5  CL 101 108  CO2 19* 20*  BUN 14 16  CREATININE 0.90 0.74  GLUCOSE 214* 188*    Electrolytes Recent Labs  Lab  10/20/2017 2221 09/30/2017 2346  CALCIUM 8.7* 7.8*    CBC Recent Labs  Lab 09/27/2017 2221  WBC 14.5*  HGB 14.5  HCT 45.8  PLT 386    Coag's Recent Labs  Lab 10/17/2017 2346 2017/10/15 0826  APTT 33 27  INR 1.09 0.97    Sepsis Markers Recent Labs  Lab 09/23/2017 2346 15-Oct-2017 0454  LATICACIDVEN 1.2  --   PROCALCITON <0.10 0.56    ABG Recent Labs  Lab 10/20/2017 2302 10-15-2017 0343 10-15-2017 0835  PHART 7.406 7.199* 7.213*  PCO2ART 81.4* 60.5* 62.9*  PO2ART 569.0* 123.0* 112.0*    Liver Enzymes Recent Labs  Lab 10/20/2017 2221  AST 138*  ALT 112*  ALKPHOS 78  BILITOT 1.0  ALBUMIN 3.3*    Cardiac Enzymes Recent Labs  Lab 09/23/2017 2221 10/21/2017 2346 15-Oct-2017 0454  TROPONINI <0.03 0.04* 0.06*    Glucose Recent Labs  Lab 15-Oct-2017 0153 15-Oct-2017 0459 10/15/2017 0821  GLUCAP 197* 153* 124*    Imaging Ct Head Wo Contrast  Result Date: 15-Oct-2017 CLINICAL DATA:  Cardiac arrest EXAM: CT HEAD WITHOUT CONTRAST CT CERVICAL SPINE WITHOUT CONTRAST TECHNIQUE: Multidetector CT imaging of the head and cervical spine was performed following the standard protocol without intravenous contrast. Multiplanar CT image reconstructions of the cervical spine were also generated. COMPARISON:  None. FINDINGS: CT HEAD FINDINGS Brain: Mild atrophy with small  vessel ischemic disease. Likely remote right putaminal lacunar infarct. Midline fourth ventricle and basal cisterns. No hydrocephalus. No intra-axial mass nor extra-axial fluid collections. Vascular: No hyperdense vessel sign. Skull: Intact. Sinuses/Orbits: Bilateral cataract extractions. Cle minimal ethmoid sinus mucosal thickening. Clear mastoids. Dextroconvex bowing of the nasal septum. Other: Endotracheal tube partially included. CT CERVICAL SPINE FINDINGS Alignment: Maintained cervical lordosis. Skull base and vertebrae: Intact skull base. Osteoarthritis of the Lantus dental interval with joint space narrowing and spurring. No acute  vertebral body fracture or listhesis. Soft tissues and spinal canal: Endotracheal and gastric tubes are in place. No significant pre vertebral soft tissue swelling. No visible canal hematoma. Disc levels: Moderate degenerative disc space narrowing at C5-6 with spurring of the endplates. Uncovertebral joint osteoarthritis at C4-5, C5-6 and C6-7 on the right. There is associated mild foraminal stenosis at C6-7 due to uncinate spurring. Upper chest: Apical pleuroparenchymal scarring and thickening, right greater than left. Other: None IMPRESSION: 1. Remote appearing right putaminal lacunar infarct. Chronic appearing small vessel ischemic disease. 2. No acute intracranial abnormality. 3. Cervical spondylosis without acute cervical spine fracture or listhesis. Electronically Signed   By: Tollie Eth M.D.   On: 10/10/2017 00:49   Ct Angio Chest Pe W/cm &/or Wo Cm  Result Date: 10/17/2017 CLINICAL DATA:  PE suspected, high pretest prob.  Cardiac arrest. EXAM: CT ANGIOGRAPHY CHEST WITH CONTRAST TECHNIQUE: Multidetector CT imaging of the chest was performed using the standard protocol during bolus administration of intravenous contrast. Multiplanar CT image reconstructions and MIPs were obtained to evaluate the vascular anatomy. CONTRAST:  ISOVUE-370 IOPAMIDOL (ISOVUE-370) INJECTION 76% COMPARISON:  Radiographs earlier this day. FINDINGS: Cardiovascular: There are no filling defects within the pulmonary arteries to suggest pulmonary embolus. Moderate aortic atherosclerosis without aneurysm or dissection. Heart is normal in size. There are coronary artery calcifications versus stents. Left central line in place, tip at the confluence of the brachiocephalic and left subclavian vein. Mediastinum/Nodes: Small volume pneumomediastinum about the lower heart and IVC. Minimal pneumomediastinum versus medial right pneumothorax anteriorly. Small amount of mediastinal hematoma anteriorly subjacent to rib and sternal  fractures. Endotracheal tube tip above the carina with minimal adherent mucus. Enteric tube with tip below the diaphragm likely in the stomach, no definite esophageal thickening. Lungs/Pleura: Moderate emphysema. Severe central bronchial thickening with debris or mucus in the right mainstem bronchus. Possible tiny medial right pneumothorax versus pneumomediastinum anteriorly, image 81 series 7. Breathing motion artifact obscures lung base evaluation. Biapical pleuroparenchymal scarring. Upper Abdomen: No acute findings. Musculoskeletal: Fractures of anterior right 2 through 8, with fifth and sixth rib fracture is minimally displaced. Fractures of anterior left 2 through 8 ribs, nondisplaced. Lower ribs are obscured by motion. Nondisplaced mid sternal body fracture. Compression fractures of T6 and T7 vertebral bodies are likely chronic but age indeterminate. Hemangioma within T9 vertebral body. Review of the MIP images confirms the above findings. IMPRESSION: 1. No pulmonary embolus. 2. Small volume of pneumomediastinum and mediastinal hemorrhage, likely secondary to anterior rib and sternal fractures. Tiny medial right pneumothorax versus pneumomediastinum anteriorly. 3. Acute fractures of right and left anterior second through eighth ribs, right ribs 5 and 6 fractures are minimally displaced. Acute nondisplaced sternal body fracture. 4. Moderate emphysema. Emphysema (ICD10-J43.9). Severe central bronchial thickening with areas of mucous plugging. Mucous is adherent to the distal endotracheal tube and within the right mainstem bronchus. 5. Aortic Atherosclerosis (ICD10-I70.0). Coronary artery calcifications versus stents. Results called to [facility] Contras, was caring for the patient at 1:25 a.m. on 09/30/2017  Electronically Signed   By: Rubye Oaks M.D.   On: 10/05/2017 01:26   Ct Cervical Spine Wo Contrast  Result Date: 10/09/2017 CLINICAL DATA:  Cardiac arrest EXAM: CT HEAD WITHOUT CONTRAST CT CERVICAL SPINE  WITHOUT CONTRAST TECHNIQUE: Multidetector CT imaging of the head and cervical spine was performed following the standard protocol without intravenous contrast. Multiplanar CT image reconstructions of the cervical spine were also generated. COMPARISON:  None. FINDINGS: CT HEAD FINDINGS Brain: Mild atrophy with small vessel ischemic disease. Likely remote right putaminal lacunar infarct. Midline fourth ventricle and basal cisterns. No hydrocephalus. No intra-axial mass nor extra-axial fluid collections. Vascular: No hyperdense vessel sign. Skull: Intact. Sinuses/Orbits: Bilateral cataract extractions. Cle minimal ethmoid sinus mucosal thickening. Clear mastoids. Dextroconvex bowing of the nasal septum. Other: Endotracheal tube partially included. CT CERVICAL SPINE FINDINGS Alignment: Maintained cervical lordosis. Skull base and vertebrae: Intact skull base. Osteoarthritis of the Lantus dental interval with joint space narrowing and spurring. No acute vertebral body fracture or listhesis. Soft tissues and spinal canal: Endotracheal and gastric tubes are in place. No significant pre vertebral soft tissue swelling. No visible canal hematoma. Disc levels: Moderate degenerative disc space narrowing at C5-6 with spurring of the endplates. Uncovertebral joint osteoarthritis at C4-5, C5-6 and C6-7 on the right. There is associated mild foraminal stenosis at C6-7 due to uncinate spurring. Upper chest: Apical pleuroparenchymal scarring and thickening, right greater than left. Other: None IMPRESSION: 1. Remote appearing right putaminal lacunar infarct. Chronic appearing small vessel ischemic disease. 2. No acute intracranial abnormality. 3. Cervical spondylosis without acute cervical spine fracture or listhesis. Electronically Signed   By: Tollie Eth M.D.   On: 10/15/2017 00:49   Dg Chest Port 1 View  Result Date: 09/27/2017 CLINICAL DATA:  Patient status post cardiac arrest. Admitted to the hospital 10/29/17. Intubated.  EXAM: PORTABLE CHEST 1 VIEW COMPARISON:  Single-view of the chest 0 6/0 01/2018 and 10/29/2017. CT chest 10/08/2017. FINDINGS: Support tubes and lines are unchanged. The lungs are emphysematous but clear. Heart size is normal. Tiny right pneumothorax seen on prior CT is not visible on this exam. No pneumothorax or pleural effusion is identified. Extensive atherosclerosis is noted. No bony abnormality. IMPRESSION: No change in support apparatus. Emphysema.  Lungs are clear. Electronically Signed   By: Drusilla Kanner M.D.   On: 10/10/2017 08:55   Dg Chest Port 1 View  Result Date: 09/24/2017 CLINICAL DATA:  Pneumothorax. EXAM: PORTABLE CHEST 1 VIEW COMPARISON:  CT earlier this day.  Radiographs yesterday. FINDINGS: Pneumomediastinum possible tiny right pneumothorax on CT are not well visualized radiographically. Endotracheal tube tip at the thoracic inlet. Enteric tube with tip below the diaphragm in the stomach. Left central line tip in the proximal SVC. Hyperinflation and emphysema. There is central bronchial thickening. No new airspace disease. No pleural effusion. Rib fractures on prior CT and radiographs not as well demonstrated on the current exam. IMPRESSION: 1. Pneumomediastinum possible tiny right pneumothorax on prior CT are not well demonstrated radiographically. 2. Emphysema and bronchial thickening. 3. Support apparatus unchanged. Electronically Signed   By: Rubye Oaks M.D.   On: 10/17/2017 02:19   Dg Chest Portable 1 View  Result Date: 10/29/17 CLINICAL DATA:  Central line placement EXAM: PORTABLE CHEST 1 VIEW COMPARISON:  Portable exam 2327 hours compared to 2240 hours FINDINGS: Endotracheal and nasogastric tubes unchanged. New LEFT jugular central venous catheter with tip projecting over confluence of the LEFT brachiocephalic vein with SVC. External pacing leads present. Normal heart size, mediastinal  contours, and pulmonary vascularity. Atherosclerotic calcifications aorta.  Emphysematous changes without infiltrate, pleural effusion or pneumothorax. Bones demineralized. IMPRESSION: New LEFT jugular catheter without pneumothorax. Emphysematous changes without infiltrate. Stable tube positions. Electronically Signed   By: Ulyses Southward M.D.   On: 09/27/2017 23:55   Dg Chest Port 1 View  Result Date: 10/19/2017 CLINICAL DATA:  Endotracheal tube and OG tube placement. EXAM: PORTABLE CHEST 1 VIEW COMPARISON:  None. FINDINGS: Endotracheal tube tip 5.1 cm from the carina. Enteric tube tip and side-port below the diaphragm not included in the field of view, likely in the stomach. Multiple overlying monitoring devices. Possible tiny right apical pneumothorax seen only on one view. The lungs are hyperinflated with bronchial thickening. Slight increased perihilar opacities may be bronchitic or congestive. Normal heart size with aortic atherosclerosis. Normal mediastinal contours. No focal airspace disease. No large pleural effusion. Right anterior rib fractures, uncertain acuity. Fracture of eighth rib is minimally displaced, probable ninth and tenth ribs anteriorly. IMPRESSION: 1. Endotracheal tube tip 5.1 cm from the carina. Enteric tube in place with tip and side-port below the diaphragm likely in the stomach not included in the field of view. 2. Possible small right apical pneumothorax. Right anterior rib fractures of uncertain acuity, eighth rib fracture peers minimally displaced. 3. Hyperinflation and bronchial thickening. Increased perihilar opacities may be bronchitic or congestive. These results were called by telephone at the time of interpretation on 10/08/2017 at 11:22 pm to Dr. Jeraldine Loots , who verbally acknowledged these results. Electronically Signed   By: Rubye Oaks M.D.   On: 10/01/2017 23:22     STUDIES:  CXR 6/09 > 1. Endotracheal tube tip 5.1 cm from the carina. Enteric tube in place with tip and side-port below the diaphragm likely in the stomach not included in the  field of view. 2. Possible small right apical pneumothorax. Right anterior rib fractures of uncertain acuity, eighth rib fracture peers minimally displaced. 3. Hyperinflation and bronchial thickening. Increased perihilar opacities may be bronchitic or congestive. CTA Chest 06/10 >> No pulmonary embolus. Small volume of pneumomediastinum and mediastinal hemorrhage, likely secondary to anterior rib and sternal fractures. Tiny medial right pneumothorax versus pneumomediastinum anteriorly. Acute fractures of right and left anterior second through eighth ribs, right ribs 5 and 6 fractures are minimally displaced. Acute nondisplaced sternal body fracture. CT Head/CSpine 6/10 >> Remote appearing right putaminal lacunar infarct. Chronic appearing small vessel ischemic disease. Cervical spondylosis without acute cervical spine fracture or listhesis. ECHO 6/10 >> EEG 6/10 >>   CULTURES: Blood 6/10 >> U/A 6/10 >> Sputum 6/10 >>  ANTIBIOTICS:  SIGNIFICANT EVENTS: 6/09 > Presents to ED  6/10 > on vent, unresponsive. Myoclonic jerking.   LINES/TUBES: ETT 6/09 >> Left IJ 6/10 >> Left Femoral Aline 06/10 >>   DISCUSSION: 72 year old female with COPD presents to ED s/p Asystolic Cardiac Arrest. Downtime estimated about 20 min. Suspect a primary respiratory arrest. Complicated by likely anoxic injury as evidenced by poor neuro exam and associated myoclonus.   ASSESSMENT / PLAN:  Acute on Chronic Hypoxic/Hypercarbic Respiratory Failure  Small Right Apical Pneumothorax with right anterior rib fracture s/p CPR - PTX seems to have resolved on followup imaging. H/O COPD on 2.5L [place]  P:   Vent Support Trend ABG/CXR  VAP Bundle  Scheduled Duoneb, Pulmicort Solumedrol 40mg  q 6 hours.  Cardiac Arrest: Asystole 15 -20 min -Troponin <0.03  H/O Diastolic HF (EF 65-70, G1DD)  P:  Cardiac Monitoring  Maintain MAP >3mmHg TTM Protocol 36  F Trend Troponin  ECHO pending   SUP  P:   NPO PPI     VTE   P:  Trend CBC  Heparin SQ    Leukocytosis  Hypothermia (on arrival 33.5 C)  -U/A negative  Diffuse Rash secondary to medication allergy ?? Noted after Epi P:   Trend WBC and Fever Curve Trend PCT and LA  Monitor off ABX PAN Culture  RVP pending  Benadryl 25 mg q6h   Hyperglycemia    P:   Trend glucose SSI  Cortisol pending   Encephalopathy vs Anoxic Injury  -UDS negative  P:   RASS goal: 0/-1 Code Cool as above  Wean Fentanyl gtt to achieve RASS Start versed infusion and wean propofol to off. Vent dyssynchrony, myoclonus.  EEG pending    She was DNR prior to this event and her sister seems pretty clear she would not want all this. Will pursue further GOC discussion when family arrives today. Limited code.    FAMILY  - Updates: [place] updated by night team. Apparently her brother who is more local will be here today.  - Inter-disciplinary family meet or Palliative Care meeting due by:  10/09/2017  Joneen RoachPaul Ollie Esty, AGACNP-BC [facility] Pager [phone] or [phone]  09/26/2017 10:09 AM

## 2017-10-23 NOTE — Progress Notes (Signed)
eLink Physician-Brief Progress Note Patient Name: Leslie Porter DOB: 10/09/1945 MRN: [id number]   Date of Service  01-13-18  HPI/Events of Note  Episodes of anoxic myoclonus.   eICU Interventions  Will order: 1. Propofol IV infusion. Titrate to RASS = 0 to -1.     Intervention Category Major Interventions: Other:  [name] 01-13-18, 2:12 AM

## 2017-10-23 NOTE — Progress Notes (Signed)
*  PRELIMINARY RESULTS* Echocardiogram 2D Echocardiogram has been performed.  Belva ChimesWendy  Rosalio Catterton 10/18/2017, 10:53 AM

## 2017-10-23 NOTE — Progress Notes (Addendum)
PCCM Interval Note   Spoke with sister Kandace ParkinsGloria Porterfield, patient is no married and has not children. She states that patient has previously voiced extensively that she did NOT want extensive life saving measures. She has a DNR on her refrigerator. Requests that we leave patient intubated until she and brother arrive. While we wait for them to arrive they would not like patient to undergo CPR, Defib/ACLS. Would like to continue current therapies with no escalation. EMR updated.   Jovita KussmaulKatalina Eubanks, AGACNP-BC [facility]  Pgr: [phone]  PCCM Pgr: [phone]

## 2017-10-23 NOTE — Plan of Care (Signed)

## 2017-10-23 NOTE — Procedures (Signed)
Arterial Catheter Insertion Procedure Note Leslie Porter [id number] 08/04/1945  Procedure: Insertion of Arterial Catheter  Indications: Blood pressure monitoring and Frequent blood sampling  Procedure Details Consent: Unable to obtain consent because of emergent medical necessity. Time Out: Verified patient identification, verified procedure, site/side was marked, verified correct patient position, special equipment/implants available, medications/allergies/relevent history reviewed, required imaging and test results available.  Performed  Maximum sterile technique was used including antiseptics, cap, gloves, gown, hand hygiene, mask and sheet. Skin prep: Chlorhexidine; local anesthetic administered 22 gauge catheter was inserted into left femoral artery using the Seldinger technique. ULTRASOUND GUIDANCE USED: YES Evaluation Blood flow good; BP tracing good. Complications: No apparent complications.  Leslie Porter, AGACNP-BC [facility]  Pgr: [phone]  PCCM Pgr: [phone]

## 2017-10-23 DEATH — deceased

## 2017-11-16 IMAGING — DX DG CHEST 1V PORT
1 series · 1 of 1 positions shown · non-contrast
Comparison: Radiographs January 01, 2016.

CLINICAL DATA: Acute respiratory failure with hypoxemia.

EXAM:
PORTABLE CHEST 1 VIEW

[chest ap]
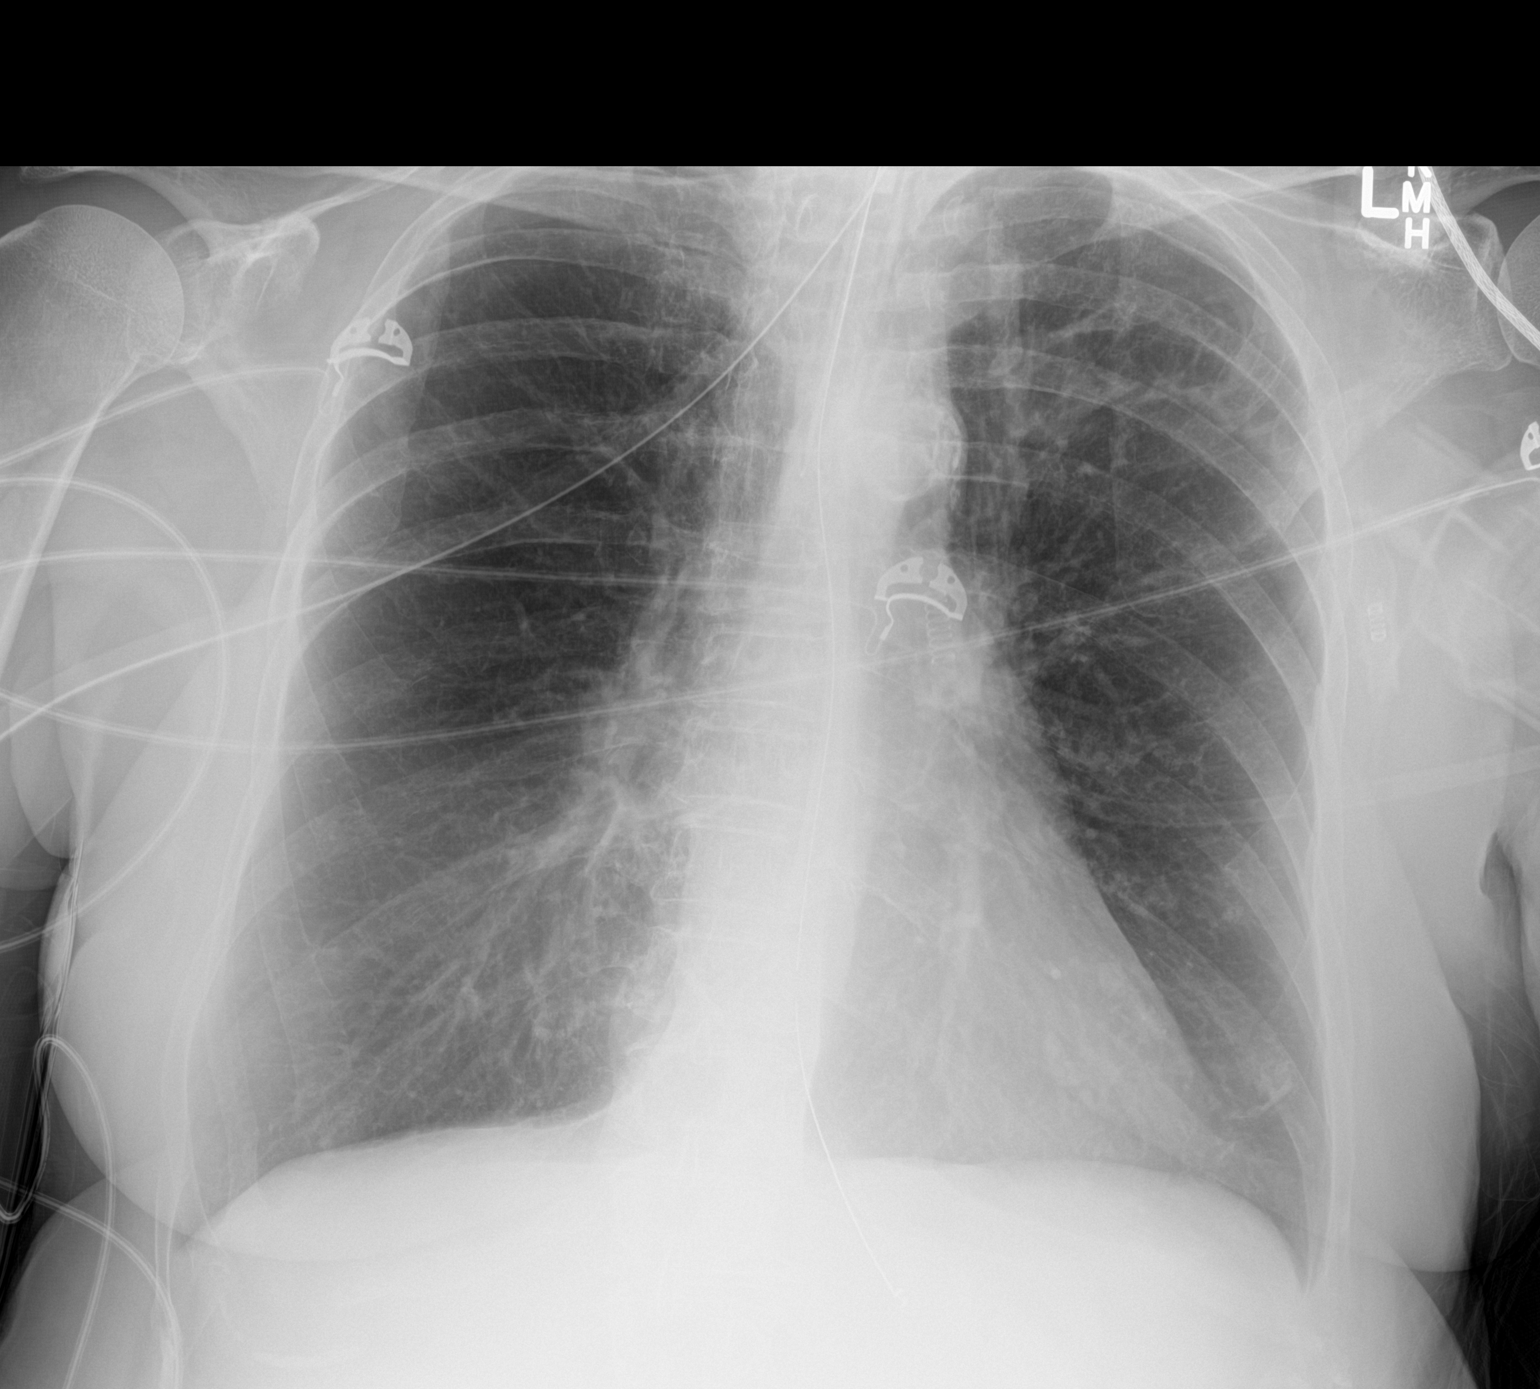

[1 of 1 positions shown; findings below may reference images not displayed]

FINDINGS: The heart size and mediastinal contours are within normal limits.
Both lungs are clear. Endotracheal tube is in grossly good position.
Atherosclerosis of thoracic aorta is noted. Distal tip of
nasogastric tube is seen in proximal stomach. The visualized
skeletal structures are unremarkable.
IMPRESSION: Aortic atherosclerosis. Distal tip of nasogastric tube seen in
proximal stomach. Endotracheal tube in grossly good position. No
acute cardiopulmonary abnormality seen.

## 2017-11-22 IMAGING — DX DG ABDOMEN 1V
1 series · 1 of 1 positions shown · non-contrast
Comparison: Portable exam 4447 hours compared to 01/05/2016

CLINICAL DATA: Nasogastric tube placement

EXAM:
ABDOMEN - 1 VIEW

[abdomen kub]
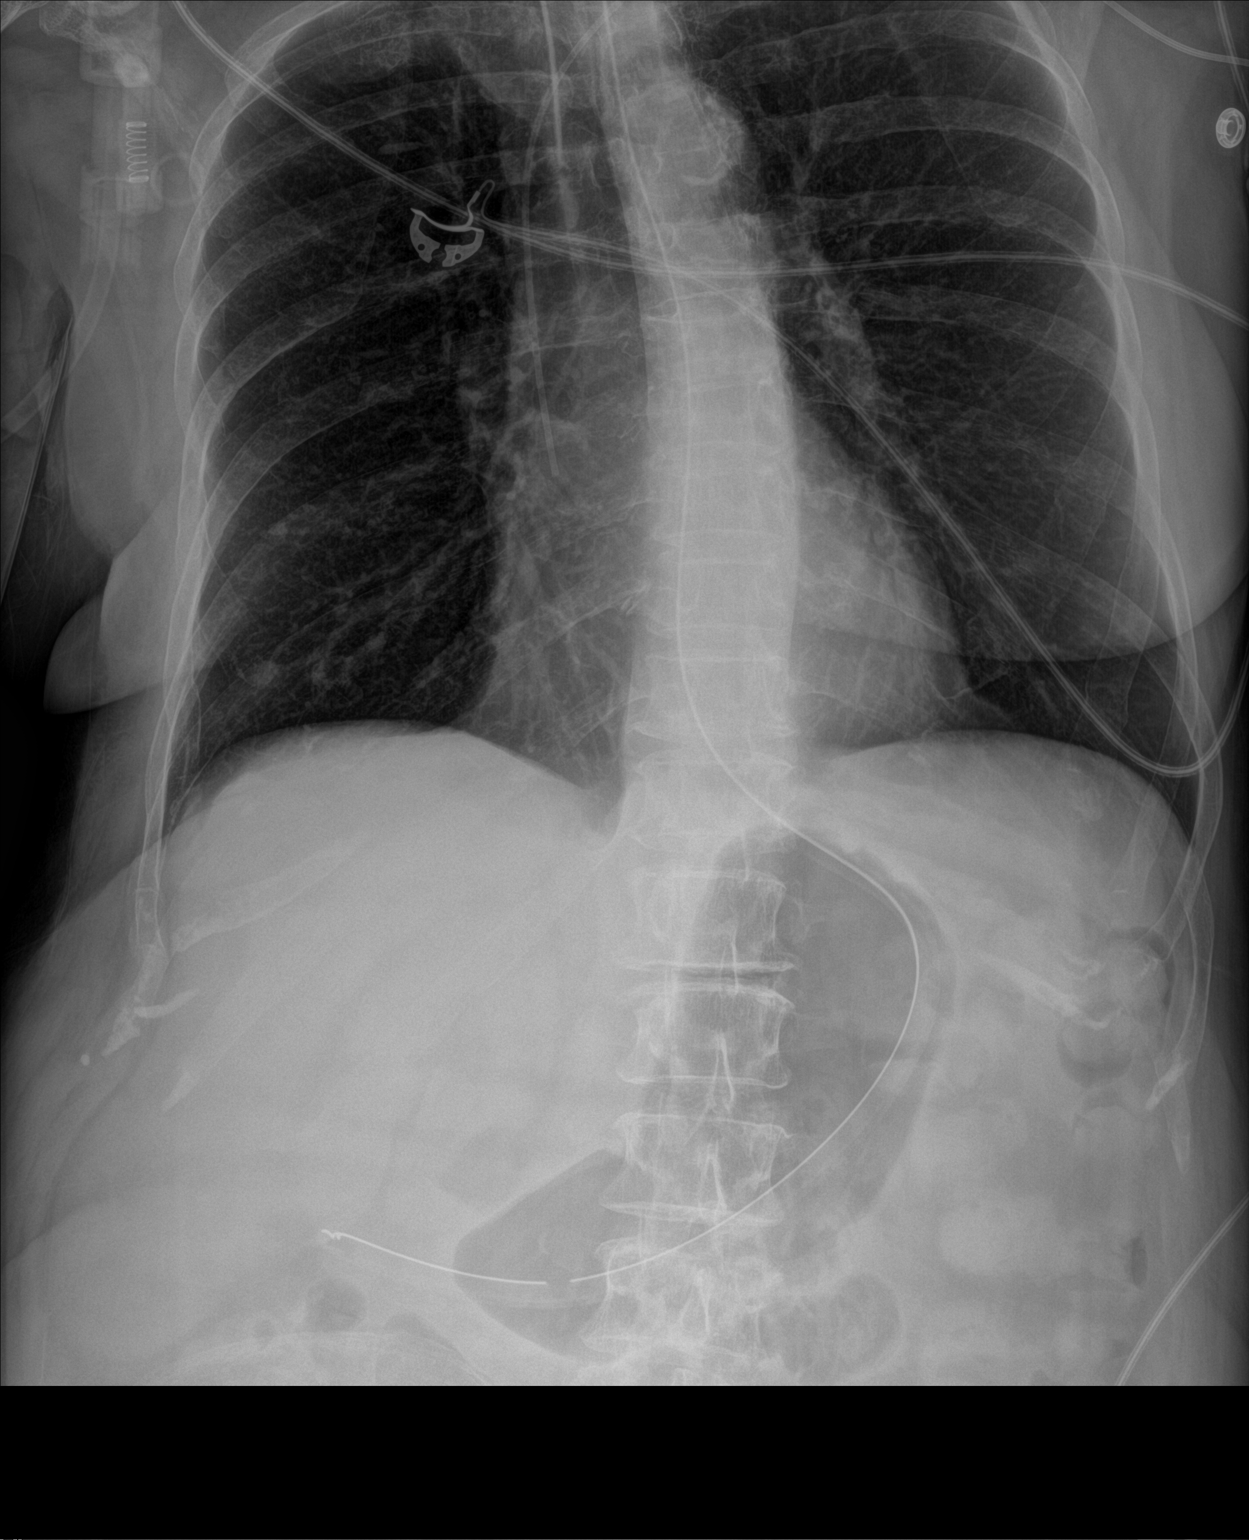

[1 of 1 positions shown; findings below may reference images not displayed]

FINDINGS: Tip of nasogastric tube projects over distal gastric antrum/pylorus.

Lungs appear hyperinflated but clear.

Bowel gas pattern unremarkable.
IMPRESSION: Tip of nasogastric tube projects over distal gastric antrum/pylorus.

## 2017-11-22 IMAGING — DX DG CHEST 1V PORT
1 series · 1 of 1 positions shown · non-contrast
Comparison: 01/07/2016

CLINICAL DATA: Followup acute respiratory failure. Subsequent
encounter. History of asthma/ COPD.

EXAM:
PORTABLE CHEST 1 VIEW

[chest ap]
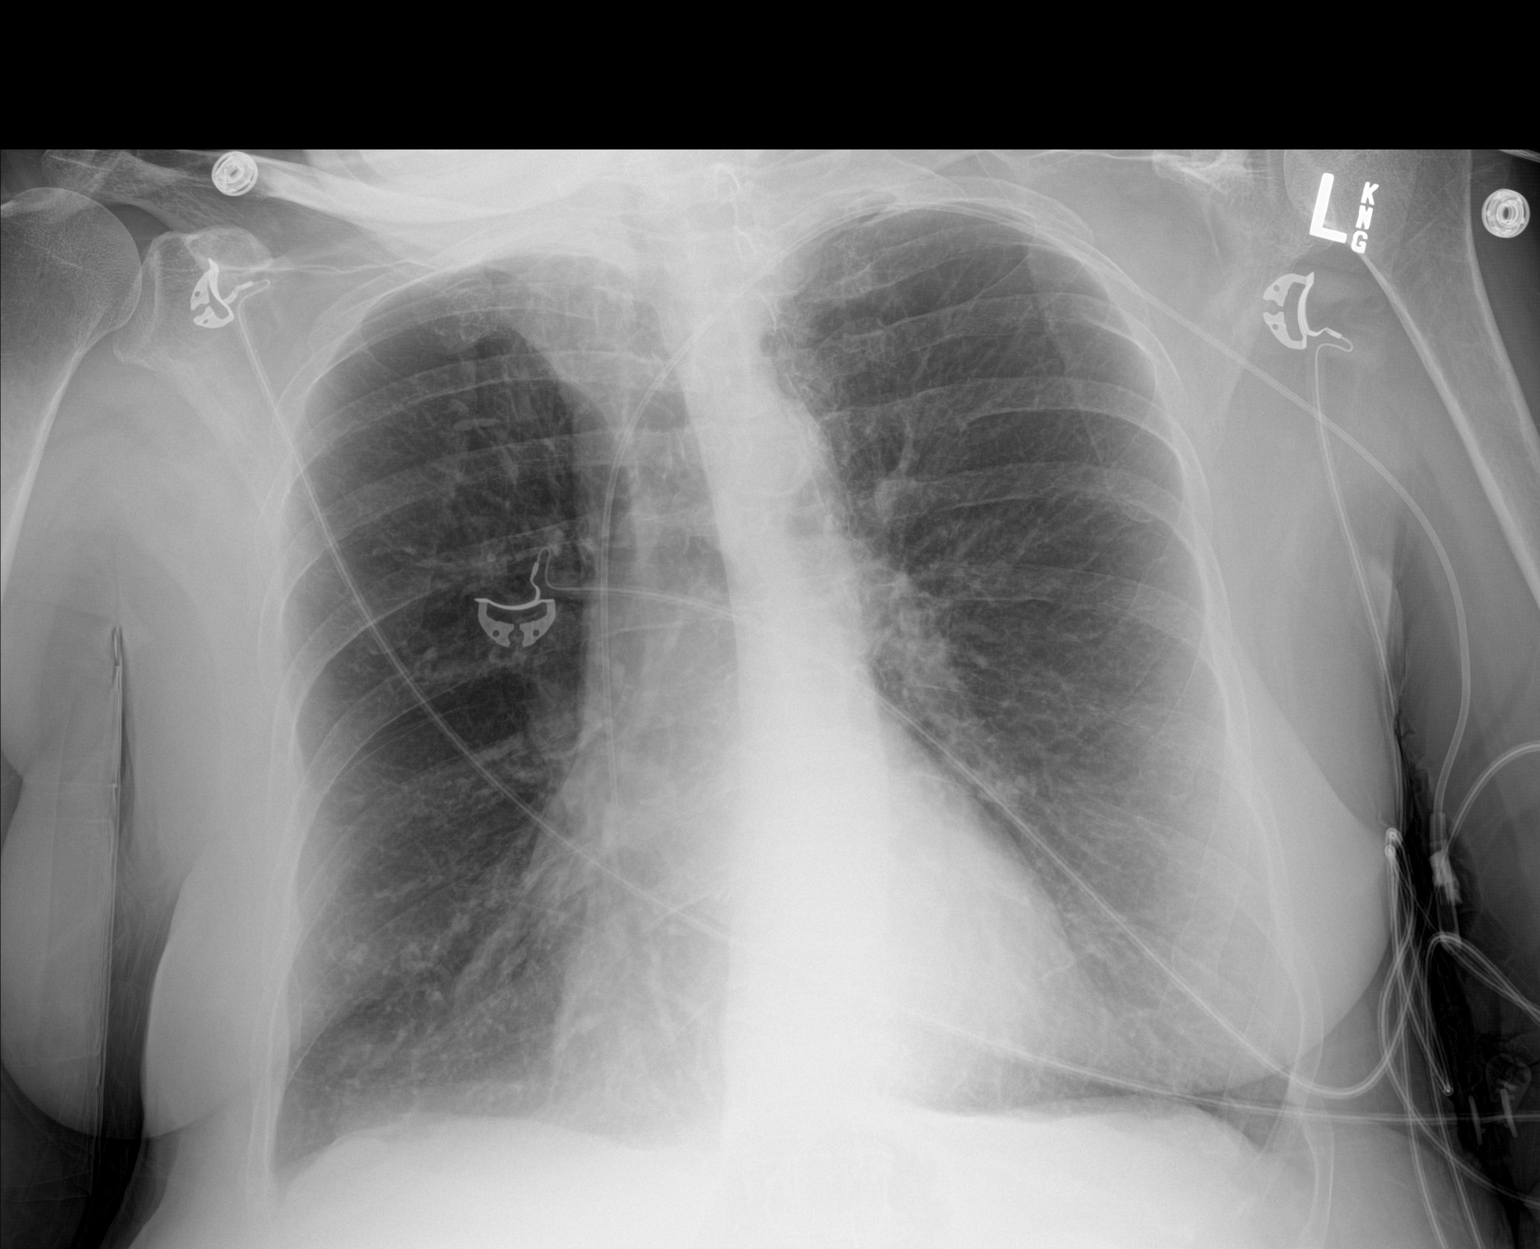

[1 of 1 positions shown; findings below may reference images not displayed]

FINDINGS: Lungs are hyperexpanded. There are prominent bronchovascular
markings. No evidence of pneumonia or edema.

Cardiac silhouette is normal in size. No mediastinal or hilar
masses.

Left PICC is stable and well positioned.

The endotracheal tube and nasal/ orogastric tube have been removed.
IMPRESSION: 1. Status post removal of the endotracheal and nasogastric tubes.
2. No acute cardiopulmonary disease.
3. Findings of COPD.

## 2017-11-22 IMAGING — DX DG CHEST 1V PORT
1 series · 1 of 1 positions shown · non-contrast
Comparison: Portable exam 7272 hours compared to 01/09/2016 at 6349
hours

CLINICAL DATA: Intubation, acute respiratory failure, COPD, asthma,
smoker

EXAM:
PORTABLE CHEST 1 VIEW

[chest ap]
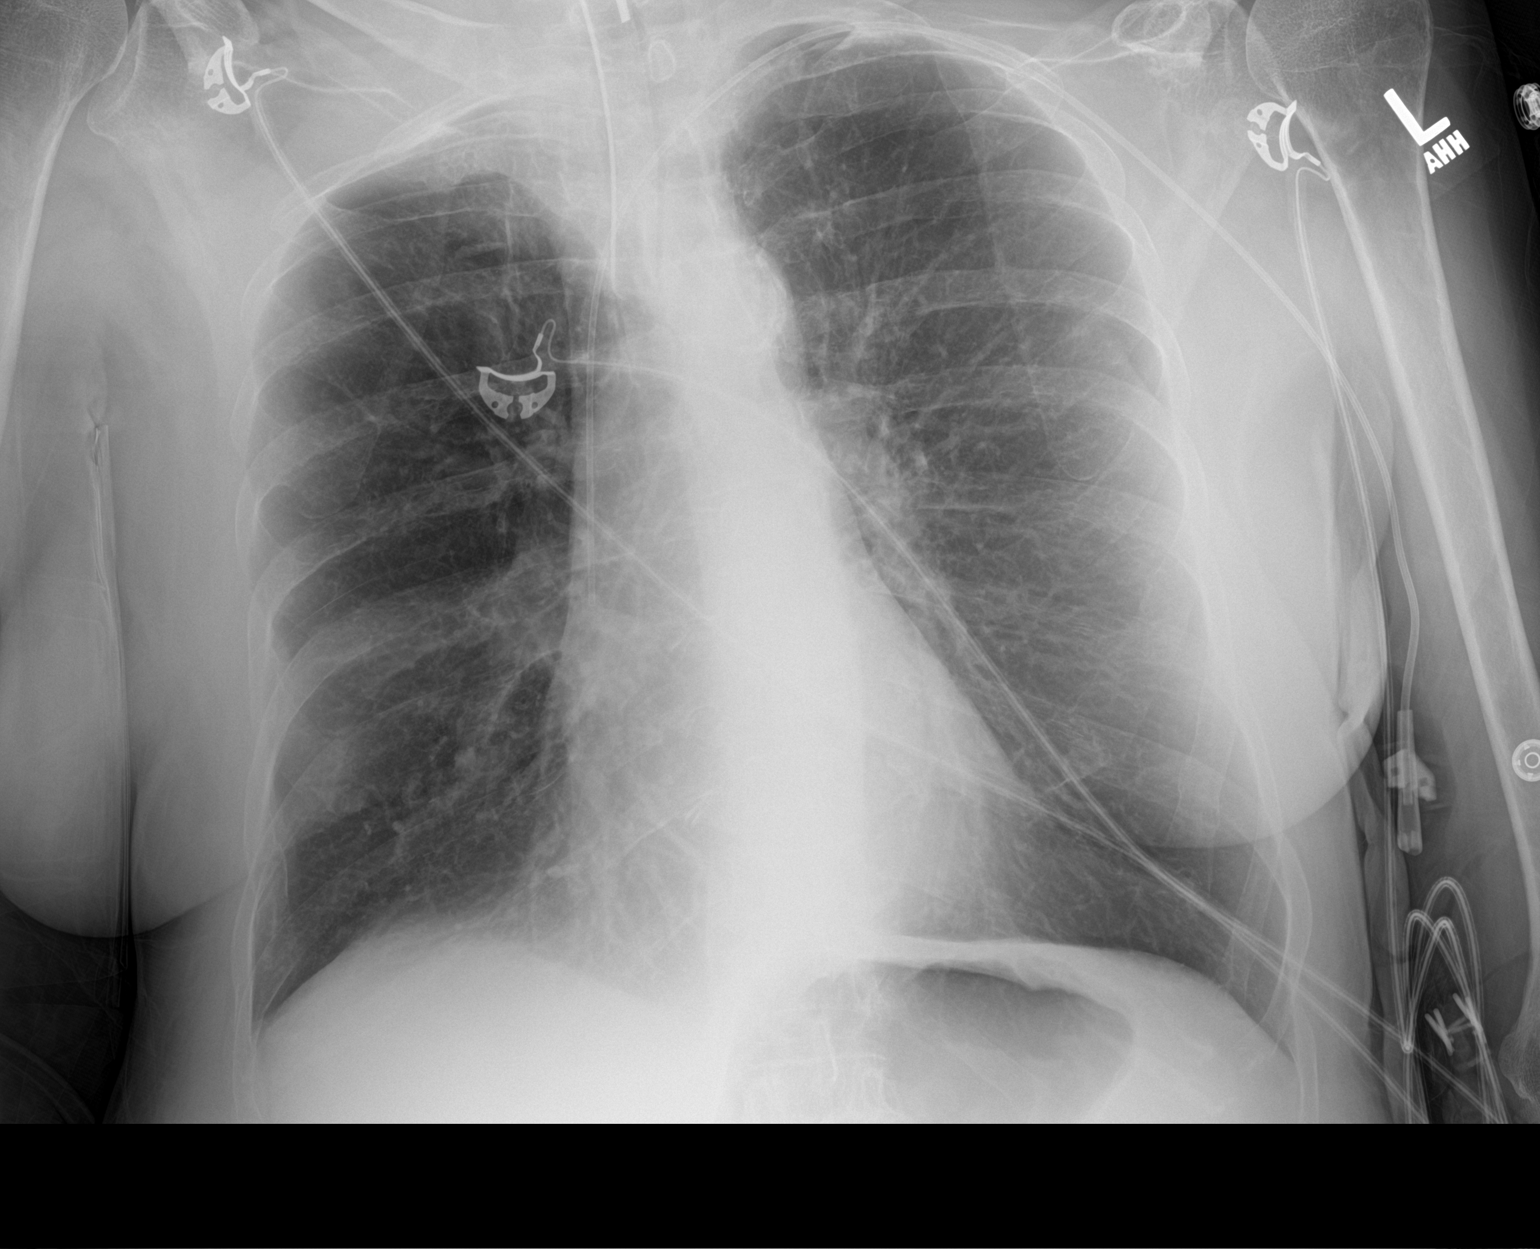

[1 of 1 positions shown; findings below may reference images not displayed]

FINDINGS: Tip of endotracheal tube projects 3.3 cm above carina.

LEFT arm PICC line tip projects over SVC.

Normal heart size, mediastinal contours, and pulmonary vascularity.

Atherosclerotic calcification aorta.

Emphysematous changes compatible with COPD.

No acute infiltrate, pleural effusion, or pneumothorax.

Bones demineralized.
IMPRESSION: Satisfactory endotracheal tube position.

COPD changes without acute infiltrate.

Aortic atherosclerosis.

## 2019-01-28 IMAGING — CR DG CHEST 1V PORT
1 series · 1 of 1 positions shown · non-contrast
Comparison: 04/06/2016

CLINICAL DATA: Shortness of breath since [REDACTED], increasing
tonight. History of asthma, COPD, former smoker.

EXAM:
PORTABLE CHEST 1 VIEW

[portable]
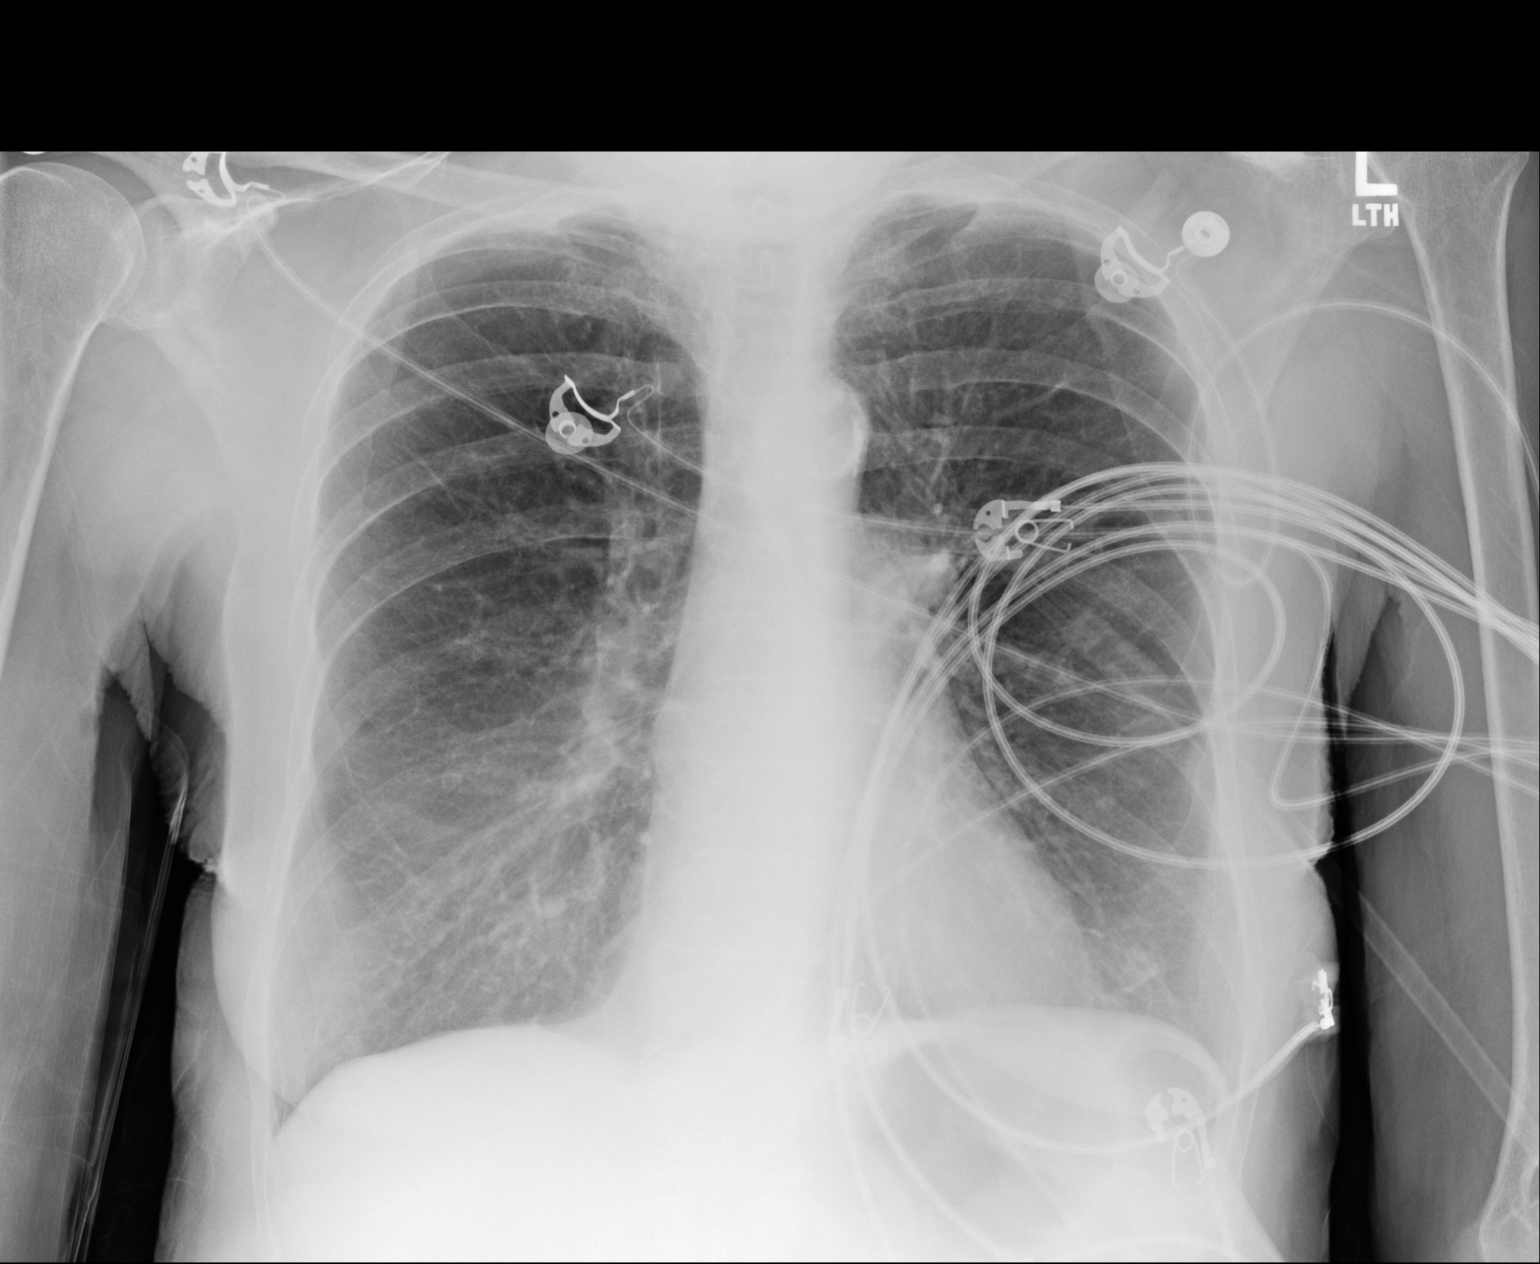

[1 of 1 positions shown; findings below may reference images not displayed]

FINDINGS: Normal heart size and pulmonary vascularity. Lungs appear clear and
expanded. Emphysematous changes in the lungs with scattered fibrosis
and peribronchial thickening suggesting chronic bronchitis. No
blunting of costophrenic angles. No pneumothorax. Calcification of
the aorta.
IMPRESSION: Emphysematous and chronic bronchitic changes in the lungs. No
evidence of active pulmonary disease. Aortic atherosclerosis.
